# Patient Record
Sex: Female | Born: 1985 | Race: Black or African American | Hispanic: No | Marital: Married | Smoking: Former smoker
Health system: Southern US, Community
[De-identification: ages and names within clinical notes are randomized; demographics above are authoritative.]

## PROBLEM LIST (undated history)

## (undated) ENCOUNTER — Inpatient Hospital Stay (HOSPITAL_COMMUNITY): Payer: Self-pay

## (undated) DIAGNOSIS — G35 Multiple sclerosis: Secondary | ICD-10-CM

## (undated) DIAGNOSIS — F329 Major depressive disorder, single episode, unspecified: Secondary | ICD-10-CM

## (undated) DIAGNOSIS — F32A Depression, unspecified: Secondary | ICD-10-CM

## (undated) HISTORY — PX: OTHER SURGICAL HISTORY: SHX169

---
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
## 2004-07-26 ENCOUNTER — Inpatient Hospital Stay (HOSPITAL_COMMUNITY): Admission: AD | Admit: 2004-07-26 | Discharge: 2004-07-29 | Payer: Self-pay | Admitting: Obstetrics

## 2004-08-03 ENCOUNTER — Ambulatory Visit: Admission: RE | Admit: 2004-08-03 | Discharge: 2004-08-03 | Payer: Self-pay | Admitting: Obstetrics

## 2004-09-03 ENCOUNTER — Encounter: Admission: RE | Admit: 2004-09-03 | Discharge: 2004-10-03 | Payer: Self-pay | Admitting: Obstetrics

## 2004-10-04 ENCOUNTER — Encounter: Admission: RE | Admit: 2004-10-04 | Discharge: 2004-11-03 | Payer: Self-pay | Admitting: Obstetrics

## 2004-12-02 ENCOUNTER — Encounter: Admission: RE | Admit: 2004-12-02 | Discharge: 2005-01-01 | Payer: Self-pay | Admitting: Obstetrics

## 2005-02-01 ENCOUNTER — Encounter: Admission: RE | Admit: 2005-02-01 | Discharge: 2005-03-03 | Payer: Self-pay | Admitting: Obstetrics

## 2006-05-20 ENCOUNTER — Emergency Department (HOSPITAL_COMMUNITY): Admission: EM | Admit: 2006-05-20 | Discharge: 2006-05-21 | Payer: Self-pay | Admitting: Emergency Medicine

## 2006-05-20 ENCOUNTER — Emergency Department (HOSPITAL_COMMUNITY): Admission: EM | Admit: 2006-05-20 | Discharge: 2006-05-20 | Payer: Self-pay | Admitting: Emergency Medicine

## 2006-07-04 ENCOUNTER — Inpatient Hospital Stay (HOSPITAL_COMMUNITY): Admission: AD | Admit: 2006-07-04 | Discharge: 2006-07-04 | Payer: Self-pay | Admitting: Obstetrics

## 2007-02-02 ENCOUNTER — Inpatient Hospital Stay (HOSPITAL_COMMUNITY): Admission: AD | Admit: 2007-02-02 | Discharge: 2007-02-02 | Payer: Self-pay | Admitting: Obstetrics

## 2007-02-03 ENCOUNTER — Inpatient Hospital Stay (HOSPITAL_COMMUNITY): Admission: AD | Admit: 2007-02-03 | Discharge: 2007-02-03 | Payer: Self-pay | Admitting: Obstetrics

## 2007-02-05 ENCOUNTER — Inpatient Hospital Stay (HOSPITAL_COMMUNITY): Admission: AD | Admit: 2007-02-05 | Discharge: 2007-02-07 | Payer: Self-pay | Admitting: Obstetrics

## 2007-08-20 ENCOUNTER — Emergency Department (HOSPITAL_COMMUNITY): Admission: EM | Admit: 2007-08-20 | Discharge: 2007-08-21 | Payer: Self-pay | Admitting: Emergency Medicine

## 2007-12-06 ENCOUNTER — Emergency Department (HOSPITAL_COMMUNITY): Admission: EM | Admit: 2007-12-06 | Discharge: 2007-12-06 | Payer: Self-pay | Admitting: Family Medicine

## 2008-01-07 ENCOUNTER — Inpatient Hospital Stay (HOSPITAL_COMMUNITY): Admission: AD | Admit: 2008-01-07 | Discharge: 2008-01-07 | Payer: Self-pay | Admitting: Obstetrics

## 2008-02-26 ENCOUNTER — Inpatient Hospital Stay (HOSPITAL_COMMUNITY): Admission: AD | Admit: 2008-02-26 | Discharge: 2008-02-26 | Payer: Self-pay | Admitting: Obstetrics

## 2008-04-16 ENCOUNTER — Inpatient Hospital Stay (HOSPITAL_COMMUNITY): Admission: AD | Admit: 2008-04-16 | Discharge: 2008-04-16 | Payer: Self-pay | Admitting: Obstetrics

## 2008-07-05 ENCOUNTER — Inpatient Hospital Stay (HOSPITAL_COMMUNITY): Admission: AD | Admit: 2008-07-05 | Discharge: 2008-07-07 | Payer: Self-pay | Admitting: Obstetrics

## 2008-07-06 ENCOUNTER — Encounter (INDEPENDENT_AMBULATORY_CARE_PROVIDER_SITE_OTHER): Payer: Self-pay | Admitting: Obstetrics

## 2010-04-12 ENCOUNTER — Emergency Department (HOSPITAL_COMMUNITY): Admission: EM | Admit: 2010-04-12 | Discharge: 2010-04-12 | Payer: Self-pay | Admitting: Emergency Medicine

## 2010-05-11 ENCOUNTER — Emergency Department (HOSPITAL_COMMUNITY): Admission: EM | Admit: 2010-05-11 | Discharge: 2010-05-11 | Payer: Self-pay | Admitting: Family Medicine

## 2010-09-06 ENCOUNTER — Emergency Department (HOSPITAL_COMMUNITY)
Admission: EM | Admit: 2010-09-06 | Discharge: 2010-09-06 | Payer: Self-pay | Source: Home / Self Care | Admitting: Emergency Medicine

## 2011-02-16 ENCOUNTER — Inpatient Hospital Stay (INDEPENDENT_AMBULATORY_CARE_PROVIDER_SITE_OTHER)
Admission: RE | Admit: 2011-02-16 | Discharge: 2011-02-16 | Disposition: A | Discharge status: Home / Self Care | Payer: Medicaid Other | Source: Ambulatory Visit | Attending: Emergency Medicine | Admitting: Emergency Medicine

## 2011-02-16 DIAGNOSIS — M549 Dorsalgia, unspecified: Secondary | ICD-10-CM

## 2011-02-16 DIAGNOSIS — M79609 Pain in unspecified limb: Secondary | ICD-10-CM

## 2011-02-16 DIAGNOSIS — R300 Dysuria: Secondary | ICD-10-CM

## 2011-02-16 LAB — HEPATITIS C ANTIBODY: HCV Ab: NEGATIVE

## 2011-02-16 LAB — HEPATIC FUNCTION PANEL
ALT: 17 U/L (ref 0–35)
AST: 15 U/L (ref 0–37)
Albumin: 3.6 g/dL (ref 3.5–5.2)
Total Protein: 7.4 g/dL (ref 6.0–8.3)

## 2011-02-16 LAB — POCT URINALYSIS DIP (DEVICE)
Hgb urine dipstick: NEGATIVE
Nitrite: NEGATIVE
Specific Gravity, Urine: >=1.03 (ref 1.005–1.030)
pH: 6.5 (ref 5.0–8.0)

## 2011-02-16 LAB — URINALYSIS, MICROSCOPIC ONLY
Glucose, UA: NEGATIVE mg/dL
Hgb urine dipstick: NEGATIVE
Leukocytes, UA: NEGATIVE
Specific Gravity, Urine: 1.021 (ref 1.005–1.030)
pH: 6.5 (ref 5.0–8.0)

## 2011-05-02 LAB — URINALYSIS, ROUTINE W REFLEX MICROSCOPIC
Bilirubin Urine: NEGATIVE
Glucose, UA: NEGATIVE
Protein, ur: NEGATIVE
Specific Gravity, Urine: 1.028
Urobilinogen, UA: 1

## 2011-05-02 LAB — RPR: RPR Ser Ql: NONREACTIVE

## 2011-05-02 LAB — URINE MICROSCOPIC-ADD ON

## 2011-05-02 LAB — WET PREP, GENITAL
Trich, Wet Prep: NONE SEEN
Yeast Wet Prep HPF POC: NONE SEEN

## 2011-05-02 LAB — GC/CHLAMYDIA PROBE AMP, GENITAL
Chlamydia, DNA Probe: NEGATIVE
GC Probe Amp, Genital: NEGATIVE

## 2011-05-02 LAB — POCT PREGNANCY, URINE
Operator id: 161631
Preg Test, Ur: NEGATIVE

## 2011-05-07 LAB — POCT PREGNANCY, URINE: Preg Test, Ur: POSITIVE

## 2011-05-07 LAB — URINE CULTURE
Colony Count: NO GROWTH
Culture: NO GROWTH

## 2011-05-07 LAB — POCT URINALYSIS DIP (DEVICE)
Glucose, UA: NEGATIVE
Hgb urine dipstick: NEGATIVE
Specific Gravity, Urine: 1.02
pH: 7.5

## 2011-05-10 LAB — URINALYSIS, ROUTINE W REFLEX MICROSCOPIC
Ketones, ur: NEGATIVE
Nitrite: NEGATIVE
Protein, ur: NEGATIVE
Urobilinogen, UA: 0.2

## 2011-05-14 LAB — CBC
HCT: 29.2 — ABNORMAL LOW
MCHC: 33.6
Platelets: 146 — ABNORMAL LOW
RBC: 2.99 — ABNORMAL LOW
RDW: 14.4
WBC: 7.9
WBC: 9.2

## 2011-05-14 LAB — RPR: RPR Ser Ql: NONREACTIVE

## 2011-05-15 LAB — URINALYSIS, ROUTINE W REFLEX MICROSCOPIC
Glucose, UA: NEGATIVE
Hgb urine dipstick: NEGATIVE
Ketones, ur: >80 — AB
Protein, ur: NEGATIVE
Urobilinogen, UA: 1

## 2011-05-15 LAB — WET PREP, GENITAL
Clue Cells Wet Prep HPF POC: NONE SEEN
Trich, Wet Prep: NONE SEEN

## 2011-05-19 ENCOUNTER — Emergency Department (HOSPITAL_COMMUNITY)
Admission: EM | Admit: 2011-05-19 | Discharge: 2011-05-19 | Disposition: A | Discharge status: Home / Self Care | Payer: Medicaid Other | Attending: Emergency Medicine | Admitting: Emergency Medicine

## 2011-05-19 ENCOUNTER — Emergency Department (HOSPITAL_COMMUNITY): Payer: Medicaid Other

## 2011-05-19 DIAGNOSIS — Y92009 Unspecified place in unspecified non-institutional (private) residence as the place of occurrence of the external cause: Secondary | ICD-10-CM | POA: Insufficient documentation

## 2011-05-19 DIAGNOSIS — W268XXA Contact with other sharp object(s), not elsewhere classified, initial encounter: Secondary | ICD-10-CM | POA: Insufficient documentation

## 2011-05-19 DIAGNOSIS — F3130 Bipolar disorder, current episode depressed, mild or moderate severity, unspecified: Secondary | ICD-10-CM | POA: Insufficient documentation

## 2011-05-19 DIAGNOSIS — S61409A Unspecified open wound of unspecified hand, initial encounter: Secondary | ICD-10-CM | POA: Insufficient documentation

## 2011-05-22 ENCOUNTER — Inpatient Hospital Stay (INDEPENDENT_AMBULATORY_CARE_PROVIDER_SITE_OTHER)
Admission: RE | Admit: 2011-05-22 | Discharge: 2011-05-22 | Disposition: A | Discharge status: Home / Self Care | Payer: Medicaid Other | Source: Ambulatory Visit | Attending: Family Medicine | Admitting: Family Medicine

## 2011-05-22 DIAGNOSIS — S61209A Unspecified open wound of unspecified finger without damage to nail, initial encounter: Secondary | ICD-10-CM

## 2011-05-29 LAB — CBC
HCT: 26.5 — ABNORMAL LOW
Hemoglobin: 9.9 — ABNORMAL LOW
MCHC: 33.2
MCV: 86
RBC: 3.08 — ABNORMAL LOW
RBC: 3.53 — ABNORMAL LOW
WBC: 9.3

## 2012-06-07 ENCOUNTER — Inpatient Hospital Stay (HOSPITAL_COMMUNITY)
Admission: AD | Admit: 2012-06-07 | Discharge: 2012-06-07 | Disposition: A | Discharge status: Home / Self Care | Payer: Medicaid Other | Source: Ambulatory Visit | Attending: Obstetrics | Admitting: Obstetrics

## 2012-06-07 ENCOUNTER — Inpatient Hospital Stay (HOSPITAL_COMMUNITY): Payer: Medicaid Other

## 2012-06-07 ENCOUNTER — Encounter (HOSPITAL_COMMUNITY): Payer: Self-pay | Admitting: *Deleted

## 2012-06-07 DIAGNOSIS — R109 Unspecified abdominal pain: Secondary | ICD-10-CM | POA: Insufficient documentation

## 2012-06-07 DIAGNOSIS — O99891 Other specified diseases and conditions complicating pregnancy: Secondary | ICD-10-CM | POA: Insufficient documentation

## 2012-06-07 DIAGNOSIS — M545 Low back pain, unspecified: Secondary | ICD-10-CM | POA: Insufficient documentation

## 2012-06-07 DIAGNOSIS — Z3490 Encounter for supervision of normal pregnancy, unspecified, unspecified trimester: Secondary | ICD-10-CM

## 2012-06-07 DIAGNOSIS — Z331 Pregnant state, incidental: Secondary | ICD-10-CM

## 2012-06-07 LAB — URINALYSIS, ROUTINE W REFLEX MICROSCOPIC
Bilirubin Urine: NEGATIVE
Glucose, UA: NEGATIVE mg/dL
Hgb urine dipstick: NEGATIVE
Ketones, ur: 15 mg/dL — AB
pH: 8.5 — ABNORMAL HIGH (ref 5.0–8.0)

## 2012-06-07 LAB — WET PREP, GENITAL
Trich, Wet Prep: NONE SEEN
Yeast Wet Prep HPF POC: NONE SEEN

## 2012-06-07 NOTE — MAU Note (Signed)
Pt c/o apin in her lower abd and back  That she has had off and on for the past 2 weeks. Worse at night. Denies vag bleeding or discharge. Implanade taken out in march still did not have a period since it has been taken out. Took HPT and it was positive.

## 2012-06-07 NOTE — MAU Provider Note (Signed)
History     CSN: [id number]  Arrival date & time 06/07/12  1407   None     Chief Complaint  Patient presents with  . Abdominal Pain    (Consider location/radiation/quality/duration/timing/severity/associated sxs/prior treatment) HPI Alicia Barker is a 26 y.o. [id number]. She had Nexplanon removed in March, no period yet. Has been having low abd and low back pain off/on x 2 wks, did home UPT, was positive.  No bleeding, change in discharge, UTI S&S or GI changes.  History reviewed. No pertinent past medical history.  History reviewed. No pertinent past surgical history.  History reviewed. No pertinent family history.  History  Substance Use Topics  . Smoking status: Former Games developer  . Smokeless tobacco: Not on file  . Alcohol Use:     OB History    Grav Para Term Preterm Abortions TAB SAB Ect Mult Living   5 4 3 1  0 0 0 0 0 4      Review of Systems  Constitutional: Positive for fatigue. Negative for fever and chills.  Gastrointestinal: Positive for abdominal pain.  Genitourinary: Negative for dysuria, urgency, frequency, vaginal bleeding and vaginal discharge.  Musculoskeletal: Positive for back pain.    Allergies  Penicillins  Home Medications  No current outpatient prescriptions on file.  BP 117/69  Pulse 72  Temp 97.9 F (36.6 C) (Oral)  Resp 18  Ht 5\' 6"  (1.676 m)  Wt 193 lb 3.2 oz (87.635 kg)  BMI 31.18 kg/m2  Physical Exam  Constitutional: She is oriented to person, place, and time. She appears well-developed and well-nourished.  Abdominal: There is tenderness. There is no rebound and no guarding.  Musculoskeletal: Normal range of motion.  Neurological: She is alert and oriented to person, place, and time.  Skin: Skin is warm and dry.  Psychiatric: She has a normal mood and affect. Her behavior is normal.    ED Course  Procedures (including critical care time)  Labs Reviewed  URINALYSIS, ROUTINE W REFLEX MICROSCOPIC - Abnormal; Notable for  the following:    APPearance HAZY (*)     pH 8.5 (*)     Ketones, ur 15 (*)     All other components within normal limits  POCT PREGNANCY, URINE - Abnormal; Notable for the following:    Preg Test, Ur POSITIVE (*)     All other components within normal limits   No results found. Results for orders placed during the hospital encounter of 06/07/12 (from the past 24 hour(s))  URINALYSIS, ROUTINE W REFLEX MICROSCOPIC     Status: Abnormal   Collection Time   06/07/12  2:13 PM      Component Value Range   Color, Urine YELLOW  YELLOW   APPearance HAZY (*) CLEAR   Specific Gravity, Urine 1.020  1.005 - 1.030   pH 8.5 (*) 5.0 - 8.0   Glucose, UA NEGATIVE  NEGATIVE mg/dL   Hgb urine dipstick NEGATIVE  NEGATIVE   Bilirubin Urine NEGATIVE  NEGATIVE   Ketones, ur 15 (*) NEGATIVE mg/dL   Protein, ur NEGATIVE  NEGATIVE mg/dL   Urobilinogen, UA 1.0  0.0 - 1.0 mg/dL   Nitrite NEGATIVE  NEGATIVE   Leukocytes, UA NEGATIVE  NEGATIVE  POCT PREGNANCY, URINE     Status: Abnormal   Collection Time   06/07/12  2:32 PM      Component Value Range   Preg Test, Ur POSITIVE (*) NEGATIVE  WET PREP, GENITAL     Status: Abnormal  Collection Time   06/07/12  4:20 PM      Component Value Range   Yeast Wet Prep HPF POC NONE SEEN  NONE SEEN   Trich, Wet Prep NONE SEEN  NONE SEEN   Clue Cells Wet Prep HPF POC FEW (*) NONE SEEN   WBC, Wet Prep HPF POC FEW (*) NONE SEEN   US Ob Limited  06/07/2012  *RADIOLOGY REPORT*  Clinical Data: 26 year old pregnant female with abdominal and pelvic pain.  LIMITED OBSTETRIC ULTRASOUND  Number of Fetuses: 1 Heart Rate: 146 bpm Movement: Present Presentation: Breech Placental Location: Anterior Previa: No Amniotic Fluid (Subjective): Normal  Vertical pocket:  5.6cm  BPD: 3.9cm   18w   0d   EDC: 11/08/2012.  MATERNAL FINDINGS: Cervix: 3.4 cm and closed. Uterus/Adnexae: Ovaries bilaterally are unremarkable. There is no evidence of free fluid or adnexal mass.  IMPRESSION: Single  living intrauterine gestation with estimated gestational age of [redacted] weeks 0 days by this ultrasound.  Recommend followup with non-emergent complete OB 14+ wk US examination for fetal biometric evaluation and anatomic survey if not already performed.   Original Report Authenticated By: Rosendo Gros, M.D.      No diagnosis found. ASSESSMENT:  18 wk pregnancy, nl exam and labs   PLAN:  Call Dr Elsie Stain office for an appt top start prenatal care.  MDM

## 2012-06-09 LAB — GC/CHLAMYDIA PROBE AMP, GENITAL
Chlamydia, DNA Probe: POSITIVE — AB
GC Probe Amp, Genital: POSITIVE — AB

## 2012-06-10 ENCOUNTER — Telehealth (HOSPITAL_COMMUNITY): Payer: Self-pay | Admitting: Obstetrics and Gynecology

## 2012-06-10 NOTE — Telephone Encounter (Signed)
Patient returned call, notified her of positive cultures.  Instructed patient to schedule treatment.  Patient plans to be treated with Dr. Gaynell Face.  Instructed patient to notify her partner for treatment.

## 2012-06-10 NOTE — Telephone Encounter (Signed)
Telephone call to patient regarding positive GC and chlamydia cultures, patient not in, left message for her to call.  Patient has not been treated and will need referral to [facility] STD clinic at [phone].  Report faxed to health department.

## 2012-08-12 NOTE — L&D Delivery Note (Signed)
Delivery Note At 12:34 AM a viable female was delivered via Vaginal, Spontaneous Delivery (Presentation: ; Occiput Anterior).  APGAR: , ; weight .   Placenta status: Intact, Spontaneous.  Cord:  with the following complications: .  Cord pH: not done  Anesthesia: Epidural  Episiotomy: None Lacerations: None Suture Repair: 2.0 Est. Blood Loss (mL): 250  Mom to postpartum.  Baby to nursery-stable.  MARSHALL,BERNARD A 10/30/2012, 12:46 AM

## 2012-08-25 LAB — OB RESULTS CONSOLE ANTIBODY SCREEN: Antibody Screen: NEGATIVE

## 2012-08-25 LAB — OB RESULTS CONSOLE HEPATITIS B SURFACE ANTIGEN: Hepatitis B Surface Ag: NEGATIVE

## 2012-08-25 LAB — OB RESULTS CONSOLE ABO/RH: RH Type: POSITIVE

## 2012-08-25 LAB — OB RESULTS CONSOLE RPR: RPR: NONREACTIVE

## 2012-10-29 ENCOUNTER — Inpatient Hospital Stay (HOSPITAL_COMMUNITY): Payer: Medicaid Other | Admitting: Anesthesiology

## 2012-10-29 ENCOUNTER — Encounter (HOSPITAL_COMMUNITY): Payer: Self-pay | Admitting: Anesthesiology

## 2012-10-29 ENCOUNTER — Encounter (HOSPITAL_COMMUNITY): Payer: Self-pay | Admitting: *Deleted

## 2012-10-29 ENCOUNTER — Inpatient Hospital Stay (HOSPITAL_COMMUNITY)
Admission: AD | Admit: 2012-10-29 | Discharge: 2012-10-31 | DRG: 775 | Disposition: A | Discharge status: Home / Self Care | Payer: Medicaid Other | Source: Ambulatory Visit | Attending: Obstetrics | Admitting: Obstetrics

## 2012-10-29 DIAGNOSIS — Z22330 Carrier of Group B streptococcus: Secondary | ICD-10-CM

## 2012-10-29 DIAGNOSIS — O99892 Other specified diseases and conditions complicating childbirth: Principal | ICD-10-CM | POA: Diagnosis present

## 2012-10-29 LAB — CBC
Hemoglobin: 10.5 g/dL — ABNORMAL LOW (ref 12.0–15.0)
MCH: 29.9 pg (ref 26.0–34.0)
MCHC: 34.2 g/dL (ref 30.0–36.0)
Platelets: 158 10*3/uL (ref 150–400)

## 2012-10-29 MED ORDER — CITRIC ACID-SODIUM CITRATE 334-500 MG/5ML PO SOLN: 30.0000 mL | ORAL | Status: DC | PRN

## 2012-10-29 MED ORDER — ACETAMINOPHEN 325 MG PO TABS: 650.0000 mg | ORAL_TABLET | ORAL | Status: DC | PRN

## 2012-10-29 MED ORDER — OXYCODONE-ACETAMINOPHEN 5-325 MG PO TABS
1.0000 | ORAL_TABLET | ORAL | Status: DC | PRN
Start: 2012-10-29 — End: 2012-10-30

## 2012-10-29 MED ORDER — OXYTOCIN 40 UNITS IN LACTATED RINGERS INFUSION - SIMPLE MED
62.5000 mL/h | INTRAVENOUS | Status: DC
  Administered 2012-10-30: 62.5 mL/h via INTRAVENOUS
  Filled 2012-10-29: qty 1000

## 2012-10-29 MED ORDER — BUTORPHANOL TARTRATE 1 MG/ML IJ SOLN: 1.0000 mg | INTRAMUSCULAR | Status: DC | PRN

## 2012-10-29 MED ORDER — LACTATED RINGERS IV SOLN: 500.0000 mL | INTRAVENOUS | Status: DC | PRN

## 2012-10-29 MED ORDER — LIDOCAINE HCL (PF) 1 % IJ SOLN
30.0000 mL | INTRAMUSCULAR | Status: DC | PRN
  Filled 2012-10-29: qty 30

## 2012-10-29 MED ORDER — EPHEDRINE 5 MG/ML INJ
10.0000 mg | INTRAVENOUS | Status: DC | PRN
  Filled 2012-10-29: qty 4

## 2012-10-29 MED ORDER — IBUPROFEN 600 MG PO TABS: 600.0000 mg | ORAL_TABLET | Freq: Four times a day (QID) | ORAL | Status: DC | PRN

## 2012-10-29 MED ORDER — DIPHENHYDRAMINE HCL 50 MG/ML IJ SOLN: 12.5000 mg | INTRAMUSCULAR | Status: DC | PRN

## 2012-10-29 MED ORDER — FENTANYL 2.5 MCG/ML BUPIVACAINE 1/10 % EPIDURAL INFUSION (WH - ANES)
INTRAMUSCULAR | Status: DC | PRN
  Administered 2012-10-29: 14 mL/h via EPIDURAL

## 2012-10-29 MED ORDER — OXYTOCIN BOLUS FROM INFUSION: 500.0000 mL | INTRAVENOUS | Status: DC

## 2012-10-29 MED ORDER — ONDANSETRON HCL 4 MG/2ML IJ SOLN: 4.0000 mg | Freq: Four times a day (QID) | INTRAMUSCULAR | Status: DC | PRN

## 2012-10-29 MED ORDER — FENTANYL 2.5 MCG/ML BUPIVACAINE 1/10 % EPIDURAL INFUSION (WH - ANES)
14.0000 mL/h | INTRAMUSCULAR | Status: DC | PRN
  Filled 2012-10-29: qty 125

## 2012-10-29 MED ORDER — EPHEDRINE 5 MG/ML INJ: 10.0000 mg | INTRAVENOUS | Status: DC | PRN

## 2012-10-29 MED ORDER — LACTATED RINGERS IV SOLN
INTRAVENOUS | Status: DC
  Administered 2012-10-29: 23:00:00 via INTRAVENOUS

## 2012-10-29 MED ORDER — LIDOCAINE HCL (PF) 1 % IJ SOLN
INTRAMUSCULAR | Status: DC | PRN
  Administered 2012-10-29: 8 mL
  Administered 2012-10-29: 9 mL

## 2012-10-29 MED ORDER — LACTATED RINGERS IV SOLN
500.0000 mL | Freq: Once | INTRAVENOUS | Status: AC
  Administered 2012-10-29: 500 mL via INTRAVENOUS

## 2012-10-29 MED ORDER — CLINDAMYCIN PHOSPHATE 900 MG/50ML IV SOLN
900.0000 mg | Freq: Three times a day (TID) | INTRAVENOUS | Status: DC
  Administered 2012-10-29: 900 mg via INTRAVENOUS
  Filled 2012-10-29 (×3): qty 50

## 2012-10-29 MED ORDER — PHENYLEPHRINE 40 MCG/ML (10ML) SYRINGE FOR IV PUSH (FOR BLOOD PRESSURE SUPPORT)
80.0000 ug | PREFILLED_SYRINGE | INTRAVENOUS | Status: DC | PRN
  Filled 2012-10-29: qty 5

## 2012-10-29 MED ORDER — PHENYLEPHRINE 40 MCG/ML (10ML) SYRINGE FOR IV PUSH (FOR BLOOD PRESSURE SUPPORT): 80.0000 ug | PREFILLED_SYRINGE | INTRAVENOUS | Status: DC | PRN

## 2012-10-29 NOTE — Anesthesia Preprocedure Evaluation (Signed)
Anesthesia Evaluation  Patient identified by MRN, date of birth, ID band Patient awake    Reviewed: Allergy & Precautions, H&P , NPO status , Patient's Chart, lab work & pertinent test results  Airway Mallampati: II TM Distance: >3 FB Neck ROM: full    Dental no notable dental hx.    Pulmonary neg pulmonary ROS,    Pulmonary exam normal       Cardiovascular negative cardio ROS      Neuro/Psych negative neurological ROS  negative psych ROS   GI/Hepatic negative GI ROS, Neg liver ROS,   Endo/Other  Morbid obesity  Renal/GU negative Renal ROS  negative genitourinary   Musculoskeletal   Abdominal Normal abdominal exam  (+)   Peds negative pediatric ROS (+)  Hematology negative hematology ROS (+)   Anesthesia Other Findings   Reproductive/Obstetrics (+) Pregnancy                           Anesthesia Physical Anesthesia Plan  ASA: III  Anesthesia Plan: Epidural   Post-op Pain Management:    Induction:   Airway Management Planned:   Additional Equipment:   Intra-op Plan:   Post-operative Plan:   Informed Consent: I have reviewed the patients History and Physical, chart, labs and discussed the procedure including the risks, benefits and alternatives for the proposed anesthesia with the patient or authorized representative who has indicated his/her understanding and acceptance.     Plan Discussed with:   Anesthesia Plan Comments:         Anesthesia Quick Evaluation

## 2012-10-29 NOTE — MAU Note (Signed)
Pt G5 P4 at 38.4wks having contractions x 1 hr.  Seen today in the office SVE 2cm.  Denies bleeding or leaking.  GBS +

## 2012-10-29 NOTE — Anesthesia Procedure Notes (Signed)
Epidural Patient location during procedure: OB Start time: 10/29/2012 10:36 PM End time: 10/29/2012 10:40 PM  Staffing Anesthesiologist: Sandrea Hughs Performed by: anesthesiologist   Preanesthetic Checklist Completed: patient identified, site marked, surgical consent, pre-op evaluation, timeout performed, IV checked, risks and benefits discussed and monitors and equipment checked  Epidural Patient position: sitting Prep: site prepped and draped and DuraPrep Patient monitoring: continuous pulse ox and blood pressure Approach: midline Injection technique: LOR air  Needle:  Needle type: Tuohy  Needle gauge: 17 G Needle length: 9 cm and 9 Needle insertion depth: 6 cm Catheter type: closed end flexible Catheter size: 19 Gauge Catheter at skin depth: 12 cm Test dose: negative and Other  Assessment Sensory level: T8 Events: blood not aspirated, injection not painful, no injection resistance, negative IV test and no paresthesia  Additional Notes Reason for block:procedure for pain

## 2012-10-30 ENCOUNTER — Encounter (HOSPITAL_COMMUNITY): Payer: Self-pay | Admitting: *Deleted

## 2012-10-30 LAB — CBC
HCT: 29 % — ABNORMAL LOW (ref 36.0–46.0)
Platelets: 152 10*3/uL (ref 150–400)
RBC: 3.3 MIL/uL — ABNORMAL LOW (ref 3.87–5.11)
RDW: 13.5 % (ref 11.5–15.5)
WBC: 10.2 10*3/uL (ref 4.0–10.5)

## 2012-10-30 LAB — ABO/RH: ABO/RH(D): A POS

## 2012-10-30 LAB — RPR: RPR Ser Ql: NONREACTIVE

## 2012-10-30 MED ORDER — PRENATAL MULTIVITAMIN CH
1.0000 | ORAL_TABLET | Freq: Every day | ORAL | Status: DC
  Administered 2012-10-30 – 2012-10-31 (×2): 1 via ORAL
  Filled 2012-10-30 (×2): qty 1

## 2012-10-30 MED ORDER — SIMETHICONE 80 MG PO CHEW: 80.0000 mg | CHEWABLE_TABLET | ORAL | Status: DC | PRN

## 2012-10-30 MED ORDER — SENNOSIDES-DOCUSATE SODIUM 8.6-50 MG PO TABS
2.0000 | ORAL_TABLET | Freq: Every day | ORAL | Status: DC
  Administered 2012-10-30: 2 via ORAL

## 2012-10-30 MED ORDER — ZOLPIDEM TARTRATE 5 MG PO TABS: 5.0000 mg | ORAL_TABLET | Freq: Every evening | ORAL | Status: DC | PRN

## 2012-10-30 MED ORDER — ONDANSETRON HCL 4 MG/2ML IJ SOLN: 4.0000 mg | INTRAMUSCULAR | Status: DC | PRN

## 2012-10-30 MED ORDER — DIPHENHYDRAMINE HCL 25 MG PO CAPS
25.0000 mg | ORAL_CAPSULE | Freq: Four times a day (QID) | ORAL | Status: DC | PRN
  Administered 2012-10-30: 25 mg via ORAL
  Filled 2012-10-30: qty 1

## 2012-10-30 MED ORDER — TETANUS-DIPHTH-ACELL PERTUSSIS 5-2.5-18.5 LF-MCG/0.5 IM SUSP: 0.5000 mL | Freq: Once | INTRAMUSCULAR | Status: DC

## 2012-10-30 MED ORDER — BENZOCAINE-MENTHOL 20-0.5 % EX AERO
1.0000 "application " | INHALATION_SPRAY | CUTANEOUS | Status: DC | PRN
  Administered 2012-10-30: 1 via TOPICAL
  Filled 2012-10-30 (×2): qty 56

## 2012-10-30 MED ORDER — LANOLIN HYDROUS EX OINT: TOPICAL_OINTMENT | CUTANEOUS | Status: DC | PRN

## 2012-10-30 MED ORDER — METHYLERGONOVINE MALEATE 0.2 MG PO TABS: 0.2000 mg | ORAL_TABLET | ORAL | Status: DC | PRN

## 2012-10-30 MED ORDER — DIBUCAINE 1 % RE OINT: 1.0000 "application " | TOPICAL_OINTMENT | RECTAL | Status: DC | PRN

## 2012-10-30 MED ORDER — FERROUS SULFATE 325 (65 FE) MG PO TABS
325.0000 mg | ORAL_TABLET | Freq: Two times a day (BID) | ORAL | Status: DC
  Administered 2012-10-30 – 2012-10-31 (×2): 325 mg via ORAL
  Filled 2012-10-30 (×3): qty 1

## 2012-10-30 MED ORDER — OXYCODONE-ACETAMINOPHEN 5-325 MG PO TABS
1.0000 | ORAL_TABLET | ORAL | Status: DC | PRN
  Administered 2012-10-30 – 2012-10-31 (×3): 1 via ORAL
  Filled 2012-10-30 (×3): qty 1

## 2012-10-30 MED ORDER — ONDANSETRON HCL 4 MG PO TABS: 4.0000 mg | ORAL_TABLET | ORAL | Status: DC | PRN

## 2012-10-30 MED ORDER — METHYLERGONOVINE MALEATE 0.2 MG/ML IJ SOLN: 0.2000 mg | INTRAMUSCULAR | Status: DC | PRN

## 2012-10-30 MED ORDER — WITCH HAZEL-GLYCERIN EX PADS: 1.0000 "application " | MEDICATED_PAD | CUTANEOUS | Status: DC | PRN

## 2012-10-30 MED ORDER — IBUPROFEN 600 MG PO TABS
600.0000 mg | ORAL_TABLET | Freq: Four times a day (QID) | ORAL | Status: DC
  Administered 2012-10-30 – 2012-10-31 (×6): 600 mg via ORAL
  Filled 2012-10-30 (×6): qty 1

## 2012-10-30 NOTE — Progress Notes (Signed)
Reported to me this morning at 0730 that patient was possibly to be scheduled for post-partum tubal ligation. No orders in chart, but IV access and epidural catheter left in place and patient has been NPO since 0100. Dr Clearance Coots on call for Dr. Gaynell Face and he said tubal ligation would be done as out-patient at 6 weeks post-partum.  Anesthesia called to discontinue epidural catheter and patient allowed to eat.

## 2012-10-30 NOTE — H&P (Signed)
This is Dr. Francoise Ceo dictating the history and physical on Alicia Barker   she's a 27 year old gravida 5 para 10/11/2002 positive GBS 38 weeks and 5 days EDC 11/08/2008 admitted in labor 4 cm 80% vertex -1 amniotomy performed the fluid clear and her GBS was treated with clindamycin 900 every 8 hours Past medical history negative Past surgical history negative Social history negative System review negative Physical exam well-developed female in labor HEENT negative Lungs clear to P&A Heart regular rhythm no murmurs no gallops Abdomen term Pelvic as described above Extremities negative

## 2012-10-30 NOTE — Progress Notes (Signed)
UR completed 

## 2012-10-30 NOTE — Anesthesia Postprocedure Evaluation (Signed)
  Anesthesia Post-op Note  Patient: Alicia Barker  Procedure(s) Performed: * No procedures listed *  Patient Location: PACU and Mother/Baby  Anesthesia Type:Epidural  Level of Consciousness: awake, alert  and oriented  Airway and Oxygen Therapy: Patient Spontanous Breathing  Post-op Pain: none  Post-op Assessment: Post-op Vital signs reviewed and Patient's Cardiovascular Status Stable  Post-op Vital Signs: Reviewed and stable  Complications: No apparent anesthesia complications

## 2012-10-31 MED ORDER — IBUPROFEN 600 MG PO TABS: 600.0000 mg | ORAL_TABLET | Freq: Four times a day (QID) | ORAL | Status: DC | PRN

## 2012-10-31 MED ORDER — OXYCODONE-ACETAMINOPHEN 5-325 MG PO TABS: 1.0000 | ORAL_TABLET | ORAL | Status: DC | PRN

## 2012-10-31 NOTE — Discharge Instructions (Signed)
Before Baby Comes Home °Ask any questions about feeding, diapering, and baby care before you leave the hospital. Ask again if you do not understand. Ask when you need to see the doctor again. °There are several things you must have before your baby comes home. °· Infant car seat. °· Crib. °· Do not let your baby sleep in a bed with you or anyone else. °· If you do not have a bed for your baby, ask the doctor what you can use that will be safe for the baby to sleep in. °Infant feeding supplies: °· 6 to 8 bottles (8 oz. size). °· 6 to 8 nipples. °· Measuring cup. °· Measuring tablespoon. °· Bottle brush. °· Sterilizer (or use any large pan or kettle with a lid). °· Formula that contains iron. °· A way to boil and cool water. °Breastfeeding supplies: °· Breast pump. °· Nipple cream. °Clothing: °· 24 to 36 cloth diapers and waterproof diaper covers or a box of disposable diapers. You may need as many as 10 to 12 diapers per day. °· 3 onesies (other clothing will depend on the time of year and the weather). °· 3 receiving blankets. °· 3 baby pajamas or gowns. °· 3 bibs. °Bath equipment: °· Mild soap. °· Petroleum jelly. No baby oil or powder. °· Soft cloth towel and wash cloth. °· Cotton balls. °· Separate bath basin for baby. Only sponge bathe until umbilical cord and circumcision are healed. °Other supplies: °· Thermometer and bulb syringe (ask the hospital to send them home with you). Ask your doctor about how you should take your baby's temperature. °· One to two pacifiers. °Prepare for an emergency: °· Know how to get to the hospital and know where to admit your baby. °· Put all doctor numbers near your house phone and in your cell phone if you have one. °Prepare your family: °· Talk with siblings about the baby coming home and how they feel about it. °· Decide how you want to handle visitors and other family members. °· Take offers for help with the baby. You will need time to adjust. °Know when to call the doctor.   °GET HELP RIGHT AWAY IF: °· Your baby's temperature is greater than 100.4° F (38° C). °· The softspot on your baby's head starts to bulge. °· Your baby is crying with no tears or has no wet diapers for 6 hours. °· Your baby has rapid breathing. °· Your baby is not as alert. °Document Released: 07/11/2008 Document Revised: 10/21/2011 Document Reviewed: 10/18/2010 °Exit[employer]® Patient Information ©2013 [employer]. ° °

## 2012-10-31 NOTE — Discharge Summary (Signed)
Obstetric Discharge Summary Reason for Admission: onset of labor Prenatal Procedures: ultrasound Intrapartum Procedures: spontaneous vaginal delivery Postpartum Procedures: none Complications-Operative and Postpartum: none Hemoglobin  Date Value Range Status  10/30/2012 9.7* 12.0 - 15.0 g/dL Final     HCT  Date Value Range Status  10/30/2012 29.0* 36.0 - 46.0 % Final    Physical Exam:  General: alert and no distress Lochia: appropriate Uterine Fundus: firm Incision: None DVT Evaluation: No evidence of DVT seen on physical exam.  Discharge Diagnoses: Term Pregnancy-delivered  Discharge Information: Date: 10/31/2012 Activity: pelvic rest Diet: routine Medications: PNV, Ibuprofen, Colace, Iron and Percocet Condition: stable Instructions: refer to practice specific booklet Discharge to: home Follow-up Information   Follow up with MARSHALL,BERNARD A, MD. Schedule an appointment as soon as possible for a visit in 6 weeks.   Contact information:   [address] [place] [address] [phone]       Newborn Data: Live born female  Birth Weight: 7 lb 5 oz (3317 g) APGAR: 9, 9  Home with mother.  HARPER,CHARLES A 10/31/2012, 9:46 AM

## 2012-10-31 NOTE — Progress Notes (Signed)
Post Partum Day 1 Subjective: no complaints  Objective: Blood pressure 125/83, pulse 73, temperature 98 F (36.7 C), temperature source Oral, resp. rate 18, height 5\' 4"  (1.626 m), weight 226 lb 12.8 oz (102.876 kg), SpO2 100.00%, unknown if currently breastfeeding.  Physical Exam:  General: alert and no distress Lochia: appropriate Uterine Fundus: firm Incision: None DVT Evaluation: No evidence of DVT seen on physical exam.   Recent Labs  10/29/12 2230 10/30/12 0630  HGB 10.5* 9.7*  HCT 30.7* 29.0*    Assessment/Plan: Plan for discharge tomorrow   LOS: 2 days   Alicia Barker A 10/31/2012, 9:11 AM

## 2012-12-10 ENCOUNTER — Other Ambulatory Visit: Payer: Self-pay | Admitting: Obstetrics

## 2012-12-21 ENCOUNTER — Encounter (HOSPITAL_COMMUNITY): Payer: Self-pay | Admitting: Pharmacist

## 2012-12-29 ENCOUNTER — Inpatient Hospital Stay (HOSPITAL_COMMUNITY): Admission: RE | Admit: 2012-12-29 | Payer: Medicaid Other | Source: Ambulatory Visit

## 2013-01-01 ENCOUNTER — Encounter (HOSPITAL_COMMUNITY)
Admission: RE | Admit: 2013-01-01 | Discharge: 2013-01-01 | Disposition: A | Discharge status: Home / Self Care | Payer: Medicaid Other | Source: Ambulatory Visit | Attending: Obstetrics | Admitting: Obstetrics

## 2013-01-01 ENCOUNTER — Encounter (HOSPITAL_COMMUNITY): Payer: Self-pay

## 2013-01-01 HISTORY — DX: Major depressive disorder, single episode, unspecified: F32.9

## 2013-01-01 HISTORY — DX: Depression, unspecified: F32.A

## 2013-01-01 LAB — CBC
Hemoglobin: 11.4 g/dL — ABNORMAL LOW (ref 12.0–15.0)
MCH: 30 pg (ref 26.0–34.0)
MCHC: 34.3 g/dL (ref 30.0–36.0)
MCV: 87.4 fL (ref 78.0–100.0)
Platelets: 213 10*3/uL (ref 150–400)

## 2013-01-01 LAB — SURGICAL PCR SCREEN: MRSA, PCR: NEGATIVE

## 2013-01-01 NOTE — Patient Instructions (Addendum)
   Your procedure is scheduled on:  Wednesday, May 28  Enter through the Main Entrance of [facility] at: 830 am Pick up the phone at the desk and dial [phone] and inform us of your arrival.  Please call this number if you have any problems the morning of surgery: [phone]  Remember: Do not eat or drink after midnight: Tuesday Take these medicines the morning of surgery with a SIP OF WATER:   None  Do not wear jewelry, make-up, or FINGER nail polish No metal in your hair or on your body. Do not wear lotions, powders, perfumes. You may wear deodorant.  Please use your CHG wash as directed prior to surgery.  Do not shave anywhere for at least 12 hours prior to first CHG shower.  Do not bring valuables to the hospital. Contacts, dentures or bridgework may not be worn into surgery.  Leave suitcase in the car. After Surgery it may be brought to your room. For patients being admitted to the hospital, checkout time is 11:00am the day of discharge.  Patients discharged on the day of surgery will not be allowed to drive home.  Home with fiance Royal Carolin Coy.

## 2013-01-05 NOTE — H&P (Signed)
NAMEDAISIE, HAFT              ACCOUNT NO.:  [id number]  MEDICAL RECORD NO.:  [id number]  LOCATION:  [facility]                         FACILITY:  [facility]  PHYSICIAN:  Kathreen Cosier, M.D.DATE OF BIRTH:  Jan 11, 1986  DATE OF ADMISSION:  12/10/2012 DATE OF DISCHARGE:                             HISTORY & PHYSICAL   HISTORY OF PRESENT ILLNESS:  The patient is a 27 year old, gravida 5, para November 11, 2003 who desires sterilization on the standard procedure can fail resulting in pregnancy in the tube or uterus.  ALLERGIES:  She is allergic to penicillin.  PAST SURGICAL HISTORY:  Negative.  SOCIAL HISTORY:  Negative.  REVIEW OF SYSTEMS:  Noncontributory.  PHYSICAL EXAMINATION:  GENERAL:  Well-developed female in no distress. HEENT:  Negative. LUNGS:  Clear to P and A. BREASTS:  Negative. HEART:  Regular rhythm.  No murmurs, no gallops. ABDOMEN:  Negative. UTERUS:  Normal size, negative adnexa.  External genitalia normal. Pelvic negative. EXTREMITIES:  Negative.          ______________________________ Kathreen Cosier, M.D.     BAM/MEDQ  D:  01/05/2013  T:  01/05/2013  Job:  [id number]

## 2013-01-06 ENCOUNTER — Ambulatory Visit (HOSPITAL_COMMUNITY)
Admission: RE | Admit: 2013-01-06 | Discharge: 2013-01-06 | Disposition: A | Discharge status: Home / Self Care | Payer: Medicaid Other | Source: Ambulatory Visit | Attending: Obstetrics | Admitting: Obstetrics

## 2013-01-06 ENCOUNTER — Ambulatory Visit (HOSPITAL_COMMUNITY): Payer: Medicaid Other | Admitting: Anesthesiology

## 2013-01-06 ENCOUNTER — Encounter (HOSPITAL_COMMUNITY)
Admission: RE | Disposition: A | Discharge status: Home / Self Care | Payer: Self-pay | Source: Ambulatory Visit | Attending: Obstetrics

## 2013-01-06 ENCOUNTER — Encounter (HOSPITAL_COMMUNITY): Payer: Self-pay | Admitting: Anesthesiology

## 2013-01-06 DIAGNOSIS — Z880 Allergy status to penicillin: Secondary | ICD-10-CM | POA: Insufficient documentation

## 2013-01-06 DIAGNOSIS — Z302 Encounter for sterilization: Secondary | ICD-10-CM | POA: Insufficient documentation

## 2013-01-06 HISTORY — PX: LAPAROSCOPIC TUBAL LIGATION: SHX1937

## 2013-01-06 LAB — PREGNANCY, URINE: Preg Test, Ur: NEGATIVE

## 2013-01-06 SURGERY — LIGATION, FALLOPIAN TUBE, LAPAROSCOPIC
Anesthesia: General | Site: Abdomen | Laterality: Bilateral | Wound class: Clean Contaminated

## 2013-01-06 MED ORDER — DEXAMETHASONE SODIUM PHOSPHATE 10 MG/ML IJ SOLN
INTRAMUSCULAR | Status: AC
  Filled 2013-01-06: qty 1

## 2013-01-06 MED ORDER — NEOSTIGMINE METHYLSULFATE 1 MG/ML IJ SOLN
INTRAMUSCULAR | Status: AC
  Filled 2013-01-06: qty 1

## 2013-01-06 MED ORDER — PROPOFOL 10 MG/ML IV BOLUS
INTRAVENOUS | Status: DC | PRN
  Administered 2013-01-06: 200 mg via INTRAVENOUS

## 2013-01-06 MED ORDER — KETOROLAC TROMETHAMINE 30 MG/ML IJ SOLN
INTRAMUSCULAR | Status: DC | PRN
  Administered 2013-01-06: 30 mg via INTRAMUSCULAR

## 2013-01-06 MED ORDER — FENTANYL CITRATE 0.05 MG/ML IJ SOLN
25.0000 ug | INTRAMUSCULAR | Status: DC | PRN
  Administered 2013-01-06: 50 ug via INTRAVENOUS

## 2013-01-06 MED ORDER — DEXAMETHASONE SODIUM PHOSPHATE 4 MG/ML IJ SOLN
INTRAMUSCULAR | Status: DC | PRN
  Administered 2013-01-06: 8 mg via INTRAVENOUS

## 2013-01-06 MED ORDER — MIDAZOLAM HCL 5 MG/5ML IJ SOLN
INTRAMUSCULAR | Status: DC | PRN
  Administered 2013-01-06: 2 mg via INTRAVENOUS

## 2013-01-06 MED ORDER — KETOROLAC TROMETHAMINE 30 MG/ML IJ SOLN
INTRAMUSCULAR | Status: AC
  Filled 2013-01-06: qty 1

## 2013-01-06 MED ORDER — MIDAZOLAM HCL 2 MG/2ML IJ SOLN
INTRAMUSCULAR | Status: AC
  Filled 2013-01-06: qty 2

## 2013-01-06 MED ORDER — LIDOCAINE HCL (CARDIAC) 20 MG/ML IV SOLN
INTRAVENOUS | Status: AC
  Filled 2013-01-06: qty 5

## 2013-01-06 MED ORDER — FENTANYL CITRATE 0.05 MG/ML IJ SOLN
INTRAMUSCULAR | Status: AC
  Filled 2013-01-06: qty 2

## 2013-01-06 MED ORDER — GLYCOPYRROLATE 0.2 MG/ML IJ SOLN
INTRAMUSCULAR | Status: DC | PRN
  Administered 2013-01-06: 0.4 mg via INTRAVENOUS
  Administered 2013-01-06: 0.2 mg via INTRAVENOUS

## 2013-01-06 MED ORDER — PROPOFOL 10 MG/ML IV EMUL
INTRAVENOUS | Status: AC
  Filled 2013-01-06: qty 20

## 2013-01-06 MED ORDER — GLYCOPYRROLATE 0.2 MG/ML IJ SOLN
INTRAMUSCULAR | Status: AC
  Filled 2013-01-06: qty 3

## 2013-01-06 MED ORDER — LACTATED RINGERS IV SOLN
INTRAVENOUS | Status: DC
Start: 2013-01-06 — End: 2013-01-06
  Administered 2013-01-06 (×2): via INTRAVENOUS

## 2013-01-06 MED ORDER — NEOSTIGMINE METHYLSULFATE 1 MG/ML IJ SOLN
INTRAMUSCULAR | Status: DC | PRN
Start: 2013-01-06 — End: 2013-01-06
  Administered 2013-01-06: 2 mg via INTRAVENOUS

## 2013-01-06 MED ORDER — ROCURONIUM BROMIDE 50 MG/5ML IV SOLN
INTRAVENOUS | Status: AC
  Filled 2013-01-06: qty 1

## 2013-01-06 MED ORDER — ROCURONIUM BROMIDE 100 MG/10ML IV SOLN
INTRAVENOUS | Status: DC | PRN
  Administered 2013-01-06: 30 mg via INTRAVENOUS

## 2013-01-06 MED ORDER — ONDANSETRON HCL 4 MG/2ML IJ SOLN
INTRAMUSCULAR | Status: DC | PRN
  Administered 2013-01-06: 4 mg via INTRAVENOUS

## 2013-01-06 MED ORDER — ONDANSETRON HCL 4 MG/2ML IJ SOLN
INTRAMUSCULAR | Status: AC
  Filled 2013-01-06: qty 2

## 2013-01-06 MED ORDER — LIDOCAINE HCL (CARDIAC) 20 MG/ML IV SOLN
INTRAVENOUS | Status: DC | PRN
  Administered 2013-01-06: 60 mg via INTRAVENOUS

## 2013-01-06 MED ORDER — FENTANYL CITRATE 0.05 MG/ML IJ SOLN
INTRAMUSCULAR | Status: AC
  Filled 2013-01-06: qty 5

## 2013-01-06 MED ORDER — FENTANYL CITRATE 0.05 MG/ML IJ SOLN
INTRAMUSCULAR | Status: DC | PRN
  Administered 2013-01-06: 100 ug via INTRAVENOUS
  Administered 2013-01-06 (×3): 50 ug via INTRAVENOUS

## 2013-01-06 SURGICAL SUPPLY — 14 items
ADH SKN CLS APL DERMABOND .7 (GAUZE/BANDAGES/DRESSINGS) ×1
CATH ROBINSON RED A/P 16FR (CATHETERS) ×2 IMPLANT
CLOTH BEACON ORANGE TIMEOUT ST (SAFETY) ×2 IMPLANT
DERMABOND ADVANCED (GAUZE/BANDAGES/DRESSINGS) ×1
DERMABOND ADVANCED .7 DNX12 (GAUZE/BANDAGES/DRESSINGS) ×1 IMPLANT
GLOVE BIO SURGEON STRL SZ8.5 (GLOVE) ×4 IMPLANT
GOWN PREVENTION PLUS XXLARGE (GOWN DISPOSABLE) ×2 IMPLANT
GOWN STRL REIN XL XLG (GOWN DISPOSABLE) ×2 IMPLANT
PACK LAPAROSCOPY BASIN (CUSTOM PROCEDURE TRAY) ×2 IMPLANT
SUT MON AB 4-0 PS1 27 (SUTURE) ×2 IMPLANT
SUT VIC AB 0 CT1 27 (SUTURE) ×2
SUT VIC AB 0 CT1 27XBRD ANBCTR (SUTURE) ×1 IMPLANT
TOWEL OR 17X24 6PK STRL BLUE (TOWEL DISPOSABLE) ×4 IMPLANT
WATER STERILE IRR 1000ML POUR (IV SOLUTION) ×2 IMPLANT

## 2013-01-06 NOTE — Anesthesia Postprocedure Evaluation (Signed)
  Anesthesia Post-op Note  Patient: Alicia Barker  Procedure(s) Performed: Procedure(s): LAPAROSCOPIC TUBAL LIGATION (Bilateral)  Patient is awake and responsive. Pain and nausea are reasonably well controlled. Vital signs are stable and clinically acceptable. Oxygen saturation is clinically acceptable. There are no apparent anesthetic complications at this time. Patient is ready for discharge.

## 2013-01-06 NOTE — H&P (Signed)
  There has been no change in the history and physical since the original dictation 

## 2013-01-06 NOTE — Transfer of Care (Signed)
Immediate Anesthesia Transfer of Care Note  Patient: Alicia Barker  Procedure(s) Performed: Procedure(s): LAPAROSCOPIC TUBAL LIGATION (Bilateral)  Patient Location: PACU  Anesthesia Type:General  Level of Consciousness: awake, alert , oriented and patient cooperative  Airway & Oxygen Therapy: Patient Spontanous Breathing and Patient connected to nasal cannula oxygen  Post-op Assessment: Report given to PACU RN and Post -op Vital signs reviewed and stable  Post vital signs: Reviewed and stable  Complications: No apparent anesthesia complications

## 2013-01-06 NOTE — Op Note (Signed)
preop diagnosis multiparity postop Postop diagnosis the same Anesthesia general Surgeon Dr. Francoise Ceo   procedure patient placed in the operating table in the lithotomy position abdomen perineum and vagina prepped and draped bladder  Bladder emptied with   straight catheter speculum placed in the vagina and the cervix grasped with a Hulka tenaculum the speculum was removed and  In the  umbilicus a transverse incision made carried down to the fascia the fascia cleaned grasped  With two cochas  and the fascia and peritoneum opened with the Mayo scissors the  Sleeve  the sleeve of the trocar was inserted intraperitoneally  visualising  scope inserted to the carbon dioxide infused uterus tubes and ovaries normal cautery probe inserted through the sleeve of the scope the right tube grasped  1 inch from the  Cornu  and cauterize this was done in a total of 4 places  Moving  laterally from the first site   Of  cautery procedure was done in a similar fashion on  The other  side the  Probes  removed CO2 allowed to escape from the peritoneal cavity fascia closed with one stitch of 0 Vicryl and the skin  closed  a subcuticular stitch of 4-0 Monocryl patient tolerated the procedure well

## 2013-01-06 NOTE — Preoperative (Addendum)
Beta Blockers   Reason not to administer Beta Blockers:Not Applicable 

## 2013-01-06 NOTE — Anesthesia Preprocedure Evaluation (Signed)
Anesthesia Evaluation  Patient identified by MRN, date of birth, ID band Patient awake    Reviewed: Allergy & Precautions, H&P , Patient's Chart, lab work & pertinent test results, reviewed documented beta blocker date and time   Airway Mallampati: II TM Distance: >3 FB Neck ROM: full    Dental no notable dental hx.    Pulmonary  breath sounds clear to auscultation  Pulmonary exam normal       Cardiovascular Rhythm:regular Rate:Normal     Neuro/Psych    GI/Hepatic   Endo/Other    Renal/GU      Musculoskeletal   Abdominal   Peds  Hematology   Anesthesia Other Findings   Reproductive/Obstetrics                           Anesthesia Physical Anesthesia Plan  ASA: II  Anesthesia Plan: General   Post-op Pain Management:    Induction: Intravenous  Airway Management Planned: Oral ETT  Additional Equipment:   Intra-op Plan:   Post-operative Plan:   Informed Consent: I have reviewed the patients History and Physical, chart, labs and discussed the procedure including the risks, benefits and alternatives for the proposed anesthesia with the patient or authorized representative who has indicated his/her understanding and acceptance.   Dental Advisory Given and Dental advisory given  Plan Discussed with: CRNA and Surgeon  Anesthesia Plan Comments: (  Discussed  general anesthesia, including possible nausea, instrumentation of airway, sore throat,pulmonary aspiration, etc. I asked if the were any outstanding questions, or  concerns before we proceeded. )        Anesthesia Quick Evaluation  

## 2013-01-07 ENCOUNTER — Encounter (HOSPITAL_COMMUNITY): Payer: Self-pay | Admitting: Obstetrics

## 2014-06-13 ENCOUNTER — Encounter (HOSPITAL_COMMUNITY): Payer: Self-pay | Admitting: Obstetrics

## 2014-12-01 ENCOUNTER — Emergency Department (HOSPITAL_COMMUNITY)
Admission: EM | Admit: 2014-12-01 | Discharge: 2014-12-01 | Disposition: A | Discharge status: Home / Self Care | Payer: Medicaid Other | Source: Home / Self Care | Attending: Family Medicine | Admitting: Family Medicine

## 2014-12-01 ENCOUNTER — Encounter (HOSPITAL_COMMUNITY): Payer: Self-pay | Admitting: Emergency Medicine

## 2014-12-01 DIAGNOSIS — M25552 Pain in left hip: Secondary | ICD-10-CM | POA: Diagnosis not present

## 2014-12-01 DIAGNOSIS — J302 Other seasonal allergic rhinitis: Secondary | ICD-10-CM | POA: Diagnosis not present

## 2014-12-01 MED ORDER — IPRATROPIUM BROMIDE 0.06 % NA SOLN: 2.0000 | Freq: Four times a day (QID) | NASAL | Status: DC

## 2014-12-01 MED ORDER — DICLOFENAC SODIUM 75 MG PO TBEC: 75.0000 mg | DELAYED_RELEASE_TABLET | Freq: Two times a day (BID) | ORAL | Status: DC

## 2014-12-01 MED ORDER — PREDNISONE 50 MG PO TABS: ORAL_TABLET | ORAL | Status: DC

## 2014-12-01 MED ORDER — FLUTICASONE PROPIONATE 50 MCG/ACT NA SUSP: 1.0000 | Freq: Every day | NASAL | Status: DC

## 2014-12-01 MED ORDER — METHOCARBAMOL 500 MG PO TABS: 500.0000 mg | ORAL_TABLET | Freq: Four times a day (QID) | ORAL | Status: DC | PRN

## 2014-12-01 NOTE — ED Notes (Signed)
Patient c/o sore throat with facial swelling and rash onset 2 days ago. She also reports she has had body aches all over. Patient reports sometimes she has hip pain. Patient is in NAD.

## 2014-12-01 NOTE — Discharge Instructions (Signed)
Because of your hip pain is not immediately clear but is likely due to inflammation of the muscles and tendons and possibly a bursitis. Please start the steroid medicine and then use the Voltaren after finishing that. Please stretch out the area and massage daily. Please start the nasal Atrovent and Flonase for you allergies and please also consider taking a daily allergy pill such as Zyrtec or Allegra.

## 2014-12-01 NOTE — ED Provider Notes (Signed)
CSN: [id number]     Arrival date & time 12/01/14  1908 History   First MD Initiated Contact with Patient 12/01/14 1956     Chief Complaint  Patient presents with  . Allergies  . Generalized Body Aches   (Consider location/radiation/quality/duration/timing/severity/associated sxs/prior Treatment) HPI  Allergies: benadryl w/ benefit. Typically gets allergies this time of year. Associated w/ runny nose. Denies fevers, chest pain, shortness of breath, palpitations.  L hip pain: present since falling on concrete years ago. Comes and goes. Current episode started 2 days ago. Achy and sharp. Unable to sleep at night. Asked pt what makes it worse she said " I don't know." improves w/ warm soaks int he tub. Tylenol 1000mg  and 3 ASA w/o benefit. Denies falls, loss of strength or sensation in the hip, other joint involvement or effusions, fevers, rash, headache, nausea, vomiting.   Past Medical History  Diagnosis Date  . SVD (spontaneous vaginal delivery)     x 5  . Depression     no meds currently   Past Surgical History  Procedure Laterality Date  . Right hand surgery    . Laparoscopic tubal ligation Bilateral 01/06/2013    Procedure: LAPAROSCOPIC TUBAL LIGATION;  Surgeon: Kathreen Cosier, MD;  Location: [facility];  Service: Gynecology;  Laterality: Bilateral;   Family History  Problem Relation Age of Onset  . Diabetes Mother   . Heart disease Mother   . Diabetes Father    History  Substance Use Topics  . Smoking status: Current Some Day Smoker -- 0.10 packs/day    Types: Cigarettes  . Smokeless tobacco: Never Used  . Alcohol Use: No   OB History    Gravida Para Term Preterm AB TAB SAB Ectopic Multiple Living   5 5 4 1  0 0 0 0 0 5     Review of Systems Per HPI with all other pertinent systems negative.   Allergies  Penicillins  Home Medications   Prior to Admission medications   Medication Sig Start Date End Date Taking? Authorizing Provider  diclofenac (VOLTAREN) 75 MG  EC tablet Take 1 tablet (75 mg total) by mouth 2 (two) times daily. 12/01/14   Ozella Rocks, MD  fluticasone (FLONASE) 50 MCG/ACT nasal spray Place 1-2 sprays into both nostrils at bedtime. 12/01/14   Ozella Rocks, MD  ipratropium (ATROVENT) 0.06 % nasal spray Place 2 sprays into both nostrils 4 (four) times daily. 12/01/14   Ozella Rocks, MD  methocarbamol (ROBAXIN) 500 MG tablet Take 1-2 tablets (500-1,000 mg total) by mouth every 6 (six) hours as needed for muscle spasms. 12/01/14   Ozella Rocks, MD  predniSONE (DELTASONE) 50 MG tablet Take daily with breakfast 12/01/14   Ozella Rocks, MD   BP 116/78 mmHg  Pulse 81  Temp(Src) 98.5 F (36.9 C) (Oral)  Resp 12  SpO2 98%  LMP 11/29/2014 Physical Exam Physical Exam  Constitutional: oriented to person, place, and time. appears well-developed and well-nourished. No distress.  HENT:  Pharyngeal cobblestoning, nasal discharge Head: Normocephalic and atraumatic.  Eyes: EOMI. PERRL.  Neck: Normal range of motion.  Cardiovascular: RRR, no m/r/g, 2+ distal pulses,  Pulmonary/Chest: Effort normal and breath sounds normal. No respiratory distress.  Abdominal: Soft. Bowel sounds are normal. NonTTP, no distension.  Musculoskeletal: Diffuse left hip tenderness without specific point tenderness. Area of tenderness seems to shift throughout the fatty and muscular tissue in the hip and gluteal region. Full range of motion. No effusion. The bony  and rebound..  Neurological: alert and oriented to person, place, and time.  Skin: Skin is warm. No rash noted. non diaphoretic.  Psychiatric: normal mood and affect. behavior is normal. Judgment and thought content normal.   ED Course  Procedures (including critical care time) Labs Review Labs Reviewed - No data to display  Imaging Review No results found.   MDM   1. Hip pain, left   2. Seasonal allergies    Start Five-day course of steroids for possible bursitis with surrounding  inflammation. Robaxin, Voltaren after finishing steroids. Recommending exercises, massage and heat. Patient follow-up with sports medicine or orthopedic surgery if does not improve with this therapy.  * Atrovent, Flonase, and a daily allergy pill as well as steroid treatment as outlined above.    Ozella Rocks, MD 12/01/14 2039

## 2015-04-17 ENCOUNTER — Emergency Department (HOSPITAL_COMMUNITY): Payer: Medicaid Other

## 2015-04-17 ENCOUNTER — Emergency Department (HOSPITAL_COMMUNITY)
Admission: EM | Admit: 2015-04-17 | Discharge: 2015-04-17 | Disposition: A | Discharge status: Home / Self Care | Payer: Medicaid Other | Attending: Emergency Medicine | Admitting: Emergency Medicine

## 2015-04-17 ENCOUNTER — Encounter (HOSPITAL_COMMUNITY): Payer: Self-pay

## 2015-04-17 DIAGNOSIS — Z720 Tobacco use: Secondary | ICD-10-CM | POA: Insufficient documentation

## 2015-04-17 DIAGNOSIS — Z79899 Other long term (current) drug therapy: Secondary | ICD-10-CM | POA: Insufficient documentation

## 2015-04-17 DIAGNOSIS — S7012XA Contusion of left thigh, initial encounter: Secondary | ICD-10-CM | POA: Insufficient documentation

## 2015-04-17 DIAGNOSIS — W01198A Fall on same level from slipping, tripping and stumbling with subsequent striking against other object, initial encounter: Secondary | ICD-10-CM | POA: Insufficient documentation

## 2015-04-17 DIAGNOSIS — Y9389 Activity, other specified: Secondary | ICD-10-CM | POA: Diagnosis not present

## 2015-04-17 DIAGNOSIS — F329 Major depressive disorder, single episode, unspecified: Secondary | ICD-10-CM | POA: Insufficient documentation

## 2015-04-17 DIAGNOSIS — Z791 Long term (current) use of non-steroidal anti-inflammatories (NSAID): Secondary | ICD-10-CM | POA: Insufficient documentation

## 2015-04-17 DIAGNOSIS — Y998 Other external cause status: Secondary | ICD-10-CM | POA: Insufficient documentation

## 2015-04-17 DIAGNOSIS — Y9289 Other specified places as the place of occurrence of the external cause: Secondary | ICD-10-CM | POA: Diagnosis not present

## 2015-04-17 DIAGNOSIS — S7002XA Contusion of left hip, initial encounter: Secondary | ICD-10-CM | POA: Diagnosis not present

## 2015-04-17 DIAGNOSIS — Z3202 Encounter for pregnancy test, result negative: Secondary | ICD-10-CM | POA: Diagnosis not present

## 2015-04-17 DIAGNOSIS — Z880 Allergy status to penicillin: Secondary | ICD-10-CM | POA: Insufficient documentation

## 2015-04-17 DIAGNOSIS — S79912A Unspecified injury of left hip, initial encounter: Secondary | ICD-10-CM | POA: Diagnosis present

## 2015-04-17 LAB — I-STAT BETA HCG BLOOD, ED (MC, WL, AP ONLY): I-stat hCG, quantitative: <5 m[IU]/mL (ref <5)

## 2015-04-17 MED ORDER — MORPHINE SULFATE (PF) 4 MG/ML IV SOLN
4.0000 mg | Freq: Once | INTRAVENOUS | Status: AC
Start: 2015-04-17 — End: 2015-04-17
  Administered 2015-04-17: 4 mg via INTRAMUSCULAR
  Filled 2015-04-17: qty 1

## 2015-04-17 MED ORDER — IBUPROFEN 200 MG PO TABS
600.0000 mg | ORAL_TABLET | Freq: Once | ORAL | Status: AC
  Administered 2015-04-17: 600 mg via ORAL
  Filled 2015-04-17: qty 3

## 2015-04-17 MED ORDER — IBUPROFEN 600 MG PO TABS: 600.0000 mg | ORAL_TABLET | Freq: Four times a day (QID) | ORAL | Status: DC | PRN

## 2015-04-17 MED ORDER — TRAMADOL HCL 50 MG PO TABS: 50.0000 mg | ORAL_TABLET | Freq: Four times a day (QID) | ORAL | Status: DC | PRN

## 2015-04-17 NOTE — Discharge Instructions (Signed)
Contusion °A contusion is a deep bruise. Contusions are the result of an injury that caused bleeding under the skin. The contusion may turn blue, purple, or yellow. Minor injuries will give you a painless contusion, but more severe contusions may stay painful and swollen for a few weeks.  °CAUSES  °A contusion is usually caused by a blow, trauma, or direct force to an area of the body. °SYMPTOMS  °· Swelling and redness of the injured area. °· Bruising of the injured area. °· Tenderness and soreness of the injured area. °· Pain. °DIAGNOSIS  °The diagnosis can be made by taking a history and physical exam. An X-ray, CT scan, or MRI may be needed to determine if there were any associated injuries, such as fractures. °TREATMENT  °Specific treatment will depend on what area of the body was injured. In general, the best treatment for a contusion is resting, icing, elevating, and applying cold compresses to the injured area. Over-the-counter medicines may also be recommended for pain control. Ask your caregiver what the best treatment is for your contusion. °HOME CARE INSTRUCTIONS  °· Put ice on the injured area. °¨ Put ice in a plastic bag. °¨ Place a towel between your skin and the bag. °¨ Leave the ice on for 15-20 minutes, 3-4 times a day, or as directed by your health care provider. °· Only take over-the-counter or prescription medicines for pain, discomfort, or fever as directed by your caregiver. Your caregiver may recommend avoiding anti-inflammatory medicines (aspirin, ibuprofen, and naproxen) for 48 hours because these medicines may increase bruising. °· Rest the injured area. °· If possible, elevate the injured area to reduce swelling. °SEEK IMMEDIATE MEDICAL CARE IF:  °· You have increased bruising or swelling. °· You have pain that is getting worse. °· Your swelling or pain is not relieved with medicines. °MAKE SURE YOU:  °· Understand these instructions. °· Will watch your condition. °· Will get help right  away if you are not doing well or get worse. °Document Released: 05/08/2005 Document Revised: 08/03/2013 Document Reviewed: 06/03/2011 °[employer]® Patient Information ©2015 [employer]. This information is not intended to replace advice given to you by your health care provider. Make sure you discuss any questions you have with your health care provider. ° °

## 2015-04-17 NOTE — ED Provider Notes (Signed)
CSN: [id number]     Arrival date & time 04/17/15  0217 History   First MD Initiated Contact with Patient 04/17/15 0413     Chief Complaint  Patient presents with  . Fall  . Hip Pain     (Consider location/radiation/quality/duration/timing/severity/associated sxs/prior Treatment) HPI Patient presents after a ground-level fall at midnight. States she fell onto her left hip. She's been able to walk though states she continues to have pain in the left thigh areas. She does have some pubic pain after the fall. Denies hitting her head. No numbness or weakness. Past Medical History  Diagnosis Date  . SVD (spontaneous vaginal delivery)     x 5  . Depression     no meds currently   Past Surgical History  Procedure Laterality Date  . Right hand surgery    . Laparoscopic tubal ligation Bilateral 01/06/2013    Procedure: LAPAROSCOPIC TUBAL LIGATION;  Surgeon: Kathreen Cosier, MD;  Location: [facility];  Service: Gynecology;  Laterality: Bilateral;   Family History  Problem Relation Age of Onset  . Diabetes Mother   . Heart disease Mother   . Diabetes Father    Social History  Substance Use Topics  . Smoking status: Current Some Day Smoker -- 0.10 packs/day    Types: Cigarettes  . Smokeless tobacco: Never Used  . Alcohol Use: No   OB History    Gravida Para Term Preterm AB TAB SAB Ectopic Multiple Living   0 0 0 0 0 5     Review of Systems  Gastrointestinal: Negative for nausea, vomiting and abdominal pain.  Musculoskeletal: Positive for myalgias and arthralgias.  Skin: Negative for rash and wound.  Neurological: Negative for weakness and numbness.  All other systems reviewed and are negative.     Allergies  Penicillins  Home Medications   Prior to Admission medications   Medication Sig Start Date End Date Taking? Authorizing Provider  diclofenac (VOLTAREN) 75 MG EC tablet Take 1 tablet (75 mg total) by mouth 2 (two) times daily. 12/01/14   Ozella Rocks, MD   fluticasone (FLONASE) 50 MCG/ACT nasal spray Place 1-2 sprays into both nostrils at bedtime. 12/01/14   Ozella Rocks, MD  ibuprofen (ADVIL,MOTRIN) 600 MG tablet Take 1 tablet (600 mg total) by mouth every 6 (six) hours as needed. 04/17/15   Loren Racer, MD  ipratropium (ATROVENT) 0.06 % nasal spray Place 2 sprays into both nostrils 4 (four) times daily. 12/01/14   Ozella Rocks, MD  methocarbamol (ROBAXIN) 500 MG tablet Take 1-2 tablets (500-1,000 mg total) by mouth every 6 (six) hours as needed for muscle spasms. 12/01/14   Ozella Rocks, MD  predniSONE (DELTASONE) 50 MG tablet Take daily with breakfast 12/01/14   Ozella Rocks, MD  traMADol (ULTRAM) 50 MG tablet Take 1 tablet (50 mg total) by mouth every 6 (six) hours as needed. 04/17/15   Loren Racer, MD   BP 106/65 mmHg  Pulse 69  Temp(Src) 98.3 F (36.8 C)  Resp 16  SpO2 100%  LMP 04/17/2015 Physical Exam  Constitutional: She is oriented to person, place, and time. She appears well-developed and well-nourished. No distress.  HENT:  Head: Normocephalic and atraumatic.  Mouth/Throat: Oropharynx is clear and moist.  Eyes: EOM are normal. Pupils are equal, round, and reactive to light.  Neck: Normal range of motion. Neck supple.  Cardiovascular: Normal rate and regular rhythm.   Pulmonary/Chest: Effort normal and breath sounds normal.  No respiratory distress. She has no wheezes. She has no rales.  Abdominal: Soft. Bowel sounds are normal. She exhibits no distension and no mass. There is tenderness (small amount of suprapubic tenderness). There is no rebound and no guarding.  Musculoskeletal: Normal range of motion. She exhibits tenderness. She exhibits no edema.  Patient with tenderness to palpation over the lateral left thigh and iliac crest. Chest pain with range of motion of the left hip. There is no shortening or deformity present. No obvious bruising or swelling. Distal pulses are intact.  Neurological: She is alert and  oriented to person, place, and time.  5/5 motor in all extremities. Sensation is fully intact.  Skin: Skin is warm and dry. No rash noted. No erythema.  Psychiatric: She has a normal mood and affect. Her behavior is normal.  Nursing note and vitals reviewed.   ED Course  Procedures (including critical care time) Labs Review Labs Reviewed  I-STAT BETA HCG BLOOD, ED ([facility], [facility], [facility] ONLY)    Imaging Review Ct Pelvis Wo Contrast  04/17/2015   CLINICAL DATA:  Slip and fall injury, landing on the left hip. Left hip and leg pain.  EXAM: CT PELVIS WITHOUT CONTRAST  TECHNIQUE: Multidetector CT imaging of the pelvis was performed following the standard protocol without intravenous contrast.  COMPARISON:  None.  FINDINGS: The left hip and pelvis appear intact. No acute displaced fractures identified. Healed fracture deformities are demonstrated in the inferior pubic rami bilaterally. Mild diastases of the symphysis pubis is also likely chronic. Degenerative changes demonstrated in both sacroiliac joints with sclerosis on both sides of the joint, vacuum phenomenon within the joint, and subcortical cysts. Mild hypertrophic degenerative changes on the femoral heads. No destructive bone lesions demonstrated.  No significant soft tissue hematoma or infiltration in or around the left hip.  Soft tissue pelvic structures demonstrate normal appendix. Uterus and ovaries are not enlarged. No free or loculated pelvic fluid collections.  IMPRESSION: No acute fracture or dislocation of the left hip. Old fracture deformities of the inferior pubic rami with old appearing pubic diastases. Degenerative changes in the sacroiliac joints.   Electronically Signed   By: Burman Nieves M.D.   On: 04/17/2015 06:18   Dg Hip Unilat With Pelvis 2-3 Views Left  04/17/2015   CLINICAL DATA:  Slipped and fell at store, LEFT hip pain.  EXAM: DG HIP (WITH OR WITHOUT PELVIS) 2-3V LEFT  COMPARISON:  None.  FINDINGS: Femoral heads are well formed  and located. Hip joint spaces are intact. Sacroiliac joints are symmetric. Widened pubic symphysis  No destructive bony lesions. Included soft tissue planes are non-suspicious.  IMPRESSION: Widened pubic symphysis, recommend correlation with point tenderness. No fracture deformity.   Electronically Signed   By: Awilda Metro M.D.   On: 04/17/2015 03:19   I have personally reviewed and evaluated these images and lab results as part of my medical decision-making.   EKG Interpretation None      MDM   Final diagnoses:  Contusion, hip and thigh, left, initial encounter   No acute injury on CT. We'll treat for contusion/strain. Return precautions given.     Loren Racer, MD 04/17/15 [phone]

## 2015-04-17 NOTE — ED Notes (Signed)
Pt states she slipped and fell landing on her left hip and has pain to the hip and her left leg. Hurts in the thigh area.

## 2015-05-23 ENCOUNTER — Encounter (HOSPITAL_COMMUNITY): Payer: Self-pay | Admitting: *Deleted

## 2015-05-23 ENCOUNTER — Emergency Department (INDEPENDENT_AMBULATORY_CARE_PROVIDER_SITE_OTHER)
Admission: EM | Admit: 2015-05-23 | Discharge: 2015-05-23 | Disposition: A | Discharge status: Home / Self Care | Payer: Self-pay | Source: Home / Self Care | Attending: Family Medicine | Admitting: Family Medicine

## 2015-05-23 DIAGNOSIS — M5440 Lumbago with sciatica, unspecified side: Secondary | ICD-10-CM

## 2015-05-23 DIAGNOSIS — M5137 Other intervertebral disc degeneration, lumbosacral region: Secondary | ICD-10-CM

## 2015-05-23 MED ORDER — PREDNISONE 50 MG PO TABS
ORAL_TABLET | ORAL | Status: DC
Start: 2015-05-23 — End: 2015-11-10

## 2015-05-23 MED ORDER — GABAPENTIN 100 MG PO CAPS: 200.0000 mg | ORAL_CAPSULE | Freq: Every day | ORAL | Status: DC

## 2015-05-23 NOTE — Discharge Instructions (Signed)
You might have arthritis in your back and maybe a hip bursa problem You will need a referral to Orthopedics which has been made but you need to call them You will need to take steroid tablets for 5 days which I have re-prescribed for you You should also try the nerve pill which will help you with tyhe pain

## 2015-05-23 NOTE — ED Notes (Signed)
Pt  Reports  Low back  Pain  Ans   Well  As      Pain  l  Hip         Pt  Reports    She  Larey Seat  About  1  Month    Ago     -    And  Continues    To  Have   Symptoms     -  Symptoms      Not  releived  By  Tylenol   /  ibuprophen

## 2015-05-23 NOTE — ED Provider Notes (Signed)
CSN: [id number]     Arrival date & time 05/23/15  1301 History   First MD Initiated Contact with Patient 05/23/15 1314     No chief complaint on file.   HPI   29 y/o ? 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 SVD (spontaneous vaginal delivery)     x 5  . Depression     no meds currently   Past Surgical History  Procedure Laterality Date  . Right hand surgery    . Laparoscopic tubal ligation Bilateral 01/06/2013    Procedure: LAPAROSCOPIC TUBAL LIGATION;  Surgeon: Kathreen Cosier, MD;  Location: [facility];  Service: Gynecology;  Laterality: Bilateral;   Family History  Problem Relation Age of Onset  . Diabetes Mother   . Heart disease Mother   . Diabetes Father    Social History  Substance Use Topics  . Smoking status: Current Some Day Smoker -- 0.10 packs/day    Types: Cigarettes  . Smokeless tobacco: Never Used  . Alcohol Use: No   OB History    Gravida Para Term Preterm AB TAB SAB Ectopic Multiple Living   5 5 4 1  0 0 0 0 0 5     Review of Systems  Allergies  Penicillins  Home Medications   Prior to Admission medications   Medication Sig Start Date End Date Taking? Authorizing Provider  diclofenac (VOLTAREN) 75 MG EC tablet Take 1 tablet (75 mg total) by mouth 2 (two) times daily. 12/01/14   Ozella Rocks, MD  fluticasone (FLONASE) 50 MCG/ACT nasal spray Place 1-2 sprays into both nostrils at bedtime. 12/01/14   Ozella Rocks, MD  ibuprofen  (ADVIL,MOTRIN) 600 MG tablet Take 1 tablet (600 mg total) by mouth every 6 (six) hours as needed. 04/17/15   Loren Racer, MD  ipratropium (ATROVENT) 0.06 % nasal spray Place 2 sprays into both nostrils 4 (four) times daily. 12/01/14   Ozella Rocks, MD  methocarbamol (ROBAXIN) 500 MG tablet Take 1-2 tablets (500-1,000 mg total) by mouth every 6 (six) hours as needed for muscle spasms. 12/01/14   Ozella Rocks, MD  predniSONE (DELTASONE) 50 MG tablet Take daily with breakfast 12/01/14   Ozella Rocks, MD  traMADol (ULTRAM) 50 MG tablet Take 1 tablet (50 mg total) by mouth every 6 (six) hours as needed. 04/17/15   Loren Racer, MD   Meds Ordered and Administered this Visit  Medications - No data to display  There were no vitals taken for this visit. No data found.   Physical Exam   Eomi, ncat obese s1 s 2no m/r/g Chest clear no added sound abd soft nt nd  SLR is equivocal on the L side. Log roll tst neg Fadir Faber testing deffered as majority of her pain is localized over L hip burse  ED Course  Procedures (including critical care time)  Labs Review Labs  Reviewed - No data to display  Imaging Review No results found.   Visual Acuity Review  Right Eye Distance:   Left Eye Distance:   Bilateral Distance:    Right Eye Near:   Left Eye Near:    Bilateral Near:         MDM  No diagnosis found. 29 y/o ? with aub-acute hip pain which is suggestive of bursitis vs S-I dysfucntion or Lumbar facet arthritis  Will Rx Prednisone burst 60 mg daily Add Neurontin qhs   Will refer to Ortho pedics Dr. August Saucer who is on call for consideration of Bursa injection as well as further imaging  She does not have any red-flags and this may be able to be maneged conservatively as an OP at this time   Pleas Koch, MD [facility] ([phone]     Rhetta Mura, MD 05/23/15 1355

## 2015-05-23 NOTE — Patient Instructions (Signed)

## 2015-10-14 ENCOUNTER — Encounter (HOSPITAL_COMMUNITY): Payer: Self-pay | Admitting: Emergency Medicine

## 2015-10-14 ENCOUNTER — Emergency Department (HOSPITAL_COMMUNITY): Payer: Medicaid Other

## 2015-10-14 ENCOUNTER — Emergency Department (HOSPITAL_COMMUNITY)
Admission: EM | Admit: 2015-10-14 | Discharge: 2015-10-15 | Discharge status: Against Medical Advice | Payer: Medicaid Other | Attending: Emergency Medicine | Admitting: Emergency Medicine

## 2015-10-14 DIAGNOSIS — R200 Anesthesia of skin: Secondary | ICD-10-CM | POA: Diagnosis present

## 2015-10-14 DIAGNOSIS — Z3202 Encounter for pregnancy test, result negative: Secondary | ICD-10-CM | POA: Insufficient documentation

## 2015-10-14 DIAGNOSIS — Z880 Allergy status to penicillin: Secondary | ICD-10-CM | POA: Diagnosis not present

## 2015-10-14 DIAGNOSIS — R42 Dizziness and giddiness: Secondary | ICD-10-CM | POA: Diagnosis not present

## 2015-10-14 DIAGNOSIS — Z8659 Personal history of other mental and behavioral disorders: Secondary | ICD-10-CM | POA: Diagnosis not present

## 2015-10-14 DIAGNOSIS — F17210 Nicotine dependence, cigarettes, uncomplicated: Secondary | ICD-10-CM | POA: Diagnosis not present

## 2015-10-14 LAB — BASIC METABOLIC PANEL
Anion gap: 7 (ref 5–15)
BUN: 6 mg/dL (ref 6–20)
CALCIUM: 8.7 mg/dL — AB (ref 8.9–10.3)
CHLORIDE: 109 mmol/L (ref 101–111)
CO2: 25 mmol/L (ref 22–32)
CREATININE: 0.63 mg/dL (ref 0.44–1.00)
Glucose, Bld: 104 mg/dL — ABNORMAL HIGH (ref 65–99)
Potassium: 3.7 mmol/L (ref 3.5–5.1)
SODIUM: 141 mmol/L (ref 135–145)

## 2015-10-14 LAB — HEPATIC FUNCTION PANEL
ALT: 20 U/L (ref 14–54)
AST: 20 U/L (ref 15–41)
Albumin: 3.3 g/dL — ABNORMAL LOW (ref 3.5–5.0)
Alkaline Phosphatase: 49 U/L (ref 38–126)
Bilirubin, Direct: <0.1 mg/dL — ABNORMAL LOW (ref 0.1–0.5)
Total Bilirubin: 0.4 mg/dL (ref 0.3–1.2)
Total Protein: 6 g/dL — ABNORMAL LOW (ref 6.5–8.1)

## 2015-10-14 LAB — URINE MICROSCOPIC-ADD ON
RBC / HPF: NONE SEEN RBC/hpf (ref 0–5)
SQUAMOUS EPITHELIAL / LPF: NONE SEEN

## 2015-10-14 LAB — CBC
HCT: 32.6 % — ABNORMAL LOW (ref 36.0–46.0)
HEMOGLOBIN: 10.8 g/dL — AB (ref 12.0–15.0)
MCH: 28.6 pg (ref 26.0–34.0)
MCHC: 33.1 g/dL (ref 30.0–36.0)
MCV: 86.5 fL (ref 78.0–100.0)
PLATELETS: 206 10*3/uL (ref 150–400)
RBC: 3.77 MIL/uL — ABNORMAL LOW (ref 3.87–5.11)
RDW: 13.8 % (ref 11.5–15.5)
WBC: 4.4 10*3/uL (ref 4.0–10.5)

## 2015-10-14 LAB — I-STAT BETA HCG BLOOD, ED (MC, WL, AP ONLY)

## 2015-10-14 LAB — URINALYSIS, ROUTINE W REFLEX MICROSCOPIC
Bilirubin Urine: NEGATIVE
GLUCOSE, UA: NEGATIVE mg/dL
Hgb urine dipstick: NEGATIVE
KETONES UR: NEGATIVE mg/dL
LEUKOCYTES UA: NEGATIVE
Nitrite: POSITIVE — AB
PH: 6.5 (ref 5.0–8.0)
Protein, ur: NEGATIVE mg/dL
Specific Gravity, Urine: 1.025 (ref 1.005–1.030)

## 2015-10-14 MED ORDER — SODIUM CHLORIDE 0.9 % IV SOLN
INTRAVENOUS | Status: DC
  Administered 2015-10-14: 20:00:00 via INTRAVENOUS

## 2015-10-14 MED ORDER — SODIUM CHLORIDE 0.9 % IV BOLUS (SEPSIS)
500.0000 mL | Freq: Once | INTRAVENOUS | Status: AC
  Administered 2015-10-14: 500 mL via INTRAVENOUS

## 2015-10-14 NOTE — ED Notes (Signed)
Pt ambulated unassisted from room to restroom with steady gait.

## 2015-10-14 NOTE — ED Notes (Signed)
Patient transported to X-ray 

## 2015-10-14 NOTE — ED Notes (Signed)
MD at bedside. 

## 2015-10-14 NOTE — ED Provider Notes (Addendum)
CSN: [id number]     Arrival date & time 10/14/15  1359 History   First MD Initiated Contact with Patient 10/14/15 1635     Chief Complaint  Patient presents with  . Numbness  . Dizziness     (Consider location/radiation/quality/duration/timing/severity/associated sxs/prior Treatment) The history is provided by the patient.   30 year old female with onset of bilateral numbness to the feet which by the end of yesterday had progressed up to her knees. When she woke this morning. Was up to her umbilical area. Associated with dizziness. No motor problems. Patient stated that it felt as if she had an epidural. Has not had one recently. No fevers. No upper respiratory symptoms. No nausea no vomiting. No back pain no neck pain. No headache. States there was some mild dizziness. No difficulty urinating or having bowel movements and no incontinence. No history of similar problem. Patient's was able to walk normal with a steady gait. No pain associated with it.  Past Medical History  Diagnosis Date  . SVD (spontaneous vaginal delivery)     x 5  . Depression     no meds currently   Past Surgical History  Procedure Laterality Date  . Right hand surgery    . Laparoscopic tubal ligation Bilateral 01/06/2013    Procedure: LAPAROSCOPIC TUBAL LIGATION;  Surgeon: Kathreen Cosier, MD;  Location: [facility];  Service: Gynecology;  Laterality: Bilateral;   Family History  Problem Relation Age of Onset  . Diabetes Mother   . Heart disease Mother   . Diabetes Father    Social History  Substance Use Topics  . Smoking status: Current Some Day Smoker -- 0.10 packs/day    Types: Cigarettes  . Smokeless tobacco: Never Used  . Alcohol Use: No   OB History    Gravida Para Term Preterm AB TAB SAB Ectopic Multiple Living   5 5 4 1  0 0 0 0 0 5     Review of Systems  Constitutional: Negative for fever.  HENT: Negative for congestion, hearing loss and sore throat.   Eyes: Negative for redness and visual  disturbance.  Respiratory: Negative for cough and shortness of breath.   Cardiovascular: Negative for chest pain and palpitations.  Gastrointestinal: Negative for nausea, vomiting and abdominal pain.  Genitourinary: Negative for dysuria.  Musculoskeletal: Negative for back pain and neck pain.  Skin: Negative for rash.  Neurological: Positive for dizziness and numbness. Negative for speech difficulty, weakness and headaches.  Hematological: Does not bruise/bleed easily.  Psychiatric/Behavioral: Negative for confusion.      Allergies  Penicillins  Home Medications   Prior to Admission medications   Medication Sig Start Date End Date Taking? Authorizing Provider  acetaminophen (TYLENOL) 500 MG tablet Take 500 mg by mouth every 6 (six) hours as needed for mild pain.    Yes Historical Provider, MD  diclofenac (VOLTAREN) 75 MG EC tablet Take 1 tablet (75 mg total) by mouth 2 (two) times daily. Patient not taking: Reported on 10/14/2015 12/01/14   Ozella Rocks, MD  fluticasone [facility]) 50 MCG/ACT nasal spray Place 1-2 sprays into both nostrils at bedtime. Patient not taking: Reported on 10/14/2015 12/01/14   Ozella Rocks, MD  gabapentin (NEURONTIN) 100 MG capsule Take 2 capsules (200 mg total) by mouth at bedtime. Patient not taking: Reported on 10/14/2015 05/23/15   Rhetta Mura, MD  ibuprofen (ADVIL,MOTRIN) 600 MG tablet Take 1 tablet (600 mg total) by mouth every 6 (six) hours as needed. Patient not taking:  Reported on 10/14/2015 04/17/15   Loren Racer, MD  ipratropium (ATROVENT) 0.06 % nasal spray Place 2 sprays into both nostrils 4 (four) times daily. Patient not taking: Reported on 10/14/2015 12/01/14   Ozella Rocks, MD  methocarbamol (ROBAXIN) 500 MG tablet Take 1-2 tablets (500-1,000 mg total) by mouth every 6 (six) hours as needed for muscle spasms. Patient not taking: Reported on 10/14/2015 12/01/14   Ozella Rocks, MD  predniSONE (DELTASONE) 50 MG tablet Take daily with  breakfast Patient not taking: Reported on 10/14/2015 05/23/15   Rhetta Mura, MD  traMADol (ULTRAM) 50 MG tablet Take 1 tablet (50 mg total) by mouth every 6 (six) hours as needed. Patient not taking: Reported on 10/14/2015 04/17/15   Loren Racer, MD   BP 109/78 mmHg  Pulse 55  Temp(Src) 97.4 F (36.3 C) (Oral)  Resp 16  SpO2 100%  LMP 10/03/2015 Physical Exam  Constitutional: She is oriented to person, place, and time. She appears well-developed and well-nourished. No distress.  HENT:  Head: Normocephalic and atraumatic.  Mouth/Throat: Oropharynx is clear and moist.  Eyes: Conjunctivae and EOM are normal. Pupils are equal, round, and reactive to light.  Neck: Normal range of motion. Neck supple.  Cardiovascular: Normal rate, regular rhythm and normal heart sounds.   No murmur heard. Pulmonary/Chest: Effort normal and breath sounds normal. No respiratory distress.  Abdominal: Soft. Bowel sounds are normal. There is no tenderness.  Musculoskeletal: Normal range of motion.  Neurological: She is alert and oriented to person, place, and time. No cranial nerve deficit. She exhibits normal muscle tone. Coordination normal.  Except for glovelike number numbness both legs all the way up to the umbilicus. Motor completely intact. Patellar reflexes and Achilles reflexes all normal.  Nursing note and vitals reviewed.   ED Course  Procedures (including critical care time) Labs Review Labs Reviewed  BASIC METABOLIC PANEL - Abnormal; Notable for the following:    Glucose, Bld 104 (*)    Calcium 8.7 (*)    All other components within normal limits  CBC - Abnormal; Notable for the following:    RBC 3.77 (*)    Hemoglobin 10.8 (*)    HCT 32.6 (*)    All other components within normal limits  URINALYSIS, ROUTINE W REFLEX MICROSCOPIC (NOT AT [facility]) - Abnormal; Notable for the following:    Nitrite POSITIVE (*)    All other components within normal limits  HEPATIC FUNCTION PANEL -  Abnormal; Notable for the following:    Total Protein 6.0 (*)    Albumin 3.3 (*)    Bilirubin, Direct <0.1 (*)    All other components within normal limits  URINE MICROSCOPIC-ADD ON - Abnormal; Notable for the following:    Bacteria, UA MANY (*)    All other components within normal limits  I-STAT BETA HCG BLOOD, ED ([facility], [facility], [facility] ONLY)   Results for orders placed or performed during the hospital encounter of 10/14/15  Basic metabolic panel  Result Value Ref Range   Sodium 141 135 - 145 mmol/L   Potassium 3.7 3.5 - 5.1 mmol/L   Chloride 109 101 - 111 mmol/L   CO2 25 22 - 32 mmol/L   Glucose, Bld 104 (H) 65 - 99 mg/dL   BUN 6 6 - 20 mg/dL   Creatinine, Ser 1.61 0.44 - 1.00 mg/dL   Calcium 8.7 (L) 8.9 - 10.3 mg/dL   GFR calc non Af Amer >60 >60 mL/min   GFR calc Af Amer >  60 >60 mL/min   Anion gap 7 5 - 15  CBC  Result Value Ref Range   WBC 4.4 4.0 - 10.5 K/uL   RBC 3.77 (L) 3.87 - 5.11 MIL/uL   Hemoglobin 10.8 (L) 12.0 - 15.0 g/dL   HCT 73.4 (L) 28.7 - 68.1 %   MCV 86.5 78.0 - 100.0 fL   MCH 28.6 26.0 - 34.0 pg   MCHC 33.1 30.0 - 36.0 g/dL   RDW 15.7 26.2 - 03.5 %   Platelets 206 150 - 400 K/uL  Urinalysis, Routine w reflex microscopic (not at [facility])  Result Value Ref Range   Color, Urine YELLOW YELLOW   APPearance CLEAR CLEAR   Specific Gravity, Urine 1.025 1.005 - 1.030   pH 6.5 5.0 - 8.0   Glucose, UA NEGATIVE NEGATIVE mg/dL   Hgb urine dipstick NEGATIVE NEGATIVE   Bilirubin Urine NEGATIVE NEGATIVE   Ketones, ur NEGATIVE NEGATIVE mg/dL   Protein, ur NEGATIVE NEGATIVE mg/dL   Nitrite POSITIVE (A) NEGATIVE   Leukocytes, UA NEGATIVE NEGATIVE  Hepatic function panel  Result Value Ref Range   Total Protein 6.0 (L) 6.5 - 8.1 g/dL   Albumin 3.3 (L) 3.5 - 5.0 g/dL   AST 20 15 - 41 U/L   ALT 20 14 - 54 U/L   Alkaline Phosphatase 49 38 - 126 U/L   Total Bilirubin 0.4 0.3 - 1.2 mg/dL   Bilirubin, Direct <5.9 (L) 0.1 - 0.5 mg/dL   Indirect Bilirubin NOT CALCULATED 0.3 - 0.9  mg/dL  Urine microscopic-add on  Result Value Ref Range   Squamous Epithelial / LPF NONE SEEN NONE SEEN   WBC, UA 0-5 0 - 5 WBC/hpf   RBC / HPF NONE SEEN 0 - 5 RBC/hpf   Bacteria, UA MANY (A) NONE SEEN  I-Stat beta hCG blood, ED ([facility], [facility], [facility] only)  Result Value Ref Range   I-stat hCG, quantitative <5.0 <5 mIU/mL   Comment 3             Imaging Review Dg Chest 2 View  10/14/2015  CLINICAL DATA:  Pt is experiencing numbness down both legs to the feet; She has not had trauma; She denies having any heart or respiratory problems; Onset was yesterday; smoker 10 years EXAM: CHEST  2 VIEW COMPARISON:  None. FINDINGS: Midline trachea.  Normal heart size and mediastinal contours. Sharp costophrenic angles.  No pneumothorax.  Clear lungs. IMPRESSION: No active cardiopulmonary disease. Electronically Signed   By: Jeronimo Greaves M.D.   On: 10/14/2015 18:30   Ct Head Wo Contrast  10/14/2015  CLINICAL DATA:  Slurred speech.  Dizziness.  Bilateral leg numbness. EXAM: CT HEAD WITHOUT CONTRAST TECHNIQUE: Contiguous axial images were obtained from the base of the skull through the vertex without intravenous contrast. COMPARISON:  None. FINDINGS: No evidence of intracranial hemorrhage, brain edema, or other signs of acute infarction. No evidence of intracranial mass lesion or mass effect. No abnormal extraaxial fluid collections identified. Ventricles are normal in size. No skull abnormality identified. IMPRESSION: Negative noncontrast head CT. Electronically Signed   By: Myles Rosenthal M.D.   On: 10/14/2015 19:06   I have personally reviewed and evaluated these images and lab results as part of my medical decision-making.   EKG Interpretation   Date/Time:  Saturday October 14 2015 14:34:35 EST Ventricular Rate:  72 PR Interval:  166 QRS Duration: 84 QT Interval:  392 QTC Calculation: 429 R Axis:   40 Text Interpretation:  Normal sinus  rhythm with sinus arrhythmia Normal ECG  No previous ECGs available Confirmed  by Shaunte Weissinger  MD, Georgine Wiltse [phone]) on  10/14/2015 4:39:34 PM      MDM   Final diagnoses:  Numbness in both legs   Discussed with neuro hospitalist. Since patient has no lower extremity reflux abnormalities feel that this is not gallop or rape. But wanted a MRI of the cervical spine to rule out transverse myelitis. Patient is refusing the MRI. Offered to give her Ativan to help with her anxiety. Still refused. States she is once to go home. She stands but some unanswered questions. That neurology wanted to have answered. She states she will return for any new or worse symptoms. Will follow up with neurology as an outpatient.  Rest the patient's workup without any acute findings. Patient states that the numbness it had reached the level of her umbilicus of the abdomen bilateral lower extremity glovelike is a improving and now is down to her upper thigh area. Rest of workup without any significant findings.   The neurologist that I consult that was Dr. Amada Jupiter. Plan was to get the MRI of the neck to rule out transverse myelitis. That was negative patient could be discharged home with follow-up.   Vanetta Mulders, MD 10/14/15 6045  Vanetta Mulders, MD 10/14/15 2356

## 2015-10-14 NOTE — ED Notes (Signed)
Pt c/o numbness that started in B/L feet and moved up to waist onset Yesterday. Pt also reports dizziness. Pt denies incontinence.

## 2015-10-14 NOTE — Discharge Instructions (Signed)
The neurologist recommended the MRI of your neck to rule out transverse myelitis. Patient refusing the MRI. Patient understands the potential risk. Will sign her out AMA give her referral to follow-up with neurology. States she will return for any new or worse symptoms.

## 2015-10-15 NOTE — ED Notes (Signed)
Patient left at this time with all belongings. 

## 2015-11-03 ENCOUNTER — Ambulatory Visit: Payer: Medicaid Other | Admitting: Neurology

## 2015-11-10 ENCOUNTER — Emergency Department (INDEPENDENT_AMBULATORY_CARE_PROVIDER_SITE_OTHER)
Admission: EM | Admit: 2015-11-10 | Discharge: 2015-11-10 | Disposition: A | Discharge status: Home / Self Care | Payer: Medicaid Other | Source: Home / Self Care | Attending: Emergency Medicine | Admitting: Emergency Medicine

## 2015-11-10 ENCOUNTER — Encounter (HOSPITAL_COMMUNITY): Payer: Self-pay | Admitting: Emergency Medicine

## 2015-11-10 DIAGNOSIS — H8110 Benign paroxysmal vertigo, unspecified ear: Secondary | ICD-10-CM

## 2015-11-10 MED ORDER — MECLIZINE HCL 25 MG PO TABS: 25.0000 mg | ORAL_TABLET | Freq: Three times a day (TID) | ORAL | Status: DC | PRN

## 2015-11-10 MED ORDER — ONDANSETRON HCL 4 MG PO TABS: 4.0000 mg | ORAL_TABLET | Freq: Four times a day (QID) | ORAL | Status: DC

## 2015-11-10 NOTE — ED Provider Notes (Signed)
CSN: [id number]     Arrival date & time 11/10/15  1454 History   First MD Initiated Contact with Patient 11/10/15 1719     Chief Complaint  Patient presents with  . Dizziness   (Consider location/radiation/quality/duration/timing/severity/associated sxs/prior Treatment) HPI Comments: 30 year old female complaining of vertigo for one week. It is associated with poor balance and ability to walk without holding on to something and nausea. She states it is constant. A little better when lying supine oral her side. Worse with standing. Exacerbated with head movements and position changes. She also describes nystagmus with her eyes moving or jumping to the side. Denies diplopia. She has a mild headache. Denies focal paresthesias or weakness. She had an episode of vertigo approximately one month ago associated with lower body paresthesia. She was seen in the emergency department and a CT of the head was completed as well has a chest x-ray and a battery of lab tests. No abnormalities were found. Her physical exam was essentially normal. After consultation with the neurologist it was recommended that she have an MRI of the cervical spine. She refused and left AMA. It was also recommended that she follow-up with a neurologist. She obtained an appointment but on the day of the appointment she stated she felt too sick to go to the doctor. She called the doctor to reschedule and they stated they would call her back when they had an appointment for her to come in. Denies problems with memory, concentration or orientation. Denies head trauma.   Past Medical History  Diagnosis Date  . SVD (spontaneous vaginal delivery)     x 5  . Depression     no meds currently   Past Surgical History  Procedure Laterality Date  . Right hand surgery    . Laparoscopic tubal ligation Bilateral 01/06/2013    Procedure: LAPAROSCOPIC TUBAL LIGATION;  Surgeon: Kathreen Cosier, MD;  Location: [facility];  Service: Gynecology;   Laterality: Bilateral;   Family History  Problem Relation Age of Onset  . Diabetes Mother   . Heart disease Mother   . Diabetes Father    Social History  Substance Use Topics  . Smoking status: Current Some Day Smoker -- 0.10 packs/day    Types: Cigarettes  . Smokeless tobacco: Never Used  . Alcohol Use: No   OB History    Gravida Para Term Preterm AB TAB SAB Ectopic Multiple Living   5 5 4 1  0 0 0 0 0 5     Review of Systems  Constitutional: Positive for activity change and appetite change. Negative for fever.  HENT: Negative for congestion, ear pain, postnasal drip, sore throat and trouble swallowing.   Eyes: Positive for photophobia. Negative for discharge.  Respiratory: Negative.   Cardiovascular: Negative.   Gastrointestinal: Positive for nausea. Negative for abdominal pain.  Genitourinary: Negative.   Musculoskeletal: Positive for gait problem. Negative for myalgias, back pain and neck stiffness.       Mild soreness posterior neck with flexion and extension. No stiffness or rigidity. Demonstrates full range of motion left and right with rotation.  Neurological: Positive for dizziness, weakness and headaches. Negative for tremors, seizures, syncope, facial asymmetry, speech difficulty and numbness.  All other systems reviewed and are negative.   Allergies  Penicillins  Home Medications   Prior to Admission medications   Medication Sig Start Date End Date Taking? Authorizing Provider  acetaminophen (TYLENOL) 500 MG tablet Take 500 mg by mouth every 6 (six) hours as needed  for mild pain.     Historical Provider, MD  gabapentin (NEURONTIN) 100 MG capsule Take 2 capsules (200 mg total) by mouth at bedtime. Patient not taking: Reported on 10/14/2015 05/23/15   Rhetta Mura, MD  meclizine (ANTIVERT) 25 MG tablet Take 1 tablet (25 mg total) by mouth 3 (three) times daily as needed for dizziness. 11/10/15   Hayden Rasmussen, NP  ondansetron (ZOFRAN) 4 MG tablet Take 1 tablet  (4 mg total) by mouth every 6 (six) hours. Prn nausea or vomiting 11/10/15   Hayden Rasmussen, NP   Meds Ordered and Administered this Visit  Medications - No data to display  BP 115/74 mmHg  Pulse 67  Temp(Src) 97.9 F (36.6 C) (Oral)  Resp 16  SpO2 100%  LMP 10/31/2015 No data found.   Physical Exam  Constitutional: She is oriented to person, place, and time. She appears well-developed and well-nourished. No distress.  HENT:  Head: Normocephalic and atraumatic.  Right Ear: External ear normal.  Left Ear: External ear normal.  Mouth/Throat: Oropharynx is clear and moist. No oropharyngeal exudate.  Soft palate rises symmetrically. Tongue and uvula midline. Swallowing reflex normal. Normal tongue control and movement.  Eyes: Conjunctivae and EOM are normal. Pupils are equal, round, and reactive to light. Right eye exhibits no discharge. Left eye exhibits no discharge.  Neck: Normal range of motion. Neck supple. No thyromegaly present.  Cardiovascular: Normal rate, regular rhythm, normal heart sounds and intact distal pulses.   No murmur heard. Pulmonary/Chest: Effort normal and breath sounds normal. No respiratory distress. She has no wheezes. She has no rales.  Abdominal: Soft. There is no tenderness.  Musculoskeletal: Normal range of motion. She exhibits no edema or tenderness.  Lymphadenopathy:    She has no cervical adenopathy.  Neurological: She is alert and oriented to person, place, and time. She has normal strength. No cranial nerve deficit or sensory deficit. She exhibits normal muscle tone. She displays no seizure activity. Coordination and gait abnormal.  Positive Romberg. Unable to perform heel to toe without falling out of place. Having patient turn his left or right or change body positions produces nystagmus and vertiginous symptoms.  Nursing note and vitals reviewed.   ED Course  Procedures (including critical care time)  Labs Review Labs Reviewed - No data to  display  Imaging Review No results found.   Visual Acuity Review  Right Eye Distance:   Left Eye Distance:   Bilateral Distance:    Right Eye Near:   Left Eye Near:    Bilateral Near:         MDM   1. BPV (benign positional vertigo), unspecified laterality    SEEK IMMEDIATE MEDICAL CARE IF:   Go to the ED.  You have difficulty speaking or moving.  You are always dizzy.  You faint.  You develop severe headaches.  You have weakness in your legs or arms.  You have changes in your hearing or vision.  You develop a stiff neck.  You develop sensitivity to light.   Epply's maneuver at home Meds ordered this encounter  Medications  . meclizine (ANTIVERT) 25 MG tablet    Sig: Take 1 tablet (25 mg total) by mouth 3 (three) times daily as needed for dizziness.    Dispense:  30 tablet    Refill:  0    Order Specific Question:  Supervising Provider    Answer:  Charm Rings [id number]  . ondansetron (ZOFRAN) 4 MG tablet  Sig: Take 1 tablet (4 mg total) by mouth every 6 (six) hours. Prn nausea or vomiting    Dispense:  12 tablet    Refill:  0    Order Specific Question:  Supervising Provider    Answer:  Charm Rings [[id number]]   See your neurologist ASAP, call Monday    Hayden Rasmussen, NP 11/10/15 1806

## 2015-11-10 NOTE — Discharge Instructions (Signed)
Benign Positional Vertigo °Vertigo is the feeling that you or your surroundings are moving when they are not. Benign positional vertigo is the most common form of vertigo. The cause of this condition is not serious (is benign). This condition is triggered by certain movements and positions (is positional). This condition can be dangerous if it occurs while you are doing something that could endanger you or others, such as driving.  °CAUSES °In many cases, the cause of this condition is not known. It may be caused by a disturbance in an area of the inner ear that helps your brain to sense movement and balance. This disturbance can be caused by a viral infection (labyrinthitis), head injury, or repetitive motion. °RISK FACTORS °This condition is more likely to develop in: °· Women. °· People who are 50 years of age or older. °SYMPTOMS °Symptoms of this condition usually happen when you move your head or your eyes in different directions. Symptoms may start suddenly, and they usually last for less than a minute. Symptoms may include: °· Loss of balance and falling. °· Feeling like you are spinning or moving. °· Feeling like your surroundings are spinning or moving. °· Nausea and vomiting. °· Blurred vision. °· Dizziness. °· Involuntary eye movement (nystagmus). °Symptoms can be mild and cause only slight annoyance, or they can be severe and interfere with daily life. Episodes of benign positional vertigo may return (recur) over time, and they may be triggered by certain movements. Symptoms may improve over time. °DIAGNOSIS °This condition is usually diagnosed by medical history and a physical exam of the head, neck, and ears. You may be referred to a health care provider who specializes in ear, nose, and throat (ENT) problems (otolaryngologist) or a provider who specializes in disorders of the nervous system (neurologist). You may have additional testing, including: °· MRI. °· A CT scan. °· Eye movement tests. Your  health care provider may ask you to change positions quickly while he or she watches you for symptoms of benign positional vertigo, such as nystagmus. Eye movement may be tested with an electronystagmogram (ENG), caloric stimulation, the Dix-Hallpike test, or the roll test. °· An electroencephalogram (EEG). This records electrical activity in your brain. °· Hearing tests. °TREATMENT °Usually, your health care provider will treat this by moving your head in specific positions to adjust your inner ear back to normal. Surgery may be needed in severe cases, but this is rare. In some cases, benign positional vertigo may resolve on its own in 2-4 weeks. °HOME CARE INSTRUCTIONS °Safety °· Move slowly. Avoid sudden body or head movements. °· Avoid driving. °· Avoid operating heavy machinery. °· Avoid doing any tasks that would be dangerous to you or others if a vertigo episode would occur. °· If you have trouble walking or keeping your balance, try using a cane for stability. If you feel dizzy or unstable, sit down right away. °· Return to your normal activities as told by your health care provider. Ask your health care provider what activities are safe for you. °General Instructions °· Take over-the-counter and prescription medicines only as told by your health care provider. °· Avoid certain positions or movements as told by your health care provider. °· Drink enough fluid to keep your urine clear or pale yellow. °· Keep all follow-up visits as told by your health care provider. This is important. °SEEK MEDICAL CARE IF: °· You have a fever. °· Your condition gets worse or you develop new symptoms. °· Your family or friends   notice any behavioral changes.  Your nausea or vomiting gets worse.  You have numbness or a "pins and needles" sensation. SEEK IMMEDIATE MEDICAL CARE IF:  You have difficulty speaking or moving.  You are always dizzy.  You faint.  You develop severe headaches.  You have weakness in your  legs or arms.  You have changes in your hearing or vision.  You develop a stiff neck.  You develop sensitivity to light.   This information is not intended to replace advice given to you by your health care provider. Make sure you discuss any questions you have with your health care provider.   Document Released: 05/06/2006 Document Revised: 04/19/2015 Document Reviewed: 11/21/2014 Elsevier Interactive Patient Education 2016 [employer].  [employer] [facility] WHAT IS THE EPLEY MANEUVER? The Epley maneuver is an exercise you can do to relieve symptoms of benign paroxysmal positional vertigo (BPPV). This condition is often just referred to as vertigo. BPPV is caused by the movement of tiny crystals (canaliths) inside your inner ear. The accumulation and movement of canaliths in your inner ear causes a sudden spinning sensation (vertigo) when you move your head to certain positions. Vertigo usually lasts about 30 seconds. BPPV usually occurs in just one ear. If you get vertigo when you lie on your left side, you probably have BPPV in your left ear. Your health care provider can tell you which ear is involved.  BPPV may be caused by a head injury. Many people older than 50 get BPPV for unknown reasons. If you have been diagnosed with BPPV, your health care provider may teach you how to do this maneuver. BPPV is not life threatening (benign) and usually goes away in time.  WHEN SHOULD I PERFORM THE EPLEY MANEUVER? You can do this maneuver at home whenever you have symptoms of vertigo. You may do the Epley maneuver up to 3 times a day until your symptoms of vertigo go away. HOW SHOULD I DO THE EPLEY MANEUVER?  Sit on the edge of a bed or table with your back straight. Your legs should be extended or hanging over the edge of the bed or table.   Turn your head halfway toward the affected ear.   Lie backward quickly with your head turned until you are lying flat on your back. You may want to  position a pillow under your shoulders.   Hold this position for 30 seconds. You may experience an attack of vertigo. This is normal. Hold this position until the vertigo stops.  Then turn your head to the opposite direction until your unaffected ear is facing the floor.   Hold this position for 30 seconds. You may experience an attack of vertigo. This is normal. Hold this position until the vertigo stops.  Now turn your whole body to the same side as your head. Hold for another 30 seconds.   You can then sit back up. ARE THERE RISKS TO THIS MANEUVER? In some cases, you may have other symptoms (such as changes in your vision, weakness, or numbness). If you have these symptoms, stop doing the maneuver and call your health care provider. Even if doing these maneuvers relieves your vertigo, you may still have dizziness. Dizziness is the sensation of light-headedness but without the sensation of movement. Even though the Epley maneuver may relieve your vertigo, it is possible that your symptoms will return within 5 years. WHAT SHOULD I DO AFTER THIS MANEUVER? After doing the Epley maneuver, you can return to your  normal activities. Ask your doctor if there is anything you should do at home to prevent vertigo. This may include:  Sleeping with two or more pillows to keep your head elevated.  Not sleeping on the side of your affected ear.  Getting up slowly from bed.  Avoiding sudden movements during the day.  Avoiding extreme head movement, like looking up or bending over.  Wearing a cervical collar to prevent sudden head movements. WHAT SHOULD I DO IF MY SYMPTOMS GET WORSE? Call your health care provider if your vertigo gets worse. Call your provider right way if you have other symptoms, including:   Nausea.  Vomiting.  Headache.  Weakness.  Numbness.  Vision changes.   This information is not intended to replace advice given to you by your health care provider. Make sure you  discuss any questions you have with your health care provider.   Document Released: 08/03/2013 Document Reviewed: 08/03/2013 Elsevier Interactive Patient Education [employer]  Vertigo Vertigo means you feel like you or your surroundings are moving when they are not. Vertigo can be dangerous if it occurs when you are at work, driving, or performing difficult activities.  CAUSES  Vertigo occurs when there is a conflict of signals sent to your brain from the visual and sensory systems in your body. There are many different causes of vertigo, including:  Infections, especially in the inner ear.  A bad reaction to a drug or misuse of alcohol and medicines.  Withdrawal from drugs or alcohol.  Rapidly changing positions, such as lying down or rolling over in bed.  A migraine headache.  Decreased blood flow to the brain.  Increased pressure in the brain from a head injury, infection, tumor, or bleeding. SYMPTOMS  You may feel as though the world is spinning around or you are falling to the ground. Because your balance is upset, vertigo can cause nausea and vomiting. You may have involuntary eye movements (nystagmus). DIAGNOSIS  Vertigo is usually diagnosed by physical exam. If the cause of your vertigo is unknown, your caregiver may perform imaging tests, such as an MRI scan (magnetic resonance imaging). TREATMENT  Most cases of vertigo resolve on their own, without treatment. Depending on the cause, your caregiver may prescribe certain medicines. If your vertigo is related to body position issues, your caregiver may recommend movements or procedures to correct the problem. In rare cases, if your vertigo is caused by certain inner ear problems, you may need surgery. HOME CARE INSTRUCTIONS   Follow your caregiver's instructions.  Avoid driving.  Avoid operating heavy machinery.  Avoid performing any tasks that would be dangerous to you or others during a vertigo episode.  Tell  your caregiver if you notice that certain medicines seem to be causing your vertigo. Some of the medicines used to treat vertigo episodes can actually make them worse in some people. SEEK IMMEDIATE MEDICAL CARE IF:   Your medicines do not relieve your vertigo or are making it worse.  You develop problems with talking, walking, weakness, or using your arms, hands, or legs.  You develop severe headaches.  Your nausea or vomiting continues or gets worse.  You develop visual changes.  A family member notices behavioral changes.  Your condition gets worse. MAKE SURE YOU:  Understand these instructions.  Will watch your condition.  Will get help right away if you are not doing well or get worse.   This information is not intended to replace advice given to you by your  health care provider. Make sure you discuss any questions you have with your health care provider.   Document Released: 05/08/2005 Document Revised: 10/21/2011 Document Reviewed: 11/21/2014 Elsevier Interactive Patient Education [employer]

## 2015-11-10 NOTE — ED Notes (Signed)
Concerns for dizziness, nausea, no vomiting, abdominal soreness and headache.  Patient has had diarrhea.  Reports diarrhea since Tuesday.  No diarrhea today.  Symptoms started last Thursday.

## 2015-11-15 ENCOUNTER — Emergency Department (HOSPITAL_COMMUNITY): Payer: Medicaid Other

## 2015-11-15 ENCOUNTER — Encounter (HOSPITAL_COMMUNITY): Payer: Self-pay

## 2015-11-15 ENCOUNTER — Inpatient Hospital Stay (HOSPITAL_COMMUNITY)
Admission: EM | Admit: 2015-11-15 | Discharge: 2015-11-20 | DRG: 060 | Disposition: A | Discharge status: Home / Self Care | Payer: Medicaid Other | Attending: Family Medicine | Admitting: Family Medicine

## 2015-11-15 DIAGNOSIS — G43909 Migraine, unspecified, not intractable, without status migrainosus: Secondary | ICD-10-CM | POA: Diagnosis present

## 2015-11-15 DIAGNOSIS — Z8249 Family history of ischemic heart disease and other diseases of the circulatory system: Secondary | ICD-10-CM | POA: Diagnosis not present

## 2015-11-15 DIAGNOSIS — F17210 Nicotine dependence, cigarettes, uncomplicated: Secondary | ICD-10-CM | POA: Diagnosis present

## 2015-11-15 DIAGNOSIS — R51 Headache: Secondary | ICD-10-CM | POA: Diagnosis present

## 2015-11-15 DIAGNOSIS — Z833 Family history of diabetes mellitus: Secondary | ICD-10-CM

## 2015-11-15 DIAGNOSIS — G35 Multiple sclerosis: Secondary | ICD-10-CM | POA: Diagnosis present

## 2015-11-15 DIAGNOSIS — G379 Demyelinating disease of central nervous system, unspecified: Secondary | ICD-10-CM | POA: Diagnosis not present

## 2015-11-15 DIAGNOSIS — R42 Dizziness and giddiness: Secondary | ICD-10-CM | POA: Insufficient documentation

## 2015-11-15 DIAGNOSIS — Z880 Allergy status to penicillin: Secondary | ICD-10-CM

## 2015-11-15 DIAGNOSIS — F329 Major depressive disorder, single episode, unspecified: Secondary | ICD-10-CM | POA: Diagnosis present

## 2015-11-15 DIAGNOSIS — H5500 Unspecified nystagmus: Secondary | ICD-10-CM | POA: Diagnosis present

## 2015-11-15 LAB — CBC WITH DIFFERENTIAL/PLATELET
Basophils Absolute: 0 10*3/uL (ref 0.0–0.1)
Basophils Relative: 0 %
EOS ABS: 0.2 10*3/uL (ref 0.0–0.7)
EOS PCT: 4 %
HCT: 37.3 % (ref 36.0–46.0)
HEMOGLOBIN: 12.4 g/dL (ref 12.0–15.0)
LYMPHS ABS: 1.8 10*3/uL (ref 0.7–4.0)
LYMPHS PCT: 30 %
MCH: 28.8 pg (ref 26.0–34.0)
MCHC: 33.2 g/dL (ref 30.0–36.0)
MCV: 86.5 fL (ref 78.0–100.0)
MONOS PCT: 7 %
Monocytes Absolute: 0.4 10*3/uL (ref 0.1–1.0)
Neutro Abs: 3.4 10*3/uL (ref 1.7–7.7)
Neutrophils Relative %: 59 %
PLATELETS: 227 10*3/uL (ref 150–400)
RBC: 4.31 MIL/uL (ref 3.87–5.11)
RDW: 13.7 % (ref 11.5–15.5)
WBC: 5.9 10*3/uL (ref 4.0–10.5)

## 2015-11-15 LAB — BASIC METABOLIC PANEL
Anion gap: 12 (ref 5–15)
BUN: 7 mg/dL (ref 6–20)
CHLORIDE: 102 mmol/L (ref 101–111)
CO2: 21 mmol/L — ABNORMAL LOW (ref 22–32)
CREATININE: 0.6 mg/dL (ref 0.44–1.00)
Calcium: 9.4 mg/dL (ref 8.9–10.3)
GFR calc Af Amer: >60 mL/min (ref >60)
GFR calc non Af Amer: >60 mL/min (ref >60)
GLUCOSE: 82 mg/dL (ref 65–99)
POTASSIUM: 3.7 mmol/L (ref 3.5–5.1)
SODIUM: 135 mmol/L (ref 135–145)

## 2015-11-15 LAB — POC URINE PREG, ED: Preg Test, Ur: NEGATIVE

## 2015-11-15 MED ORDER — GADOBENATE DIMEGLUMINE 529 MG/ML IV SOLN
20.0000 mL | Freq: Once | INTRAVENOUS | Status: AC | PRN
Start: 2015-11-15 — End: 2015-11-15
  Administered 2015-11-15: 20 mL via INTRAVENOUS

## 2015-11-15 MED ORDER — SODIUM CHLORIDE 0.9 % IV SOLN
1000.0000 mg | Freq: Every day | INTRAVENOUS | Status: DC
  Filled 2015-11-15: qty 8

## 2015-11-15 MED ORDER — POLYETHYLENE GLYCOL 3350 17 G PO PACK
17.0000 g | PACK | Freq: Every day | ORAL | Status: DC | PRN
  Administered 2015-11-15 – 2015-11-17 (×2): 17 g via ORAL
  Filled 2015-11-15 (×2): qty 1

## 2015-11-15 MED ORDER — SODIUM CHLORIDE 0.9 % IV BOLUS (SEPSIS)
1000.0000 mL | Freq: Once | INTRAVENOUS | Status: AC
  Administered 2015-11-15: 1000 mL via INTRAVENOUS

## 2015-11-15 MED ORDER — DIAZEPAM 5 MG/ML IJ SOLN
5.0000 mg | Freq: Once | INTRAMUSCULAR | Status: AC
  Administered 2015-11-15: 5 mg via INTRAVENOUS
  Filled 2015-11-15: qty 2

## 2015-11-15 MED ORDER — KETOROLAC TROMETHAMINE 10 MG PO TABS
10.0000 mg | ORAL_TABLET | Freq: Four times a day (QID) | ORAL | Status: AC
  Administered 2015-11-15 – 2015-11-16 (×2): 10 mg via ORAL
  Filled 2015-11-15 (×2): qty 1

## 2015-11-15 MED ORDER — SODIUM CHLORIDE 0.9 % IV SOLN
1000.0000 mg | Freq: Once | INTRAVENOUS | Status: AC
  Administered 2015-11-15: 1000 mg via INTRAVENOUS
  Filled 2015-11-15: qty 8

## 2015-11-15 MED ORDER — ENOXAPARIN SODIUM 40 MG/0.4ML ~~LOC~~ SOLN
40.0000 mg | Freq: Every day | SUBCUTANEOUS | Status: DC
  Administered 2015-11-16 – 2015-11-20 (×5): 40 mg via SUBCUTANEOUS
  Filled 2015-11-15 (×5): qty 0.4

## 2015-11-15 NOTE — ED Notes (Signed)
Pt on MRI, rounding given family member at the bedside.

## 2015-11-15 NOTE — Consult Note (Signed)
NEURO HOSPITALIST CONSULT NOTE      Reason for Consult: MRI concerning for MS   History obtained from:  Patient     HPI:                                                                                                                                          Alicia Barker is an 30 y.o. female with hx as below presents with a 5 day feeling of oscillopsia and vertigo and has been slowly improving. She states that over the years she has had episodes of sensory disturbances along with stiffness feeling in her extremities that lasted over a week but always resolved.  Past Medical History  Diagnosis Date  . SVD (spontaneous vaginal delivery)     x 5  . Depression     no meds currently    Past Surgical History  Procedure Laterality Date  . Right hand surgery    . Laparoscopic tubal ligation Bilateral 01/06/2013    Procedure: LAPAROSCOPIC TUBAL LIGATION;  Surgeon: Kathreen Cosier, MD;  Location: [facility];  Service: Gynecology;  Laterality: Bilateral;    Family History  Problem Relation Age of Onset  . Diabetes Mother   . Heart disease Mother   . Diabetes Father       Social History:  reports that she has been smoking Cigarettes.  She has been smoking about 0.10 packs per day. She has never used smokeless tobacco. She reports that she does not drink alcohol or use illicit drugs.  Allergies  Allergen Reactions  . Penicillins Anaphylaxis, Shortness Of Breath and Swelling    Has patient had a PCN reaction causing immediate rash, facial/tongue/throat swelling, SOB or lightheadedness with hypotension: YES Has patient had a PCN reaction causing severe rash involving mucus membranes or skin necrosis: NO Has patient had a PCN reaction that required hospitalization NO Has patient had a PCN reaction occurring within the last 10 years: NO If all of the above answers are "NO", then may proceed with Cephalosporin use.    MEDICATIONS:                                                                                                                      I have reviewed the patient's current  medications.   ROS:                                                                                                                                       History obtained from the patient  General ROS: negative for - chills, fatigue, fever, night sweats, weight gain or weight loss Psychological ROS: negative for - behavioral disorder, hallucinations, memory difficulties, mood swings or suicidal ideation Ophthalmic ROS: negative for - blurry vision, double vision, eye pain or loss of vision ENT ROS: negative for - epistaxis, nasal discharge, oral lesions, sore throat, tinnitus or vertigo Allergy and Immunology ROS: negative for - hives or itchy/watery eyes Hematological and Lymphatic ROS: negative for - bleeding problems, bruising or swollen lymph nodes Endocrine ROS: negative for - galactorrhea, hair pattern changes, polydipsia/polyuria or temperature intolerance Respiratory ROS: negative for - cough, hemoptysis, shortness of breath or wheezing Cardiovascular ROS: negative for - chest pain, dyspnea on exertion, edema or irregular heartbeat Gastrointestinal ROS: negative for - abdominal pain, diarrhea, hematemesis, nausea/vomiting or stool incontinence Genito-Urinary ROS: negative for - dysuria, hematuria, incontinence or urinary frequency/urgency Musculoskeletal ROS: negative for - joint swelling or muscular weakness Neurological ROS: as noted in HPI Dermatological ROS: negative for rash and skin lesion changes   Blood pressure 121/86, pulse 57, temperature 98 F (36.7 C), temperature source Oral, resp. rate 19, last menstrual period 10/31/2015, SpO2 100 %.   Neurologic Examination:                                                                                                      HEENT-  Normocephalic, no lesions, without obvious abnormality.  Normal external eye and  conjunctiva.  Normal TM's bilaterally.  Normal auditory canals and external ears. Normal external nose, mucus membranes and septum.  Normal pharynx. Cardiovascular- regular rate and rhythm, S1, S2 normal, no murmur, click, rub or gallop, pulses palpable throughout   Lungs- chest clear, no wheezing, rales, normal symmetric air entry, Heart exam - S1, S2 normal, no murmur, no gallop, rate regular Abdomen- soft, non-tender; bowel sounds normal; no masses,  no organomegaly Extremities- no edema Lymph-no adenopathy palpable Musculoskeletal-no joint tenderness, deformity or swelling Skin-warm and dry, no hyperpigmentation, vitiligo, or suspicious lesions  Neurological Examination Mental Status: Alert, oriented, thought content appropriate.  Speech fluent without evidence of aphasia.  Able to follow 2 step commands without difficulty. Cranial Nerves: II: Discs flat bilaterally; Visual fields grossly normal, pupils equal, round, reactive to light  L  beating nystagmus and nystagmus on upward gaze that does not extinguish  III,IV, VI: ptosis not present, extra-ocular motions intact bilaterally V,VII: Mild R facial droop, facial light touch sensation normal bilaterally VIII: hearing normal bilaterally IX,X: uvula rises symmetrically XI: bilateral shoulder shrug XII: midline tongue extension Motor: Right : Upper extremity   5/5    Left:     Upper extremity   5/5  Lower extremity   5/5     Lower extremity   5/5 Tone and bulk:normal tone throughout; no atrophy noted Sensory: Pinprick and light touch intact throughout, bilaterally Cerebellar: normal finger-to-nose, normal rapid alternating movements and normal heel-to-shin test       Lab Results: Basic Metabolic Panel:  Recent Labs Lab 11/15/15 1830  NA 135  K 3.7  CL 102  CO2 21*  GLUCOSE 82  BUN 7  CREATININE 0.60  CALCIUM 9.4    Liver Function Tests: No results for input(s): AST, ALT, ALKPHOS, BILITOT, PROT, ALBUMIN in the last  168 hours. No results for input(s): LIPASE, AMYLASE in the last 168 hours. No results for input(s): AMMONIA in the last 168 hours.  CBC:  Recent Labs Lab 11/15/15 1830  WBC 5.9  NEUTROABS 3.4  HGB 12.4  HCT 37.3  MCV 86.5  PLT 227    Cardiac Enzymes: No results for input(s): CKTOTAL, CKMB, CKMBINDEX, TROPONINI in the last 168 hours.  Lipid Panel: No results for input(s): CHOL, TRIG, HDL, CHOLHDL, VLDL, LDLCALC in the last 168 hours.  CBG: No results for input(s): GLUCAP in the last 168 hours.  Microbiology: Results for orders placed or performed during the hospital encounter of 01/01/13  Surgical pcr screen     Status: None   Collection Time: 01/01/13 10:51 AM  Result Value Ref Range Status   MRSA, PCR NEGATIVE NEGATIVE Final   Staphylococcus aureus NEGATIVE NEGATIVE Final    Comment:        The Xpert SA Assay (FDA approved for NASAL specimens in patients over 15 years of age), is one component of a comprehensive surveillance program.  Test performance has been validated by [employer] for patients greater than or equal to 56 year old. It is not intended to diagnose infection nor to guide or monitor treatment.    Coagulation Studies: No results for input(s): LABPROT, INR in the last 72 hours.  Imaging: Mr Angiogram Head Wo Contrast  11/15/2015  CLINICAL DATA:  Vertigo. Headache for 1 week. Prior bilateral lower extremity numbness. EXAM: MRI HEAD WITHOUT AND WITH CONTRAST MRA HEAD WITHOUT CONTRAST MRA NECK WITHOUT AND WITH CONTRAST TECHNIQUE: Multiplanar, multiecho pulse sequences of the brain and surrounding structures were obtained without and with intravenous contrast. Angiographic images of the Circle of Willis were obtained using MRA technique without intravenous contrast. Angiographic images of the neck were obtained using MRA technique without and with intravenous contrast. Carotid stenosis measurements (when applicable) are obtained utilizing NASCET  criteria, using the distal internal carotid diameter as the denominator. CONTRAST:  20mL MULTIHANCE GADOBENATE DIMEGLUMINE 529 MG/ML IV SOLN The patient experienced nausea during the IV contrast injection which resulted in disrupted contrast timing on the neck MRA. COMPARISON:  Head CT 10/14/2015 FINDINGS: MRI HEAD FINDINGS There are patchy T2 hyperintensities throughout the cerebral white matter bilaterally. There are multiple periventricular lesions which are oriented perpendicularly to the lateral ventricles. Multiple corpus callosum lesions are present, including an 8 mm lesion in the body on the right which demonstrates restricted diffusion and faint enhancement. Multiple other lesions  demonstrate T2 shine through on diffusion imaging. There are also multiple enhancing lesions in both cerebral hemispheres, the largest measuring 1.5 cm in the left frontal lobe. Juxtacortical lesions are present. There is an 8 mm nonenhancing lesion in the right pons, and there is a 7 mm lesion at the pontomedullary junction. Multiple lesions are suspected in the cervical spinal cord. There is no midline shift, intracranial hemorrhage, or extra-axial fluid collection. Ventricles and sulci are within normal limits. Orbits are unremarkable on this nondedicated study. Mild mucosal thickening is noted in the ethmoid air cells. The mastoid air cells are clear. Major intracranial vascular flow voids are preserved. MRA HEAD FINDINGS Mild motion artifact. The intracranial vertebral arteries are widely patent to the vertebrobasilar junction. Left PICA and right AICA are dominant. SCA origins are patent. Basilar artery is patent without stenosis. There is a patent right posterior communicating artery. PCAs are patent without evidence of major branch occlusion or significant proximal stenosis. The internal carotid arteries are patent from skullbase to carotid termini without stenosis. ACAs and MCAs are patent without evidence of major  branch occlusion or significant proximal stenosis. Branch vessel evaluation is mildly limited by motion artifact. No intracranial aneurysm is identified. MRA NECK FINDINGS Normal variant 4 vessel aortic arch with the left vertebral artery arising directly from the arch. Brachiocephalic and subclavian arteries appear widely patent, although the proximal left subclavian artery was incompletely imaged. Common carotid and cervical internal carotid arteries are widely patent bilaterally. Vertebral arteries are patent and codominant with antegrade flow bilaterally. No vertebral artery stenosis is identified. IMPRESSION: 1. Numerous supratentorial and infratentorial brain lesions consistent with demyelinating disease/multiple sclerosis. Enhancing/restricted lesions are consistent with active demyelination. 2. Multiple cervical spinal cord lesions suspected. 3. No evidence of significant intracranial or extracranial arterial stenosis, dissection, or major branch vessel occlusion. Electronically Signed   By: Sebastian Ache M.D.   On: 11/15/2015 21:51   Mr Angiogram Neck W Wo Contrast  11/15/2015  CLINICAL DATA:  Vertigo. Headache for 1 week. Prior bilateral lower extremity numbness. EXAM: MRI HEAD WITHOUT AND WITH CONTRAST MRA HEAD WITHOUT CONTRAST MRA NECK WITHOUT AND WITH CONTRAST TECHNIQUE: Multiplanar, multiecho pulse sequences of the brain and surrounding structures were obtained without and with intravenous contrast. Angiographic images of the Circle of Willis were obtained using MRA technique without intravenous contrast. Angiographic images of the neck were obtained using MRA technique without and with intravenous contrast. Carotid stenosis measurements (when applicable) are obtained utilizing NASCET criteria, using the distal internal carotid diameter as the denominator. CONTRAST:  53mL MULTIHANCE GADOBENATE DIMEGLUMINE 529 MG/ML IV SOLN The patient experienced nausea during the IV contrast injection which resulted  in disrupted contrast timing on the neck MRA. COMPARISON:  Head CT 10/14/2015 FINDINGS: MRI HEAD FINDINGS There are patchy T2 hyperintensities throughout the cerebral white matter bilaterally. There are multiple periventricular lesions which are oriented perpendicularly to the lateral ventricles. Multiple corpus callosum lesions are present, including an 8 mm lesion in the body on the right which demonstrates restricted diffusion and faint enhancement. Multiple other lesions demonstrate T2 shine through on diffusion imaging. There are also multiple enhancing lesions in both cerebral hemispheres, the largest measuring 1.5 cm in the left frontal lobe. Juxtacortical lesions are present. There is an 8 mm nonenhancing lesion in the right pons, and there is a 7 mm lesion at the pontomedullary junction. Multiple lesions are suspected in the cervical spinal cord. There is no midline shift, intracranial hemorrhage, or extra-axial fluid collection. Ventricles and  sulci are within normal limits. Orbits are unremarkable on this nondedicated study. Mild mucosal thickening is noted in the ethmoid air cells. The mastoid air cells are clear. Major intracranial vascular flow voids are preserved. MRA HEAD FINDINGS Mild motion artifact. The intracranial vertebral arteries are widely patent to the vertebrobasilar junction. Left PICA and right AICA are dominant. SCA origins are patent. Basilar artery is patent without stenosis. There is a patent right posterior communicating artery. PCAs are patent without evidence of major branch occlusion or significant proximal stenosis. The internal carotid arteries are patent from skullbase to carotid termini without stenosis. ACAs and MCAs are patent without evidence of major branch occlusion or significant proximal stenosis. Branch vessel evaluation is mildly limited by motion artifact. No intracranial aneurysm is identified. MRA NECK FINDINGS Normal variant 4 vessel aortic arch with the left  vertebral artery arising directly from the arch. Brachiocephalic and subclavian arteries appear widely patent, although the proximal left subclavian artery was incompletely imaged. Common carotid and cervical internal carotid arteries are widely patent bilaterally. Vertebral arteries are patent and codominant with antegrade flow bilaterally. No vertebral artery stenosis is identified. IMPRESSION: 1. Numerous supratentorial and infratentorial brain lesions consistent with demyelinating disease/multiple sclerosis. Enhancing/restricted lesions are consistent with active demyelination. 2. Multiple cervical spinal cord lesions suspected. 3. No evidence of significant intracranial or extracranial arterial stenosis, dissection, or major branch vessel occlusion. Electronically Signed   By: Sebastian Ache M.D.   On: 11/15/2015 21:51   Mr Laqueta Jean JX Contrast  11/15/2015  CLINICAL DATA:  Vertigo. Headache for 1 week. Prior bilateral lower extremity numbness. EXAM: MRI HEAD WITHOUT AND WITH CONTRAST MRA HEAD WITHOUT CONTRAST MRA NECK WITHOUT AND WITH CONTRAST TECHNIQUE: Multiplanar, multiecho pulse sequences of the brain and surrounding structures were obtained without and with intravenous contrast. Angiographic images of the Circle of Willis were obtained using MRA technique without intravenous contrast. Angiographic images of the neck were obtained using MRA technique without and with intravenous contrast. Carotid stenosis measurements (when applicable) are obtained utilizing NASCET criteria, using the distal internal carotid diameter as the denominator. CONTRAST:  20mL MULTIHANCE GADOBENATE DIMEGLUMINE 529 MG/ML IV SOLN The patient experienced nausea during the IV contrast injection which resulted in disrupted contrast timing on the neck MRA. COMPARISON:  Head CT 10/14/2015 FINDINGS: MRI HEAD FINDINGS There are patchy T2 hyperintensities throughout the cerebral white matter bilaterally. There are multiple periventricular  lesions which are oriented perpendicularly to the lateral ventricles. Multiple corpus callosum lesions are present, including an 8 mm lesion in the body on the right which demonstrates restricted diffusion and faint enhancement. Multiple other lesions demonstrate T2 shine through on diffusion imaging. There are also multiple enhancing lesions in both cerebral hemispheres, the largest measuring 1.5 cm in the left frontal lobe. Juxtacortical lesions are present. There is an 8 mm nonenhancing lesion in the right pons, and there is a 7 mm lesion at the pontomedullary junction. Multiple lesions are suspected in the cervical spinal cord. There is no midline shift, intracranial hemorrhage, or extra-axial fluid collection. Ventricles and sulci are within normal limits. Orbits are unremarkable on this nondedicated study. Mild mucosal thickening is noted in the ethmoid air cells. The mastoid air cells are clear. Major intracranial vascular flow voids are preserved. MRA HEAD FINDINGS Mild motion artifact. The intracranial vertebral arteries are widely patent to the vertebrobasilar junction. Left PICA and right AICA are dominant. SCA origins are patent. Basilar artery is patent without stenosis. There is a patent right posterior communicating  artery. PCAs are patent without evidence of major branch occlusion or significant proximal stenosis. The internal carotid arteries are patent from skullbase to carotid termini without stenosis. ACAs and MCAs are patent without evidence of major branch occlusion or significant proximal stenosis. Branch vessel evaluation is mildly limited by motion artifact. No intracranial aneurysm is identified. MRA NECK FINDINGS Normal variant 4 vessel aortic arch with the left vertebral artery arising directly from the arch. Brachiocephalic and subclavian arteries appear widely patent, although the proximal left subclavian artery was incompletely imaged. Common carotid and cervical internal carotid  arteries are widely patent bilaterally. Vertebral arteries are patent and codominant with antegrade flow bilaterally. No vertebral artery stenosis is identified. IMPRESSION: 1. Numerous supratentorial and infratentorial brain lesions consistent with demyelinating disease/multiple sclerosis. Enhancing/restricted lesions are consistent with active demyelination. 2. Multiple cervical spinal cord lesions suspected. 3. No evidence of significant intracranial or extracranial arterial stenosis, dissection, or major branch vessel occlusion. Electronically Signed   By: Sebastian Ache M.D.   On: 11/15/2015 21:51      Assessment/Plan:   30 y.o. female with hx as below presents with a 5 day feeling of oscillopsia and vertigo and has been slowly improving. She states that over the years she has had episodes of sensory disturbances along with stiffness feeling in her extremities that lasted over a week but always resolved.  MRI consistent with MS. 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

## 2015-11-15 NOTE — ED Notes (Signed)
Patient here with ongoing generalized headache x 1 week. Seen at [facility] last week with no relief. Alert and oriented

## 2015-11-15 NOTE — H&P (Signed)
Family Medicine Teaching [facility] Admission History and Physical Service Pager: [phone]  Patient name: Alicia Barker Medical record number: [id number] Date of birth: June 12, 1986 Age: 30 y.o. Gender: female  Primary Care Provider: Kathreen Cosier, MD Consultants: Neuro Code Status: FULL  Chief Complaint: blurred vision, dizziness  Assessment and Plan: Alicia Barker is a 30 y.o. female presenting with blurred vision and dizziness . PMH is significant for depression.  Multiple sclerosis: Newly diagnosed in ED on admission given history and imaging. Likely responsible for blurred vision, dizziness, and other symptoms patient has reported over past 8 years. MRI obtained in ED consistent with MS, with multiple demyelinating lesions.  - Admit to inpatient unit, attending Dr. Jennette Kettle - Neuro following - appreciate recs - 1g solumedrol IV qd x5 days (day 1/5) - MRI C-spine w/ and w/o contrast per neuro recs - neuro to discuss future medications   Headache: Present for past few days after onset of blurred vision.  - Toradol PRN - Continue to monitor   FEN/GI: regular diet, Miralax Prophylaxis: Lovenox  Disposition: admit for high-dose steroids  History of Present Illness:  Alicia Barker is a 30 y.o. female presenting with blurred vision and dizziness.   Patient reports that for the past two weeks she has been experiencing dizziness. This occurs primarily when she stands up or moves around. She has been taking Antivert with no relief. She also reports about a week of blurred and double vision, especially when looking to the side.   She reports a history of intermittent episodes of generalized weakness in both lower extremities for the past roughly 8 years. She reports episodes lasting 1-2 weeks where she was unable to get out of bed. She says during this episodes she feels as if her legs are paralyzed, and has no sensation in her legs in addition to the weakness. This always  resolves spontaneously. She has never been evaluated for this before. Over the past couple months, she has also developed intermittent numbness, tingling, and a pins and needles sensation in both lower extremities. She denies respiratory complaints or chest pain. Does endorse some difficulty swallowing solids a few days ago.   Patient also reports a headache beginning a couple days ago. She reports a history of migraines but says this feels different. She describes the pain as located primarily at her temples bilaterally, and says the pain is mostly sharp. Endorses photophobia and phonophobia. She reports onset of the HA after onset of blurred vision.   Review Of Systems: Per HPI with the following additions: endorses constipation (last BM four days prior to admission).  Otherwise the remainder of the systems were negative.  Patient Active Problem List   Diagnosis Date Noted  . Multiple sclerosis (HCC) 11/15/2015    Past Medical History: Past Medical History  Diagnosis Date  . SVD (spontaneous vaginal delivery)     x 5  . Depression     no meds currently    Past Surgical History: Past Surgical History  Procedure Laterality Date  . Right hand surgery    . Laparoscopic tubal ligation Bilateral 01/06/2013    Procedure: LAPAROSCOPIC TUBAL LIGATION;  Surgeon: Kathreen Cosier, MD;  Location: [facility];  Service: Gynecology;  Laterality: Bilateral;    Social History: Social History  Substance Use Topics  . Smoking status: Current Some Day Smoker -- 0.10 packs/day    Types: Cigarettes  . Smokeless tobacco: Never Used  . Alcohol Use: No   Additional social history:  reports stopping smoking two weeks ago.  Please also refer to relevant sections of EMR.  Family History: Family History  Problem Relation Age of Onset  . Diabetes Mother   . Heart disease Mother   . Diabetes Father     Allergies and Medications: Allergies  Allergen Reactions  . Penicillins Anaphylaxis, Shortness Of  Breath and Swelling    Has patient had a PCN reaction causing immediate rash, facial/tongue/throat swelling, SOB or lightheadedness with hypotension: YES Has patient had a PCN reaction causing severe rash involving mucus membranes or skin necrosis: NO Has patient had a PCN reaction that required hospitalization NO Has patient had a PCN reaction occurring within the last 10 years: NO If all of the above answers are "NO", then may proceed with Cephalosporin use.   No current facility-administered medications on file prior to encounter.   Current Outpatient Prescriptions on File Prior to Encounter  Medication Sig Dispense Refill  . acetaminophen (TYLENOL) 500 MG tablet Take 500 mg by mouth every 6 (six) hours as needed for mild pain.     . meclizine (ANTIVERT) 25 MG tablet Take 1 tablet (25 mg total) by mouth 3 (three) times daily as needed for dizziness. 30 tablet 0  . ondansetron (ZOFRAN) 4 MG tablet Take 1 tablet (4 mg total) by mouth every 6 (six) hours. Prn nausea or vomiting 12 tablet 0  . [DISCONTINUED] fluticasone (FLONASE) 50 MCG/ACT nasal spray Place 1-2 sprays into both nostrils at bedtime. (Patient not taking: Reported on 10/14/2015) 16 g 0  . [DISCONTINUED] ipratropium (ATROVENT) 0.06 % nasal spray Place 2 sprays into both nostrils 4 (four) times daily. (Patient not taking: Reported on 10/14/2015) 15 mL 12    Objective: BP 120/67 mmHg  Pulse 54  Temp(Src) 99.1 F (37.3 C) (Oral)  Resp 17  Wt 200 lb 4.8 oz (90.855 kg)  SpO2 100%  LMP 10/31/2015 Exam: General: sitting up in bed in NAD; mother at bedside Eyes: horizontal and vertical nystagmus with upward gaze present bilaterally; PERRLA ENTM: MMM Neck: supple, no lymphadenopathy Cardiovascular: RRR, no murmurs appreciated Respiratory: CTAB, no wheezes or rhonchi Abdomen: soft, non-tender, non-distended, +BS MSK: 5/5 strength upper and lower extremities bilaterally; no LE edema Skin: no rashes or bruises noted Neuro: CN II-XII  grossly intact; biceps and patellar reflexes 1+ bilaterally; down-going Babinski bialterally; A&Ox3 Psych: appropriate mood and affect  Labs and Imaging: CBC BMET   Recent Labs Lab 11/15/15 1830  WBC 5.9  HGB 12.4  HCT 37.3  PLT 227    Recent Labs Lab 11/15/15 1830  NA 135  K 3.7  CL 102  CO2 21*  BUN 7  CREATININE 0.60  GLUCOSE 82  CALCIUM 9.4     Mr Laqueta Jean Wo Contrast 11/15/2015  CLINICAL DATA:  Vertigo. Headache for 1 week. Prior bilateral lower extremity numbness. EXAM: MRI HEAD WITHOUT AND WITH CONTRAST MRA HEAD WITHOUT CONTRAST MRA NECK WITHOUT AND WITH CONTRAST. IMPRESSION: 1. Numerous supratentorial and infratentorial brain lesions consistent with demyelinating disease/multiple sclerosis. Enhancing/restricted lesions are consistent with active demyelination. 2. Multiple cervical spinal cord lesions suspected. 3. No evidence of significant intracranial or extracranial arterial stenosis, dissection, or major branch vessel occlusion.    Marquette Saa, MD 11/16/2015, 12:22 AM PGY-1, [facility]PT[facility] Intern pager: [phone], text pages welcome  Upper Level Addendum:  I have seen and evaluated this patient along with Dr. Natale Milch and reviewed the above note, making necessary revisions in purple.   Kenslee Achorn S.  Leonides Schanz, MD [facility] Family Medicine Resident, PGY-2

## 2015-11-15 NOTE — ED Provider Notes (Signed)
CSN: [id number]     Arrival date & time 11/15/15  1206 History   First MD Initiated Contact with Patient 11/15/15 1726     Chief Complaint  Patient presents with  . Headache     (Consider location/radiation/quality/duration/timing/severity/associated sxs/prior Treatment) HPI  30 year old female presents with continued dizziness. Was seen at urgent care last week and given Antivert for BPPV. Patient has been having dizziness for about one week in total. Worse whenever she looks down or stands up. Her husband states that she has been walking "funny" like she is drunk. Patient denies any alcohol or drug use. Patient states that about a day or 2 after this started she developed a headache. Headache comes whenever the dizziness is worse. No vomiting but has been nauseated. The headache when he comes feels like a migraine she has had in the past. No fevers. No neck stiffness. Feels like she has tingling in her bilateral lower legs.  Past Medical History  Diagnosis Date  . SVD (spontaneous vaginal delivery)     x 5  . Depression     no meds currently   Past Surgical History  Procedure Laterality Date  . Right hand surgery    . Laparoscopic tubal ligation Bilateral 01/06/2013    Procedure: LAPAROSCOPIC TUBAL LIGATION;  Surgeon: Kathreen Cosier, MD;  Location: [facility];  Service: Gynecology;  Laterality: Bilateral;   Family History  Problem Relation Age of Onset  . Diabetes Mother   . Heart disease Mother   . Diabetes Father    Social History  Substance Use Topics  . Smoking status: Current Some Day Smoker -- 0.10 packs/day    Types: Cigarettes  . Smokeless tobacco: Never Used  . Alcohol Use: No   OB History    Gravida Para Term Preterm AB TAB SAB Ectopic Multiple Living   5 5 4 1  0 0 0 0 0 5     Review of Systems  Eyes: Positive for photophobia. Negative for visual disturbance.  Gastrointestinal: Positive for nausea. Negative for vomiting.  Musculoskeletal: Negative for neck  pain.  Neurological: Positive for dizziness, numbness and headaches. Negative for weakness.  All other systems reviewed and are negative.     Allergies  Penicillins  Home Medications   Prior to Admission medications   Medication Sig Start Date End Date Taking? Authorizing Provider  acetaminophen (TYLENOL) 500 MG tablet Take 500 mg by mouth every 6 (six) hours as needed for mild pain.    Yes Historical Provider, MD  meclizine (ANTIVERT) 25 MG tablet Take 1 tablet (25 mg total) by mouth 3 (three) times daily as needed for dizziness. 11/10/15  Yes Hayden Rasmussen, NP  ondansetron (ZOFRAN) 4 MG tablet Take 1 tablet (4 mg total) by mouth every 6 (six) hours. Prn nausea or vomiting 11/10/15  Yes Hayden Rasmussen, NP   BP 109/69 mmHg  Pulse 56  Temp(Src) 97.9 F (36.6 C) (Oral)  Resp 18  SpO2 100%  LMP 10/31/2015 Physical Exam  Constitutional: She is oriented to person, place, and time. She appears well-developed and well-nourished.  HENT:  Head: Normocephalic and atraumatic.  Right Ear: External ear normal.  Left Ear: External ear normal.  Nose: Nose normal.  Eyes: Pupils are equal, round, and reactive to light. Right eye exhibits no discharge. Left eye exhibits no discharge. Right eye exhibits nystagmus. Left eye exhibits nystagmus.  Cardiovascular: Normal rate, regular rhythm and normal heart sounds.   Pulmonary/Chest: Effort normal and breath sounds normal.  Abdominal:  Soft. There is no tenderness.  Neurological: She is alert and oriented to person, place, and time.  CN 2-12 grossly intact. 5/5 strength in all 4 extremities. Grossly normal sensation. Normal finger to nose  Skin: Skin is warm and dry.  Nursing note and vitals reviewed.   ED Course  Procedures (including critical care time) Labs Review Labs Reviewed  BASIC METABOLIC PANEL - Abnormal; Notable for the following:    CO2 21 (*)    All other components within normal limits  CBC WITH DIFFERENTIAL/PLATELET  POC URINE PREG,  ED    Imaging Review Mr Angiogram Head Wo Contrast  11/15/2015  CLINICAL DATA:  Vertigo. Headache for 1 week. Prior bilateral lower extremity numbness. EXAM: MRI HEAD WITHOUT AND WITH CONTRAST MRA HEAD WITHOUT CONTRAST MRA NECK WITHOUT AND WITH CONTRAST TECHNIQUE: Multiplanar, multiecho pulse sequences of the brain and surrounding structures were obtained without and with intravenous contrast. Angiographic images of the Circle of Willis were obtained using MRA technique without intravenous contrast. Angiographic images of the neck were obtained using MRA technique without and with intravenous contrast. Carotid stenosis measurements (when applicable) are obtained utilizing NASCET criteria, using the distal internal carotid diameter as the denominator. CONTRAST:  20mL MULTIHANCE GADOBENATE DIMEGLUMINE 529 MG/ML IV SOLN The patient experienced nausea during the IV contrast injection which resulted in disrupted contrast timing on the neck MRA. COMPARISON:  Head CT 10/14/2015 FINDINGS: MRI HEAD FINDINGS There are patchy T2 hyperintensities throughout the cerebral white matter bilaterally. There are multiple periventricular lesions which are oriented perpendicularly to the lateral ventricles. Multiple corpus callosum lesions are present, including an 8 mm lesion in the body on the right which demonstrates restricted diffusion and faint enhancement. Multiple other lesions demonstrate T2 shine through on diffusion imaging. There are also multiple enhancing lesions in both cerebral hemispheres, the largest measuring 1.5 cm in the left frontal lobe. Juxtacortical lesions are present. There is an 8 mm nonenhancing lesion in the right pons, and there is a 7 mm lesion at the pontomedullary junction. Multiple lesions are suspected in the cervical spinal cord. There is no midline shift, intracranial hemorrhage, or extra-axial fluid collection. Ventricles and sulci are within normal limits. Orbits are unremarkable on this  nondedicated study. Mild mucosal thickening is noted in the ethmoid air cells. The mastoid air cells are clear. Major intracranial vascular flow voids are preserved. MRA HEAD FINDINGS Mild motion artifact. The intracranial vertebral arteries are widely patent to the vertebrobasilar junction. Left PICA and right AICA are dominant. SCA origins are patent. Basilar artery is patent without stenosis. There is a patent right posterior communicating artery. PCAs are patent without evidence of major branch occlusion or significant proximal stenosis. The internal carotid arteries are patent from skullbase to carotid termini without stenosis. ACAs and MCAs are patent without evidence of major branch occlusion or significant proximal stenosis. Branch vessel evaluation is mildly limited by motion artifact. No intracranial aneurysm is identified. MRA NECK FINDINGS Normal variant 4 vessel aortic arch with the left vertebral artery arising directly from the arch. Brachiocephalic and subclavian arteries appear widely patent, although the proximal left subclavian artery was incompletely imaged. Common carotid and cervical internal carotid arteries are widely patent bilaterally. Vertebral arteries are patent and codominant with antegrade flow bilaterally. No vertebral artery stenosis is identified. IMPRESSION: 1. Numerous supratentorial and infratentorial brain lesions consistent with demyelinating disease/multiple sclerosis. Enhancing/restricted lesions are consistent with active demyelination. 2. Multiple cervical spinal cord lesions suspected. 3. No evidence of significant intracranial or  extracranial arterial stenosis, dissection, or major branch vessel occlusion. Electronically Signed   By: Sebastian Ache M.D.   On: 11/15/2015 21:51   Mr Angiogram Neck W Wo Contrast  11/15/2015  CLINICAL DATA:  Vertigo. Headache for 1 week. Prior bilateral lower extremity numbness. EXAM: MRI HEAD WITHOUT AND WITH CONTRAST MRA HEAD WITHOUT  CONTRAST MRA NECK WITHOUT AND WITH CONTRAST TECHNIQUE: Multiplanar, multiecho pulse sequences of the brain and surrounding structures were obtained without and with intravenous contrast. Angiographic images of the Circle of Willis were obtained using MRA technique without intravenous contrast. Angiographic images of the neck were obtained using MRA technique without and with intravenous contrast. Carotid stenosis measurements (when applicable) are obtained utilizing NASCET criteria, using the distal internal carotid diameter as the denominator. CONTRAST:  32mL MULTIHANCE GADOBENATE DIMEGLUMINE 529 MG/ML IV SOLN The patient experienced nausea during the IV contrast injection which resulted in disrupted contrast timing on the neck MRA. COMPARISON:  Head CT 10/14/2015 FINDINGS: MRI HEAD FINDINGS There are patchy T2 hyperintensities throughout the cerebral white matter bilaterally. There are multiple periventricular lesions which are oriented perpendicularly to the lateral ventricles. Multiple corpus callosum lesions are present, including an 8 mm lesion in the body on the right which demonstrates restricted diffusion and faint enhancement. Multiple other lesions demonstrate T2 shine through on diffusion imaging. There are also multiple enhancing lesions in both cerebral hemispheres, the largest measuring 1.5 cm in the left frontal lobe. Juxtacortical lesions are present. There is an 8 mm nonenhancing lesion in the right pons, and there is a 7 mm lesion at the pontomedullary junction. Multiple lesions are suspected in the cervical spinal cord. There is no midline shift, intracranial hemorrhage, or extra-axial fluid collection. Ventricles and sulci are within normal limits. Orbits are unremarkable on this nondedicated study. Mild mucosal thickening is noted in the ethmoid air cells. The mastoid air cells are clear. Major intracranial vascular flow voids are preserved. MRA HEAD FINDINGS Mild motion artifact. The  intracranial vertebral arteries are widely patent to the vertebrobasilar junction. Left PICA and right AICA are dominant. SCA origins are patent. Basilar artery is patent without stenosis. There is a patent right posterior communicating artery. PCAs are patent without evidence of major branch occlusion or significant proximal stenosis. The internal carotid arteries are patent from skullbase to carotid termini without stenosis. ACAs and MCAs are patent without evidence of major branch occlusion or significant proximal stenosis. Branch vessel evaluation is mildly limited by motion artifact. No intracranial aneurysm is identified. MRA NECK FINDINGS Normal variant 4 vessel aortic arch with the left vertebral artery arising directly from the arch. Brachiocephalic and subclavian arteries appear widely patent, although the proximal left subclavian artery was incompletely imaged. Common carotid and cervical internal carotid arteries are widely patent bilaterally. Vertebral arteries are patent and codominant with antegrade flow bilaterally. No vertebral artery stenosis is identified. IMPRESSION: 1. Numerous supratentorial and infratentorial brain lesions consistent with demyelinating disease/multiple sclerosis. Enhancing/restricted lesions are consistent with active demyelination. 2. Multiple cervical spinal cord lesions suspected. 3. No evidence of significant intracranial or extracranial arterial stenosis, dissection, or major branch vessel occlusion. Electronically Signed   By: Sebastian Ache M.D.   On: 11/15/2015 21:51   Mr Laqueta Jean AX Contrast  11/15/2015  CLINICAL DATA:  Vertigo. Headache for 1 week. Prior bilateral lower extremity numbness. EXAM: MRI HEAD WITHOUT AND WITH CONTRAST MRA HEAD WITHOUT CONTRAST MRA NECK WITHOUT AND WITH CONTRAST TECHNIQUE: Multiplanar, multiecho pulse sequences of the brain and surrounding structures were  obtained without and with intravenous contrast. Angiographic images of the Circle of  Willis were obtained using MRA technique without intravenous contrast. Angiographic images of the neck were obtained using MRA technique without and with intravenous contrast. Carotid stenosis measurements (when applicable) are obtained utilizing NASCET criteria, using the distal internal carotid diameter as the denominator. CONTRAST:  20mL MULTIHANCE GADOBENATE DIMEGLUMINE 529 MG/ML IV SOLN The patient experienced nausea during the IV contrast injection which resulted in disrupted contrast timing on the neck MRA. COMPARISON:  Head CT 10/14/2015 FINDINGS: MRI HEAD FINDINGS There are patchy T2 hyperintensities throughout the cerebral white matter bilaterally. There are multiple periventricular lesions which are oriented perpendicularly to the lateral ventricles. Multiple corpus callosum lesions are present, including an 8 mm lesion in the body on the right which demonstrates restricted diffusion and faint enhancement. Multiple other lesions demonstrate T2 shine through on diffusion imaging. There are also multiple enhancing lesions in both cerebral hemispheres, the largest measuring 1.5 cm in the left frontal lobe. Juxtacortical lesions are present. There is an 8 mm nonenhancing lesion in the right pons, and there is a 7 mm lesion at the pontomedullary junction. Multiple lesions are suspected in the cervical spinal cord. There is no midline shift, intracranial hemorrhage, or extra-axial fluid collection. Ventricles and sulci are within normal limits. Orbits are unremarkable on this nondedicated study. Mild mucosal thickening is noted in the ethmoid air cells. The mastoid air cells are clear. Major intracranial vascular flow voids are preserved. MRA HEAD FINDINGS Mild motion artifact. The intracranial vertebral arteries are widely patent to the vertebrobasilar junction. Left PICA and right AICA are dominant. SCA origins are patent. Basilar artery is patent without stenosis. There is a patent right posterior  communicating artery. PCAs are patent without evidence of major branch occlusion or significant proximal stenosis. The internal carotid arteries are patent from skullbase to carotid termini without stenosis. ACAs and MCAs are patent without evidence of major branch occlusion or significant proximal stenosis. Branch vessel evaluation is mildly limited by motion artifact. No intracranial aneurysm is identified. MRA NECK FINDINGS Normal variant 4 vessel aortic arch with the left vertebral artery arising directly from the arch. Brachiocephalic and subclavian arteries appear widely patent, although the proximal left subclavian artery was incompletely imaged. Common carotid and cervical internal carotid arteries are widely patent bilaterally. Vertebral arteries are patent and codominant with antegrade flow bilaterally. No vertebral artery stenosis is identified. IMPRESSION: 1. Numerous supratentorial and infratentorial brain lesions consistent with demyelinating disease/multiple sclerosis. Enhancing/restricted lesions are consistent with active demyelination. 2. Multiple cervical spinal cord lesions suspected. 3. No evidence of significant intracranial or extracranial arterial stenosis, dissection, or major branch vessel occlusion. Electronically Signed   By: Sebastian Ache M.D.   On: 11/15/2015 21:51   I have personally reviewed and evaluated these images and lab results as part of my medical decision-making.   EKG Interpretation None      MDM   Final diagnoses:  Dizziness  Multiple Sclerosis  Discussed patient presentation with Dr. Lavon Paganini, who recommends MRI but also MRA of head and neck to rule out dissection. Patient's MRI is consistent with multiple demyelinating lesions consistent with MS. discussed again with neurology, Dr. Zigmund Daniel, who will evaluate patient but agrees with admission for high-dose IV Solu-Medrol. Patient given 1 g in the ED. Patient to be admitted to the family practice service as  she does not have a primary care physician.    Pricilla Loveless, MD 11/15/15 [phone]

## 2015-11-15 NOTE — ED Notes (Signed)
Warm blanket given

## 2015-11-15 NOTE — ED Notes (Signed)
Patient brought back to room via wheelchair; patient undressed, in gown, on continuous pulse oximetry and blood pressure cuff

## 2015-11-16 ENCOUNTER — Inpatient Hospital Stay (HOSPITAL_COMMUNITY): Payer: Medicaid Other

## 2015-11-16 DIAGNOSIS — G35 Multiple sclerosis: Principal | ICD-10-CM

## 2015-11-16 DIAGNOSIS — R42 Dizziness and giddiness: Secondary | ICD-10-CM

## 2015-11-16 DIAGNOSIS — G379 Demyelinating disease of central nervous system, unspecified: Secondary | ICD-10-CM

## 2015-11-16 LAB — GLUCOSE, CAPILLARY: Glucose-Capillary: 184 mg/dL — ABNORMAL HIGH (ref 65–99)

## 2015-11-16 MED ORDER — SODIUM CHLORIDE 0.9 % IV SOLN
250.0000 mg | Freq: Four times a day (QID) | INTRAVENOUS | Status: DC
  Administered 2015-11-16 – 2015-11-20 (×17): 250 mg via INTRAVENOUS
  Filled 2015-11-16 (×20): qty 2

## 2015-11-16 MED ORDER — KETOROLAC TROMETHAMINE 10 MG PO TABS
10.0000 mg | ORAL_TABLET | Freq: Four times a day (QID) | ORAL | Status: DC | PRN
  Administered 2015-11-16 – 2015-11-18 (×5): 10 mg via ORAL
  Filled 2015-11-16 (×9): qty 1

## 2015-11-16 MED ORDER — ONDANSETRON 4 MG PO TBDP
4.0000 mg | ORAL_TABLET | Freq: Three times a day (TID) | ORAL | Status: DC | PRN
  Administered 2015-11-16 – 2015-11-20 (×3): 4 mg via ORAL
  Filled 2015-11-16 (×3): qty 1

## 2015-11-16 MED ORDER — PANTOPRAZOLE SODIUM 40 MG PO TBEC
40.0000 mg | DELAYED_RELEASE_TABLET | Freq: Every day | ORAL | Status: DC
  Administered 2015-11-16 – 2015-11-20 (×5): 40 mg via ORAL
  Filled 2015-11-16 (×5): qty 1

## 2015-11-16 NOTE — Progress Notes (Signed)
Family Medicine Teaching Service Daily Progress Note Intern Pager: [phone]  Patient name: Alicia Barker Medical record number: [id number] Date of birth: [birth year] Age: 30 y.o. Gender: female  Primary Care Provider: Kathreen Cosier, MD Consultants: neuro Code Status: FULL  Pt Overview and Major Events to Date:  4/5 - admitted to FPTS; solumedrol started  Assessment and Plan: Alicia Barker is a 30 y.o. female presenting with blurred vision and dizziness . PMH is significant for depression.  Multiple sclerosis: Newly diagnosed in ED on admission given history and imaging. Likely responsible for blurred vision, dizziness, and other symptoms patient has reported over past 8 years. MRI obtained in ED consistent with MS, with multiple demyelinating lesions. Blurred vision and dizziness persist this AM.  - Neuro following - appreciate recs - 1g solumedrol IV qd x5 days (day 2/5) - MRI C-spine w/ and w/o contrast per neuro recs  Headache: Present for past few days after onset of blurred vision. Unchanged this AM - Toradol PRN - Continue to monitor   FEN/GI: regular diet, Miralax Prophylaxis: Lovenox  Disposition: home after completion of steroids  Subjective:  Patient reports continued blurry vision, dizziness, and HA today. HA is still located temporally bilaterally. She does not feel that her symptoms have improved at all.   Objective: Temp:  [97.9 F (36.6 C)-99.1 F (37.3 C)] 98 F (36.7 C) (04/06 1018) Pulse Rate:  [48-74] 67 (04/06 1018) Resp:  [17-19] 18 (04/06 1018) BP: (106-134)/(58-112) 106/58 mmHg (04/06 1018) SpO2:  [95 %-100 %] 95 % (04/06 1018) Weight:  [200 lb (90.719 kg)-200 lb 4.8 oz (90.855 kg)] 200 lb (90.719 kg) (04/06 0850) Physical Exam: General: sitting up in bed in NAD HEENT: PERRLA, vertical and horizontal nystagmus present bilaterally Cardiovascular: RRR, no murmurs appreciated Respiratory: CTAB, no wheezes or rhonchi Abdomen: soft,  non-tender, non-distended, +BS Extremities: 5/5 strength upper and lower extremities bilaterally; no LE edema  Laboratory:  Recent Labs Lab 11/15/15 1830  WBC 5.9  HGB 12.4  HCT 37.3  PLT 227    Recent Labs Lab 11/15/15 1830  NA 135  K 3.7  CL 102  CO2 21*  BUN 7  CREATININE 0.60  CALCIUM 9.4  GLUCOSE 82    Imaging/Diagnostic Tests: Mr Lodema Pilot Contrast 11/15/2015 CLINICAL DATA: Vertigo. Headache for 1 week. Prior bilateral lower extremity numbness. EXAM: MRI HEAD WITHOUT AND WITH CONTRAST MRA HEAD WITHOUT CONTRAST MRA NECK WITHOUT AND WITH CONTRAST. IMPRESSION: 1. Numerous supratentorial and infratentorial brain lesions consistent with demyelinating disease/multiple sclerosis. Enhancing/restricted lesions are consistent with active demyelination. 2. Multiple cervical spinal cord lesions suspected. 3. No evidence of significant intracranial or extracranial arterial stenosis, dissection, or major branch vessel occlusion.   Marquette Saa, MD 11/16/2015, 11:13 AM PGY-1, [facility] Family Medicine FPTS Intern pager: [phone], text pages welcome

## 2015-11-16 NOTE — Progress Notes (Signed)
PT CAME DOWN FOR MRI AND DID NOT WANT TO DO THE CONTRAST PART AT THIS TIME, PT DID GET THE NON-CONTRAST PART OF BOTH MRI'S THAT WERE ORDERED DONE, PT STATED THAT SHE WANTS TO RETURN TO FINISH THE CONTRAST PART ON 11/17/15

## 2015-11-16 NOTE — Progress Notes (Signed)
Pt arrived on unit 2300hrs, A&O, no obvious distress, c/o headache.admission notified, pt oriented to room and equipment.

## 2015-11-16 NOTE — Care Management Note (Signed)
Case Management Note  Patient Details  Name: Alicia Barker MRN: [id number] Date of Birth: [birth year]  Subjective/Objective:                    Action/Plan: Patient was admitted with dizziness, blurred vision. Lives at home with spouse. Will follow for discharge needs pending PT/OT evals and physician orders.  Expected Discharge Date:                  Expected Discharge Plan:     In-House Referral:     Discharge planning Services     Post Acute Care Choice:    Choice offered to:     DME Arranged:    DME Agency:     HH Arranged:    HH Agency:     Status of Service:  In process, will continue to follow  Medicare Important Message Given:    Date Medicare IM Given:    Medicare IM give by:    Date Additional Medicare IM Given:    Additional Medicare Important Message give by:     If discussed at Long Length of Stay Meetings, dates discussed:    Additional Comments:  Anda Kraft, RN 11/16/2015, 10:30 AM [phone]

## 2015-11-16 NOTE — Progress Notes (Signed)
Interval History:                                                                                                                      Alicia Barker is an 30 y.o. female patient with  with a new onset of diplopia and oscillopsia. She does have a right gaze nystagmus.  MRI of the brain showed several demyelinating lesions, some of which are enhancing on the current MRI study several of them are not enhancing lesions. Based on the topography of these lesions this is most likely secondary to multiple sclerosis. She started on high-dose IV Solu-Medrol, no new neurological symptoms today.   Of note, given the MRI showing characteristic lesions in the topography consistent with multiple sclerosis, lumbar puncture has been deferred as it may not provide additional diagnostic value in this case.  Past Medical History: Past Medical History  Diagnosis Date  . SVD (spontaneous vaginal delivery)     x 5  . Depression     no meds currently    Past Surgical History  Procedure Laterality Date  . Right hand surgery    . Laparoscopic tubal ligation Bilateral 01/06/2013    Procedure: LAPAROSCOPIC TUBAL LIGATION;  Surgeon: Kathreen Cosier, MD;  Location: [facility];  Service: Gynecology;  Laterality: Bilateral;    Family History: Family History  Problem Relation Age of Onset  . Diabetes Mother   . Heart disease Mother   . Diabetes Father     Social History:   reports that she has been smoking Cigarettes.  She has been smoking about 0.10 packs per day. She has never used smokeless tobacco. She reports that she does not drink alcohol or use illicit drugs.  Allergies:  Allergies  Allergen Reactions  . Penicillins Anaphylaxis, Shortness Of Breath and Swelling    Has patient had a PCN reaction causing immediate rash, facial/tongue/throat swelling, SOB or lightheadedness with hypotension: YES Has patient had a PCN reaction causing severe rash involving mucus membranes or skin necrosis: NO Has patient  had a PCN reaction that required hospitalization NO Has patient had a PCN reaction occurring within the last 10 years: NO If all of the above answers are "NO", then may proceed with Cephalosporin use.     Medications:                                                                                                                         Current facility-administered medications:  .  enoxaparin (LOVENOX) injection 40 mg, 40 mg, Subcutaneous, Daily, Marquette Saa, MD, 40 mg at 11/16/15 1006 .  methylPREDNISolone sodium succinate (SOLU-MEDROL) 250 mg in sodium chloride 0.9 % 50 mL IVPB, 250 mg, Intravenous, Q6H, Evaleen Sant Daniel Nones, MD, 250 mg at 11/16/15 1507 .  ondansetron (ZOFRAN-ODT) disintegrating tablet 4 mg, 4 mg, Oral, Q8H PRN, Marquette Saa, MD, 4 mg at 11/16/15 0036 .  pantoprazole (PROTONIX) EC tablet 40 mg, 40 mg, Oral, Daily, Rhone Ozaki Daniel Nones, MD, 40 mg at 11/16/15 1006 .  polyethylene glycol (MIRALAX / GLYCOLAX) packet 17 g, 17 g, Oral, Daily PRN, Marquette Saa, MD, 17 g at 11/15/15 2331   Neurologic Examination:                                                                                                     Today's Vitals   11/16/15 0614 11/16/15 0724 11/16/15 1018 11/16/15 1810  BP: 116/61  106/58 126/68  Pulse: 57  67 63  Temp: 98.3 F (36.8 C)  98 F (36.7 C) 98 F (36.7 C)  TempSrc: Oral  Oral Oral  Resp: 18  18 18   Height:      Weight:      SpO2: 99%  95% 100%  PainSc:  0-No pain     Alert and oriented 4, fluent speech, cranial examination showed right gaze nystagmus. Symmetric motor strength. Patchy sensory loss in lower extremities.    Lab Results: Basic Metabolic Panel:  Recent Labs Lab 11/15/15 1830  NA 135  K 3.7  CL 102  CO2 21*  GLUCOSE 82  BUN 7  CREATININE 0.60  CALCIUM 9.4    Liver Function Tests: No results for input(s): AST, ALT, ALKPHOS, BILITOT, PROT, ALBUMIN in the last 168  hours. No results for input(s): LIPASE, AMYLASE in the last 168 hours. No results for input(s): AMMONIA in the last 168 hours.  CBC:  Recent Labs Lab 11/15/15 1830  WBC 5.9  NEUTROABS 3.4  HGB 12.4  HCT 37.3  MCV 86.5  PLT 227    Cardiac Enzymes: No results for input(s): CKTOTAL, CKMB, CKMBINDEX, TROPONINI in the last 168 hours.  Lipid Panel: No results for input(s): CHOL, TRIG, HDL, CHOLHDL, VLDL, LDLCALC in the last 168 hours.  CBG: No results for input(s): GLUCAP in the last 168 hours.  Microbiology: Results for orders placed or performed during the hospital encounter of 01/01/13  Surgical pcr screen     Status: None   Collection Time: 01/01/13 10:51 AM  Result Value Ref Range Status   MRSA, PCR NEGATIVE NEGATIVE Final   Staphylococcus aureus NEGATIVE NEGATIVE Final    Comment:        The Xpert SA Assay (FDA approved for NASAL specimens in patients over 23 years of age), is one component of a comprehensive surveillance program.  Test performance has been validated by [employer] for patients greater than or equal to 68 year old. It is not intended to diagnose infection nor to guide or monitor treatment.    Imaging: Mr Angiogram Head Wo  Contrast  11/15/2015  CLINICAL DATA:  Vertigo. Headache for 1 week. Prior bilateral lower extremity numbness. EXAM: MRI HEAD WITHOUT AND WITH CONTRAST MRA HEAD WITHOUT CONTRAST MRA NECK WITHOUT AND WITH CONTRAST TECHNIQUE: Multiplanar, multiecho pulse sequences of the brain and surrounding structures were obtained without and with intravenous contrast. Angiographic images of the Circle of Willis were obtained using MRA technique without intravenous contrast. Angiographic images of the neck were obtained using MRA technique without and with intravenous contrast. Carotid stenosis measurements (when applicable) are obtained utilizing NASCET criteria, using the distal internal carotid diameter as the denominator. CONTRAST:  20mL  MULTIHANCE GADOBENATE DIMEGLUMINE 529 MG/ML IV SOLN The patient experienced nausea during the IV contrast injection which resulted in disrupted contrast timing on the neck MRA. COMPARISON:  Head CT 10/14/2015 FINDINGS: MRI HEAD FINDINGS There are patchy T2 hyperintensities throughout the cerebral white matter bilaterally. There are multiple periventricular lesions which are oriented perpendicularly to the lateral ventricles. Multiple corpus callosum lesions are present, including an 8 mm lesion in the body on the right which demonstrates restricted diffusion and faint enhancement. Multiple other lesions demonstrate T2 shine through on diffusion imaging. There are also multiple enhancing lesions in both cerebral hemispheres, the largest measuring 1.5 cm in the left frontal lobe. Juxtacortical lesions are present. There is an 8 mm nonenhancing lesion in the right pons, and there is a 7 mm lesion at the pontomedullary junction. Multiple lesions are suspected in the cervical spinal cord. There is no midline shift, intracranial hemorrhage, or extra-axial fluid collection. Ventricles and sulci are within normal limits. Orbits are unremarkable on this nondedicated study. Mild mucosal thickening is noted in the ethmoid air cells. The mastoid air cells are clear. Major intracranial vascular flow voids are preserved. MRA HEAD FINDINGS Mild motion artifact. The intracranial vertebral arteries are widely patent to the vertebrobasilar junction. Left PICA and right AICA are dominant. SCA origins are patent. Basilar artery is patent without stenosis. There is a patent right posterior communicating artery. PCAs are patent without evidence of major branch occlusion or significant proximal stenosis. The internal carotid arteries are patent from skullbase to carotid termini without stenosis. ACAs and MCAs are patent without evidence of major branch occlusion or significant proximal stenosis. Branch vessel evaluation is mildly limited  by motion artifact. No intracranial aneurysm is identified. MRA NECK FINDINGS Normal variant 4 vessel aortic arch with the left vertebral artery arising directly from the arch. Brachiocephalic and subclavian arteries appear widely patent, although the proximal left subclavian artery was incompletely imaged. Common carotid and cervical internal carotid arteries are widely patent bilaterally. Vertebral arteries are patent and codominant with antegrade flow bilaterally. No vertebral artery stenosis is identified. IMPRESSION: 1. Numerous supratentorial and infratentorial brain lesions consistent with demyelinating disease/multiple sclerosis. Enhancing/restricted lesions are consistent with active demyelination. 2. Multiple cervical spinal cord lesions suspected. 3. No evidence of significant intracranial or extracranial arterial stenosis, dissection, or major branch vessel occlusion. Electronically Signed   By: Sebastian Ache M.D.   On: 11/15/2015 21:51   Mr Angiogram Neck W Wo Contrast  11/15/2015  CLINICAL DATA:  Vertigo. Headache for 1 week. Prior bilateral lower extremity numbness. EXAM: MRI HEAD WITHOUT AND WITH CONTRAST MRA HEAD WITHOUT CONTRAST MRA NECK WITHOUT AND WITH CONTRAST TECHNIQUE: Multiplanar, multiecho pulse sequences of the brain and surrounding structures were obtained without and with intravenous contrast. Angiographic images of the Circle of Willis were obtained using MRA technique without intravenous contrast. Angiographic images of the neck were obtained using  MRA technique without and with intravenous contrast. Carotid stenosis measurements (when applicable) are obtained utilizing NASCET criteria, using the distal internal carotid diameter as the denominator. CONTRAST:  20mL MULTIHANCE GADOBENATE DIMEGLUMINE 529 MG/ML IV SOLN The patient experienced nausea during the IV contrast injection which resulted in disrupted contrast timing on the neck MRA. COMPARISON:  Head CT 10/14/2015 FINDINGS: MRI  HEAD FINDINGS There are patchy T2 hyperintensities throughout the cerebral white matter bilaterally. There are multiple periventricular lesions which are oriented perpendicularly to the lateral ventricles. Multiple corpus callosum lesions are present, including an 8 mm lesion in the body on the right which demonstrates restricted diffusion and faint enhancement. Multiple other lesions demonstrate T2 shine through on diffusion imaging. There are also multiple enhancing lesions in both cerebral hemispheres, the largest measuring 1.5 cm in the left frontal lobe. Juxtacortical lesions are present. There is an 8 mm nonenhancing lesion in the right pons, and there is a 7 mm lesion at the pontomedullary junction. Multiple lesions are suspected in the cervical spinal cord. There is no midline shift, intracranial hemorrhage, or extra-axial fluid collection. Ventricles and sulci are within normal limits. Orbits are unremarkable on this nondedicated study. Mild mucosal thickening is noted in the ethmoid air cells. The mastoid air cells are clear. Major intracranial vascular flow voids are preserved. MRA HEAD FINDINGS Mild motion artifact. The intracranial vertebral arteries are widely patent to the vertebrobasilar junction. Left PICA and right AICA are dominant. SCA origins are patent. Basilar artery is patent without stenosis. There is a patent right posterior communicating artery. PCAs are patent without evidence of major branch occlusion or significant proximal stenosis. The internal carotid arteries are patent from skullbase to carotid termini without stenosis. ACAs and MCAs are patent without evidence of major branch occlusion or significant proximal stenosis. Branch vessel evaluation is mildly limited by motion artifact. No intracranial aneurysm is identified. MRA NECK FINDINGS Normal variant 4 vessel aortic arch with the left vertebral artery arising directly from the arch. Brachiocephalic and subclavian arteries appear  widely patent, although the proximal left subclavian artery was incompletely imaged. Common carotid and cervical internal carotid arteries are widely patent bilaterally. Vertebral arteries are patent and codominant with antegrade flow bilaterally. No vertebral artery stenosis is identified. IMPRESSION: 1. Numerous supratentorial and infratentorial brain lesions consistent with demyelinating disease/multiple sclerosis. Enhancing/restricted lesions are consistent with active demyelination. 2. Multiple cervical spinal cord lesions suspected. 3. No evidence of significant intracranial or extracranial arterial stenosis, dissection, or major branch vessel occlusion. Electronically Signed   By: Sebastian Ache M.D.   On: 11/15/2015 21:51   Mr Laqueta Jean ZO Contrast  11/15/2015  CLINICAL DATA:  Vertigo. Headache for 1 week. Prior bilateral lower extremity numbness. EXAM: MRI HEAD WITHOUT AND WITH CONTRAST MRA HEAD WITHOUT CONTRAST MRA NECK WITHOUT AND WITH CONTRAST TECHNIQUE: Multiplanar, multiecho pulse sequences of the brain and surrounding structures were obtained without and with intravenous contrast. Angiographic images of the Circle of Willis were obtained using MRA technique without intravenous contrast. Angiographic images of the neck were obtained using MRA technique without and with intravenous contrast. Carotid stenosis measurements (when applicable) are obtained utilizing NASCET criteria, using the distal internal carotid diameter as the denominator. CONTRAST:  20mL MULTIHANCE GADOBENATE DIMEGLUMINE 529 MG/ML IV SOLN The patient experienced nausea during the IV contrast injection which resulted in disrupted contrast timing on the neck MRA. COMPARISON:  Head CT 10/14/2015 FINDINGS: MRI HEAD FINDINGS There are patchy T2 hyperintensities throughout the cerebral white matter bilaterally. There  are multiple periventricular lesions which are oriented perpendicularly to the lateral ventricles. Multiple corpus callosum  lesions are present, including an 8 mm lesion in the body on the right which demonstrates restricted diffusion and faint enhancement. Multiple other lesions demonstrate T2 shine through on diffusion imaging. There are also multiple enhancing lesions in both cerebral hemispheres, the largest measuring 1.5 cm in the left frontal lobe. Juxtacortical lesions are present. There is an 8 mm nonenhancing lesion in the right pons, and there is a 7 mm lesion at the pontomedullary junction. Multiple lesions are suspected in the cervical spinal cord. There is no midline shift, intracranial hemorrhage, or extra-axial fluid collection. Ventricles and sulci are within normal limits. Orbits are unremarkable on this nondedicated study. Mild mucosal thickening is noted in the ethmoid air cells. The mastoid air cells are clear. Major intracranial vascular flow voids are preserved. MRA HEAD FINDINGS Mild motion artifact. The intracranial vertebral arteries are widely patent to the vertebrobasilar junction. Left PICA and right AICA are dominant. SCA origins are patent. Basilar artery is patent without stenosis. There is a patent right posterior communicating artery. PCAs are patent without evidence of major branch occlusion or significant proximal stenosis. The internal carotid arteries are patent from skullbase to carotid termini without stenosis. ACAs and MCAs are patent without evidence of major branch occlusion or significant proximal stenosis. Branch vessel evaluation is mildly limited by motion artifact. No intracranial aneurysm is identified. MRA NECK FINDINGS Normal variant 4 vessel aortic arch with the left vertebral artery arising directly from the arch. Brachiocephalic and subclavian arteries appear widely patent, although the proximal left subclavian artery was incompletely imaged. Common carotid and cervical internal carotid arteries are widely patent bilaterally. Vertebral arteries are patent and codominant with antegrade  flow bilaterally. No vertebral artery stenosis is identified. IMPRESSION: 1. Numerous supratentorial and infratentorial brain lesions consistent with demyelinating disease/multiple sclerosis. Enhancing/restricted lesions are consistent with active demyelination. 2. Multiple cervical spinal cord lesions suspected. 3. No evidence of significant intracranial or extracranial arterial stenosis, dissection, or major branch vessel occlusion. Electronically Signed   By: Sebastian Ache M.D.   On: 11/15/2015 21:51    Assessment and plan:   Alicia Barker is an 30 y.o. female patient with a new diagnosis of multiple strokes based on abnormal brain MRI as described, tolerating IV Solu-Medrol. Changed the dose to 250 mg every 6 hours, with the plan for a total of 5 days of IV Solu-Medrol  Therapy. If case manager Arrange for home infusion, patient can be discharged home to complete her remaining days of therapy at home.  Had extensive discussion of the patient's regard to multiple cirrhosis diagnosis, and the need for follow-up with outpatient neurologist to start a disease modifying therapy for MS. Dr. Epimenio Foot at [facility] neurology is an MS specialist in the [place] area. Advised to establish care with him. Given the severity of the intracranial disease noted, she may benefit from Tysabri has a disease modifying therapy. Will defer this to the outpatient neurologist following with her. Continue physical therapy and occupational therapy.  We'll follow-up.

## 2015-11-17 ENCOUNTER — Inpatient Hospital Stay (HOSPITAL_COMMUNITY): Payer: Medicaid Other

## 2015-11-17 LAB — GLUCOSE, CAPILLARY
GLUCOSE-CAPILLARY: 127 mg/dL — AB (ref 65–99)
Glucose-Capillary: 132 mg/dL — ABNORMAL HIGH (ref 65–99)
Glucose-Capillary: 196 mg/dL — ABNORMAL HIGH (ref 65–99)
Glucose-Capillary: 150 mg/dL — ABNORMAL HIGH (ref 65–99)

## 2015-11-17 MED ORDER — DIPHENHYDRAMINE HCL 25 MG PO CAPS
25.0000 mg | ORAL_CAPSULE | Freq: Four times a day (QID) | ORAL | Status: DC | PRN
  Administered 2015-11-17: 25 mg via ORAL
  Filled 2015-11-17: qty 1

## 2015-11-17 MED ORDER — SENNA 8.6 MG PO TABS
1.0000 | ORAL_TABLET | Freq: Every day | ORAL | Status: DC
  Administered 2015-11-17 – 2015-11-20 (×4): 8.6 mg via ORAL
  Filled 2015-11-17 (×4): qty 1

## 2015-11-17 MED ORDER — GADOBENATE DIMEGLUMINE 529 MG/ML IV SOLN
20.0000 mL | Freq: Once | INTRAVENOUS | Status: AC
Start: 2015-11-17 — End: 2015-11-17
  Administered 2015-11-17: 20 mL via INTRAVENOUS

## 2015-11-17 MED ORDER — BISACODYL 10 MG RE SUPP
10.0000 mg | Freq: Once | RECTAL | Status: AC
  Administered 2015-11-17: 10 mg via RECTAL
  Filled 2015-11-17: qty 1

## 2015-11-17 NOTE — Progress Notes (Signed)
Interval History:                                                                                                                      Alicia Barker is an 30 y.o. female patient with  newly diagnosed multiple sclerosis as described in the previous notes. She started having steroids well. The diplopia  symptoms are slightly improved.  she is working with physical therapy.  No other new neurological symptoms.   Past Medical History: Past Medical History  Diagnosis Date  . SVD (spontaneous vaginal delivery)     x 5  . Depression     no meds currently    Past Surgical History  Procedure Laterality Date  . Right hand surgery    . Laparoscopic tubal ligation Bilateral 01/06/2013    Procedure: LAPAROSCOPIC TUBAL LIGATION;  Surgeon: Kathreen Cosier, MD;  Location: [facility];  Service: Gynecology;  Laterality: Bilateral;    Family History: Family History  Problem Relation Age of Onset  . Diabetes Mother   . Heart disease Mother   . Diabetes Father     Social History:   reports that she has been smoking Cigarettes.  She has been smoking about 0.10 packs per day. She has never used smokeless tobacco. She reports that she does not drink alcohol or use illicit drugs.  Allergies:  Allergies  Allergen Reactions  . Penicillins Anaphylaxis, Shortness Of Breath and Swelling    Has patient had a PCN reaction causing immediate rash, facial/tongue/throat swelling, SOB or lightheadedness with hypotension: YES Has patient had a PCN reaction causing severe rash involving mucus membranes or skin necrosis: NO Has patient had a PCN reaction that required hospitalization NO Has patient had a PCN reaction occurring within the last 10 years: NO If all of the above answers are "NO", then may proceed with Cephalosporin use.     Medications:                                                                                                                         Current facility-administered  medications:  .  bisacodyl (DULCOLAX) suppository 10 mg, 10 mg, Rectal, Once, Ardith Dark, MD .  diphenhydrAMINE (BENADRYL) capsule 25 mg, 25 mg, Oral, Q6H PRN, Marquette Saa, MD, 25 mg at 11/17/15 1158 .  enoxaparin (LOVENOX) injection 40 mg, 40 mg, Subcutaneous, Daily, Marquette Saa, MD, 40 mg at 11/17/15 0841 .  ketorolac (TORADOL) tablet 10 mg, 10 mg,  Oral, Q6H PRN, Erasmo Downer, MD, 10 mg at 11/17/15 1100 .  methylPREDNISolone sodium succinate (SOLU-MEDROL) 250 mg in sodium chloride 0.9 % 50 mL IVPB, 250 mg, Intravenous, Q6H, Joal Eakle Daniel Nones, MD, 250 mg at 11/17/15 2013 .  ondansetron (ZOFRAN-ODT) disintegrating tablet 4 mg, 4 mg, Oral, Q8H PRN, Marquette Saa, MD, 4 mg at 11/16/15 0036 .  pantoprazole (PROTONIX) EC tablet 40 mg, 40 mg, Oral, Daily, Koden Hunzeker Daniel Nones, MD, 40 mg at 11/17/15 [phone] .  polyethylene glycol (MIRALAX / GLYCOLAX) packet 17 g, 17 g, Oral, Daily PRN, Marquette Saa, MD, 17 g at 11/17/15 1657 .  senna (SENOKOT) tablet 8.6 mg, 1 tablet, Oral, Daily, Marquette Saa, MD, 8.6 mg at 11/17/15 0928   Neurologic Examination:                                                                                                     Today's Vitals   11/17/15 1009 11/17/15 1100 11/17/15 1200 11/17/15 1652  BP: 122/61   124/43  Pulse: 63   59  Temp:    98.7 F (37.1 C)  TempSrc: Oral   Oral  Resp: 16   16  Height:      Weight:      SpO2: 99%   99%  PainSc:  6  0-No pain     Evaluation of higher integrative functions including: Level of alertness: Alert,  Oriented to time, place and person Speech: fluent, no evidence of dysarthria or aphasia noted.  Test the following cranial nerves: 2-12 grossly intact. The right gaze nystagmus have significant improved with some residual transient 2-4 beat nystagmus with end gaze.  Motor examination: full 5/5 motor strength in all 4 extremities    Lab  Results: Basic Metabolic Panel:  Recent Labs Lab 11/15/15 1830  NA 135  K 3.7  CL 102  CO2 21*  GLUCOSE 82  BUN 7  CREATININE 0.60  CALCIUM 9.4    Liver Function Tests: No results for input(s): AST, ALT, ALKPHOS, BILITOT, PROT, ALBUMIN in the last 168 hours. No results for input(s): LIPASE, AMYLASE in the last 168 hours. No results for input(s): AMMONIA in the last 168 hours.  CBC:  Recent Labs Lab 11/15/15 1830  WBC 5.9  NEUTROABS 3.4  HGB 12.4  HCT 37.3  MCV 86.5  PLT 227    Cardiac Enzymes: No results for input(s): CKTOTAL, CKMB, CKMBINDEX, TROPONINI in the last 168 hours.  Lipid Panel: No results for input(s): CHOL, TRIG, HDL, CHOLHDL, VLDL, LDLCALC in the last 168 hours.  CBG:  Recent Labs Lab 11/16/15 2131 11/17/15 0628 11/17/15 1224  GLUCAP 184* 127* 132*    Microbiology: Results for orders placed or performed during the hospital encounter of 01/01/13  Surgical pcr screen     Status: None   Collection Time: 01/01/13 10:51 AM  Result Value Ref Range Status   MRSA, PCR NEGATIVE NEGATIVE Final   Staphylococcus aureus NEGATIVE NEGATIVE Final    Comment:        The Xpert SA Assay (FDA  approved for NASAL specimens in patients over 34 years of age), is one component of a comprehensive surveillance program.  Test performance has been validated by [employer] for patients greater than or equal to 15 year old. It is not intended to diagnose infection nor to guide or monitor treatment.    Imaging: Mr Angiogram Head Wo Contrast  11/15/2015  CLINICAL DATA:  Vertigo. Headache for 1 week. Prior bilateral lower extremity numbness. EXAM: MRI HEAD WITHOUT AND WITH CONTRAST MRA HEAD WITHOUT CONTRAST MRA NECK WITHOUT AND WITH CONTRAST TECHNIQUE: Multiplanar, multiecho pulse sequences of the brain and surrounding structures were obtained without and with intravenous contrast. Angiographic images of the Circle of Willis were obtained using MRA technique  without intravenous contrast. Angiographic images of the neck were obtained using MRA technique without and with intravenous contrast. Carotid stenosis measurements (when applicable) are obtained utilizing NASCET criteria, using the distal internal carotid diameter as the denominator. CONTRAST:  20mL MULTIHANCE GADOBENATE DIMEGLUMINE 529 MG/ML IV SOLN The patient experienced nausea during the IV contrast injection which resulted in disrupted contrast timing on the neck MRA. COMPARISON:  Head CT 10/14/2015 FINDINGS: MRI HEAD FINDINGS There are patchy T2 hyperintensities throughout the cerebral white matter bilaterally. There are multiple periventricular lesions which are oriented perpendicularly to the lateral ventricles. Multiple corpus callosum lesions are present, including an 8 mm lesion in the body on the right which demonstrates restricted diffusion and faint enhancement. Multiple other lesions demonstrate T2 shine through on diffusion imaging. There are also multiple enhancing lesions in both cerebral hemispheres, the largest measuring 1.5 cm in the left frontal lobe. Juxtacortical lesions are present. There is an 8 mm nonenhancing lesion in the right pons, and there is a 7 mm lesion at the pontomedullary junction. Multiple lesions are suspected in the cervical spinal cord. There is no midline shift, intracranial hemorrhage, or extra-axial fluid collection. Ventricles and sulci are within normal limits. Orbits are unremarkable on this nondedicated study. Mild mucosal thickening is noted in the ethmoid air cells. The mastoid air cells are clear. Major intracranial vascular flow voids are preserved. MRA HEAD FINDINGS Mild motion artifact. The intracranial vertebral arteries are widely patent to the vertebrobasilar junction. Left PICA and right AICA are dominant. SCA origins are patent. Basilar artery is patent without stenosis. There is a patent right posterior communicating artery. PCAs are patent without  evidence of major branch occlusion or significant proximal stenosis. The internal carotid arteries are patent from skullbase to carotid termini without stenosis. ACAs and MCAs are patent without evidence of major branch occlusion or significant proximal stenosis. Branch vessel evaluation is mildly limited by motion artifact. No intracranial aneurysm is identified. MRA NECK FINDINGS Normal variant 4 vessel aortic arch with the left vertebral artery arising directly from the arch. Brachiocephalic and subclavian arteries appear widely patent, although the proximal left subclavian artery was incompletely imaged. Common carotid and cervical internal carotid arteries are widely patent bilaterally. Vertebral arteries are patent and codominant with antegrade flow bilaterally. No vertebral artery stenosis is identified. IMPRESSION: 1. Numerous supratentorial and infratentorial brain lesions consistent with demyelinating disease/multiple sclerosis. Enhancing/restricted lesions are consistent with active demyelination. 2. Multiple cervical spinal cord lesions suspected. 3. No evidence of significant intracranial or extracranial arterial stenosis, dissection, or major branch vessel occlusion. Electronically Signed   By: Sebastian Ache M.D.   On: 11/15/2015 21:51   Mr Angiogram Neck W Wo Contrast  11/15/2015  CLINICAL DATA:  Vertigo. Headache for 1 week. Prior bilateral lower extremity  numbness. EXAM: MRI HEAD WITHOUT AND WITH CONTRAST MRA HEAD WITHOUT CONTRAST MRA NECK WITHOUT AND WITH CONTRAST TECHNIQUE: Multiplanar, multiecho pulse sequences of the brain and surrounding structures were obtained without and with intravenous contrast. Angiographic images of the Circle of Willis were obtained using MRA technique without intravenous contrast. Angiographic images of the neck were obtained using MRA technique without and with intravenous contrast. Carotid stenosis measurements (when applicable) are obtained utilizing NASCET  criteria, using the distal internal carotid diameter as the denominator. CONTRAST:  20mL MULTIHANCE GADOBENATE DIMEGLUMINE 529 MG/ML IV SOLN The patient experienced nausea during the IV contrast injection which resulted in disrupted contrast timing on the neck MRA. COMPARISON:  Head CT 10/14/2015 FINDINGS: MRI HEAD FINDINGS There are patchy T2 hyperintensities throughout the cerebral white matter bilaterally. There are multiple periventricular lesions which are oriented perpendicularly to the lateral ventricles. Multiple corpus callosum lesions are present, including an 8 mm lesion in the body on the right which demonstrates restricted diffusion and faint enhancement. Multiple other lesions demonstrate T2 shine through on diffusion imaging. There are also multiple enhancing lesions in both cerebral hemispheres, the largest measuring 1.5 cm in the left frontal lobe. Juxtacortical lesions are present. There is an 8 mm nonenhancing lesion in the right pons, and there is a 7 mm lesion at the pontomedullary junction. Multiple lesions are suspected in the cervical spinal cord. There is no midline shift, intracranial hemorrhage, or extra-axial fluid collection. Ventricles and sulci are within normal limits. Orbits are unremarkable on this nondedicated study. Mild mucosal thickening is noted in the ethmoid air cells. The mastoid air cells are clear. Major intracranial vascular flow voids are preserved. MRA HEAD FINDINGS Mild motion artifact. The intracranial vertebral arteries are widely patent to the vertebrobasilar junction. Left PICA and right AICA are dominant. SCA origins are patent. Basilar artery is patent without stenosis. There is a patent right posterior communicating artery. PCAs are patent without evidence of major branch occlusion or significant proximal stenosis. The internal carotid arteries are patent from skullbase to carotid termini without stenosis. ACAs and MCAs are patent without evidence of major  branch occlusion or significant proximal stenosis. Branch vessel evaluation is mildly limited by motion artifact. No intracranial aneurysm is identified. MRA NECK FINDINGS Normal variant 4 vessel aortic arch with the left vertebral artery arising directly from the arch. Brachiocephalic and subclavian arteries appear widely patent, although the proximal left subclavian artery was incompletely imaged. Common carotid and cervical internal carotid arteries are widely patent bilaterally. Vertebral arteries are patent and codominant with antegrade flow bilaterally. No vertebral artery stenosis is identified. IMPRESSION: 1. Numerous supratentorial and infratentorial brain lesions consistent with demyelinating disease/multiple sclerosis. Enhancing/restricted lesions are consistent with active demyelination. 2. Multiple cervical spinal cord lesions suspected. 3. No evidence of significant intracranial or extracranial arterial stenosis, dissection, or major branch vessel occlusion. Electronically Signed   By: Sebastian Ache M.D.   On: 11/15/2015 21:51   Mr Laqueta Jean NF Contrast  11/15/2015  CLINICAL DATA:  Vertigo. Headache for 1 week. Prior bilateral lower extremity numbness. EXAM: MRI HEAD WITHOUT AND WITH CONTRAST MRA HEAD WITHOUT CONTRAST MRA NECK WITHOUT AND WITH CONTRAST TECHNIQUE: Multiplanar, multiecho pulse sequences of the brain and surrounding structures were obtained without and with intravenous contrast. Angiographic images of the Circle of Willis were obtained using MRA technique without intravenous contrast. Angiographic images of the neck were obtained using MRA technique without and with intravenous contrast. Carotid stenosis measurements (when applicable) are obtained utilizing NASCET criteria,  using the distal internal carotid diameter as the denominator. CONTRAST:  20mL MULTIHANCE GADOBENATE DIMEGLUMINE 529 MG/ML IV SOLN The patient experienced nausea during the IV contrast injection which resulted in  disrupted contrast timing on the neck MRA. COMPARISON:  Head CT 10/14/2015 FINDINGS: MRI HEAD FINDINGS There are patchy T2 hyperintensities throughout the cerebral white matter bilaterally. There are multiple periventricular lesions which are oriented perpendicularly to the lateral ventricles. Multiple corpus callosum lesions are present, including an 8 mm lesion in the body on the right which demonstrates restricted diffusion and faint enhancement. Multiple other lesions demonstrate T2 shine through on diffusion imaging. There are also multiple enhancing lesions in both cerebral hemispheres, the largest measuring 1.5 cm in the left frontal lobe. Juxtacortical lesions are present. There is an 8 mm nonenhancing lesion in the right pons, and there is a 7 mm lesion at the pontomedullary junction. Multiple lesions are suspected in the cervical spinal cord. There is no midline shift, intracranial hemorrhage, or extra-axial fluid collection. Ventricles and sulci are within normal limits. Orbits are unremarkable on this nondedicated study. Mild mucosal thickening is noted in the ethmoid air cells. The mastoid air cells are clear. Major intracranial vascular flow voids are preserved. MRA HEAD FINDINGS Mild motion artifact. The intracranial vertebral arteries are widely patent to the vertebrobasilar junction. Left PICA and right AICA are dominant. SCA origins are patent. Basilar artery is patent without stenosis. There is a patent right posterior communicating artery. PCAs are patent without evidence of major branch occlusion or significant proximal stenosis. The internal carotid arteries are patent from skullbase to carotid termini without stenosis. ACAs and MCAs are patent without evidence of major branch occlusion or significant proximal stenosis. Branch vessel evaluation is mildly limited by motion artifact. No intracranial aneurysm is identified. MRA NECK FINDINGS Normal variant 4 vessel aortic arch with the left  vertebral artery arising directly from the arch. Brachiocephalic and subclavian arteries appear widely patent, although the proximal left subclavian artery was incompletely imaged. Common carotid and cervical internal carotid arteries are widely patent bilaterally. Vertebral arteries are patent and codominant with antegrade flow bilaterally. No vertebral artery stenosis is identified. IMPRESSION: 1. Numerous supratentorial and infratentorial brain lesions consistent with demyelinating disease/multiple sclerosis. Enhancing/restricted lesions are consistent with active demyelination. 2. Multiple cervical spinal cord lesions suspected. 3. No evidence of significant intracranial or extracranial arterial stenosis, dissection, or major branch vessel occlusion. Electronically Signed   By: Sebastian Ache M.D.   On: 11/15/2015 21:51   Mr Cervical Spine W Wo Contrast  11/17/2015  CLINICAL DATA:  Multiple sclerosis. EXAM: MRI CERVICAL SPINE WITHOUT AND WITH CONTRAST TECHNIQUE: Multiplanar and multiecho pulse sequences of the cervical spine, to include the craniocervical junction and cervicothoracic junction, were obtained according to standard protocol without and with intravenous contrast. CONTRAST:  20 mL MultiHance COMPARISON:  Brain MRI 11/15/2015 FINDINGS: Vertebral alignment is normal. Vertebral body heights are preserved. There is diffuse cervical disc desiccation with at most mild disc space height loss. Mild disc bulging is present from C3-4 to C6-7 without significant spinal canal or neural foraminal stenosis. No vertebral marrow edema is seen. Paraspinal soft tissues are unremarkable. Small T2 hyperintense lesions are present in the pons and upper medulla has seen on the recent brain MRI. The pontine lesion does in fact demonstrate low-level enhancement which is more conspicuous on the current examination than on the prior brain MRI. There are small areas of patchy T2 hyperintensity dorsally in the cord  predominantly right of midline  at C2 and C3 with faint enhancement at C2. The spinal cord is normal in caliber. IMPRESSION: Small cord lesions at C2 and C3 consistent with demyelinating disease. Faint enhancement at C2. Electronically Signed   By: Sebastian Ache M.D.   On: 11/17/2015 13:44   Mr Thoracic Spine W Wo Contrast  11/17/2015  CLINICAL DATA:  Demyelinating disease.  Brain lesions on recent MRI. EXAM: MRI THORACIC SPINE WITHOUT AND WITH CONTRAST TECHNIQUE: Multiplanar and multiecho pulse sequences of the thoracic spine were obtained without and with intravenous contrast. CONTRAST:  20mL MULTIHANCE GADOBENATE DIMEGLUMINE 529 MG/ML IV SOLN COMPARISON:  None. FINDINGS: Vertebral alignment is normal paravertebral body heights and intervertebral disc space heights are preserved. No significant vertebral marrow edema is seen. No disc herniation, spinal stenosis, or neural foraminal stenosis is identified. There are trace bilateral pleural effusions. The cervical scratched of the thoracic spinal cord is normal in caliber. The conus medullaris terminates at T12-L1. There is an approximately 5 mm focus of T2 hyperintensity in the central/ dorsal cord at T3-4 with mild enhancement which is most apparent on axial images. An additional punctate T2 lesion is questioned at T7-8 on the sagittal STIR sequence with possible subtle T2 hyperintensity on axial images and no evidence of enhancement. IMPRESSION: 1. 5 mm mildly enhancing lesion in the spinal cord at T3-4 consistent with demyelinating disease. 2. Questionable nonenhancing lesion at T7-8. Electronically Signed   By: Sebastian Ache M.D.   On: 11/17/2015 13:36    Assessment and plan:   ANEKA FAGERSTROM is an 30 y.o. female patient with improving diplopia and gait instability on IV steroids area recommend total of 5 days of IV Solu-Medrol treatment. No new neurological symptoms. She is tolerating IV Solu-Medrol well.  We'll follow-up

## 2015-11-17 NOTE — Progress Notes (Signed)
Family Medicine Teaching Service Daily Progress Note Intern Pager: [phone]  Patient name: Alicia Barker Medical record number: [id number] Date of birth: 31-Dec-1985 Barker: 30 y.o. Gender: female  Primary Care Provider: Kathreen Cosier, MD Consultants: neuro Code Status: FULL  Pt Overview and Major Events to Date:  4/5 - admitted to [facility]; solumedrol started  Assessment and Plan: Alicia Barker is a 30 y.o. female presenting with blurred vision and dizziness . PMH is significant for depression.  Multiple sclerosis: Newly diagnosed in ED on admission given history and imaging. Likely responsible for blurred vision, dizziness, and other symptoms patient has reported over past 8 years. MRI obtained in ED consistent with MS, with multiple demyelinating lesions. Blurred vision and dizziness persist but are improving.  - Neuro following - appreciate recs - Cont IV solumedrol 250mg  q6 (day 3/5)  Headache: Present for past few days after onset of blurred vision. Improving.  - Toradol PRN - Continue to monitor   FEN/GI: regular diet, Miralax Prophylaxis: Lovenox  Disposition: home after completion of steroids  Subjective:  Patient with no complaints this AM. Endorses some persistent blurred vision, but says this has improved significantly and is now primarily only when looking R. Still endorses mild HA relieved by Toradol. Some dizziness with standing, but this is improving as well.  Patient reports that she is currently homeless, and thus would like to remain inpatient for completion of steroids rather than completing at home with home health.   Objective: Temp:  [98 F (36.7 C)-98.8 F (37.1 C)] 98.1 F (36.7 C) (04/07 0606) Pulse Rate:  [56-67] 60 (04/07 0606) Resp:  [18-20] 20 (04/07 0606) BP: (106-135)/(57-68) 117/62 mmHg (04/07 0606) SpO2:  [95 %-100 %] 99 % (04/07 0606) Physical Exam: General: sitting up in bed in NAD; husband at bedside HEENT: PERRLA, vertical and  horizontal nystagmus present but improving bilaterally Cardiovascular: RRR, no murmurs appreciated Respiratory: CTAB, no wheezes or rhonchi Abdomen: soft, non-tender, non-distended, +BS Extremities: 5/5 strength upper and lower extremities bilaterally; no LE edema Neuro: A&Ox3, no focal deficits  Psych: appropriate mood and affect  Laboratory:  Recent Labs Lab 11/15/15 1830  WBC 5.9  HGB 12.4  HCT 37.3  PLT 227    Recent Labs Lab 11/15/15 1830  NA 135  K 3.7  CL 102  CO2 21*  BUN 7  CREATININE 0.60  CALCIUM 9.4  GLUCOSE 82    Imaging/Diagnostic Tests: Mr Alicia Barker Contrast 11/15/2015 CLINICAL DATA: Vertigo. Headache for 1 week. Prior bilateral lower extremity numbness. EXAM: MRI HEAD WITHOUT AND WITH CONTRAST MRA HEAD WITHOUT CONTRAST MRA NECK WITHOUT AND WITH CONTRAST. IMPRESSION: 1. Numerous supratentorial and infratentorial brain lesions consistent with demyelinating disease/multiple sclerosis. Enhancing/restricted lesions are consistent with active demyelination. 2. Multiple cervical spinal cord lesions suspected. 3. No evidence of significant intracranial or extracranial arterial stenosis, dissection, or major branch vessel occlusion.   Marquette Saa, MD 11/17/2015, 9:15 AM PGY-1, [facility] Intern pager: [phone], text pages welcome

## 2015-11-17 NOTE — Progress Notes (Signed)
Per Rad tech, pt will need to complete her MRI and require benadryl r/t rx of contrast. MD paged at this time.  Sim Boast, RN

## 2015-11-17 NOTE — Evaluation (Signed)
Physical Therapy Evaluation Patient Details Name: Alicia Barker MRN: [id number] DOB: [birth year] Today's Date: 11/17/2015   History of Present Illness  Alicia Barker is a 30 y.o. female presenting with blurred vision and dizziness . PMH is significant for depression. Diagnosed with multiple sclerosis.  Clinical Impression  Pt admitted with above diagnosis. Pt currently with functional limitations due to the deficits listed below (see PT Problem List). Demonstrates significant dizziness, most notably exacerbated by right and downward gaze, very apprehensive with cervical ROM. (Sustained nystagmus with right gaze reporting symptoms of oscillopsia.) Resting dizziness 6/10. Requires close guard to ambulate up to 45 feet today with LT/RT drift and decreased dorsiflexion on Lt although strength 4/5 with manual muscle testing. No buckling or over staggering with gait. Unstable standing with cane this date. Educated extensively on habituation exercises, and safety with mobility. Has a flight of stairs to navigate into apartment. Pt will benefit from skilled PT to increase their independence and safety with mobility to allow discharge to the venue listed below.       Follow Up Recommendations CIR    Equipment Recommendations  Rolling walker with 5" wheels    Recommendations for Other Services Rehab consult;OT consult     Precautions / Restrictions Precautions Precautions: Fall Restrictions Weight Bearing Restrictions: No      Mobility  Bed Mobility Overal bed mobility: Modified Independent             General bed mobility comments: Extra time in/out of bed. Very apprehensive, keeping head straight, heavy use of rail, but no physical assist needed.  Transfers Overall transfer level: Needs assistance Equipment used: Rolling walker (2 wheeled);Straight cane Transfers: Sit to/from Stand Sit to Stand: Min guard         General transfer comment: Close guard for safety. Practiced  with cane with notable posterior lean, holding bed rail with other UE and too apprehensive to release. Performed with RW, and cues to lean anteriorly. VC for hand placement. Better stability with RW.  Ambulation/Gait Ambulation/Gait assistance: Min guard Ambulation Distance (Feet): 45 Feet Assistive device: Rolling walker (2 wheeled) Gait Pattern/deviations: Step-through pattern;Decreased step length - left;Decreased stride length;Decreased dorsiflexion - left;Shuffle;Drifts right/left Gait velocity: slow Gait velocity interpretation: <1.8 ft/sec, indicative of risk for recurrent falls General Gait Details: Drifting but without stagger or buckling. Very rigid posture, apprehensive and will barely move head LT/RT. Cannot look down due to dizziness but able to modestly turn head up with out symptom exacerbation. Practiced backwards stepping with close guard. Very slow and guarded throughout short distance, limited by dizziness and fear. Cues for Lt foot clearance and larger steps.  Stairs            Wheelchair Mobility    Modified Rankin (Stroke Patients Only)       Balance Overall balance assessment: Needs assistance;History of Falls Sitting-balance support: No upper extremity supported;Feet supported Sitting balance-Leahy Scale: Fair     Standing balance support: Single extremity supported Standing balance-Leahy Scale: Poor                               Pertinent Vitals/Pain Pain Assessment: No/denies pain ("just dizzy")    Home Living Family/patient expects to be discharged to:: Private residence Living Arrangements: Spouse/significant other;Children Available Help at Discharge: Family;Available 24 hours/day (Husband works, mother can stay with pt.) Type of Home: Apartment Home Access: Stairs to enter Entrance Stairs-Rails: Doctor, general practice of Steps: flight  Home Layout: One level Home Equipment: None      Prior Function Level of  Independence: Independent         Comments: Reports she has been falling more frequently and now has to get dressed in bed.     Hand Dominance        Extremity/Trunk Assessment   Upper Extremity Assessment: Defer to OT evaluation           Lower Extremity Assessment: LLE deficits/detail;RLE deficits/detail (Grossly normal, LLE slight distal weakness) RLE Deficits / Details: Reports radiating pain/tingling at times from back through anterior thighs BIL. +3 DTR patella and achilles, no clonus LLE Deficits / Details: 4/5 ankle dorsiflexion and hallux extension. Normal light touch. Reports radiating pain/tingling at times from back through anterior thighs BIL. +3 DTR patella and achilles, no clonus     Communication   Communication: No difficulties  Cognition Arousal/Alertness: Awake/alert Behavior During Therapy: WFL for tasks assessed/performed Overall Cognitive Status: Within Functional Limits for tasks assessed                      General Comments General comments (skin integrity, edema, etc.): Sustained nystagmus with right gaze, exacerbates "dizziness" described more as an oscillopsia. Saccades with convergence of eyes. Apprehensive to look down due to dizziness and fear of symptom onset. Normal VOR cancellation. With head supported upright in bed, pt was able to tolerate x1 viewing slowly, both horizontal and vertical. Extensive education provided about habituation exercises, safety with mobility, and management/ self care at home.    Exercises Other Exercises Other Exercises: x1 viewing vertical/horizontal, as tolerated x30 Other Exercises: side bending, forward flexion cervical stretches as tolerated.      Assessment/Plan    PT Assessment Patient needs continued PT services  PT Diagnosis Difficulty walking;Abnormality of gait   PT Problem List Decreased range of motion;Decreased activity tolerance;Decreased balance;Decreased mobility;Decreased  coordination;Decreased knowledge of use of DME;Decreased safety awareness;Pain  PT Treatment Interventions DME instruction;Gait training;Stair training;Functional mobility training;Therapeutic activities;Therapeutic exercise;Balance training;Neuromuscular re-education;Patient/family education   PT Goals (Current goals can be found in the Care Plan section) Acute Rehab PT Goals Patient Stated Goal: Get help. PT Goal Formulation: With patient/family Time For Goal Achievement: 12/01/15 Potential to Achieve Goals: Good    Frequency Min 4X/week   Barriers to discharge        Co-evaluation               End of Session Equipment Utilized During Treatment: Gait belt Activity Tolerance: Other (comment) (Limited by dizziness) Patient left: in bed;with call bell/phone within reach;with bed alarm set;with family/visitor present Nurse Communication: Mobility status         Time: 1610-9604 PT Time Calculation (min) (ACUTE ONLY): 38 min   Charges:   PT Evaluation $PT Eval Moderate Complexity: 1 Procedure PT Treatments $Gait Training: 8-22 mins $Neuromuscular Re-education: 8-22 mins   PT G Codes:        Berton Mount 11/17/2015, 6:36 PM  [facility] [place], [place] [phone]

## 2015-11-18 LAB — GLUCOSE, CAPILLARY
GLUCOSE-CAPILLARY: 117 mg/dL — AB (ref 65–99)
Glucose-Capillary: 177 mg/dL — ABNORMAL HIGH (ref 65–99)
Glucose-Capillary: 144 mg/dL — ABNORMAL HIGH (ref 65–99)

## 2015-11-18 MED ORDER — ONDANSETRON 4 MG PO TBDP
8.0000 mg | ORAL_TABLET | Freq: Once | ORAL | Status: AC
  Administered 2015-11-19: 8 mg via ORAL
  Filled 2015-11-18 (×2): qty 2

## 2015-11-18 MED ORDER — IBUPROFEN 200 MG PO TABS
600.0000 mg | ORAL_TABLET | Freq: Four times a day (QID) | ORAL | Status: DC | PRN
  Administered 2015-11-19 – 2015-11-20 (×2): 600 mg via ORAL
  Filled 2015-11-18 (×3): qty 3

## 2015-11-18 MED ORDER — ACETAMINOPHEN 325 MG PO TABS
650.0000 mg | ORAL_TABLET | Freq: Four times a day (QID) | ORAL | Status: DC | PRN
  Administered 2015-11-19: 650 mg via ORAL
  Filled 2015-11-18: qty 2

## 2015-11-18 NOTE — Progress Notes (Signed)
Rehab Admissions Coordinator Note:  Patient was screened by Trish Mage for appropriateness for an Inpatient Acute Rehab Consult.  At this time, we are recommending Inpatient Rehab consult.  Trish Mage 11/18/2015, 11:45 AM  I can be reached at [phone].

## 2015-11-18 NOTE — Evaluation (Signed)
Occupational Therapy Evaluation Patient Details Name: Alicia Barker MRN: [id number] DOB: 31-Dec-1985 Today's Date: 11/18/2015    History of Present Illness Alicia Barker is a 30 y.o. female presenting with blurred vision and dizziness . PMH is significant for depression. Diagnosed with multiple sclerosis.   Clinical Impression   Pt was having greater difficulty managing ADL and IADL at home with increased balance impairment, but did not require assistance. She presents with impaired vision, dizziness, generalized fatigue and weakness and impaired balance interfering with ability to perform mobility and self care.  Began education in vision compensatory strategies. Will follow acutely.    Follow Up Recommendations  CIR    Equipment Recommendations  Tub/shower seat    Recommendations for Other Services       Precautions / Restrictions Precautions Precautions: Fall Restrictions Weight Bearing Restrictions: No      Mobility Bed Mobility Overal bed mobility: Modified Independent             General bed mobility comments: no physical assist, moves slowly, using rail  Transfers Overall transfer level: Needs assistance Equipment used: 1 person hand held assist Transfers: Sit to/from Stand Sit to Stand: Min guard         General transfer comment: hand held assist in small room to ambulate to bathroom    Balance Overall balance assessment: Needs assistance   Sitting balance-Leahy Scale: Fair       Standing balance-Leahy Scale: Poor Standing balance comment: posterior bias                            ADL Overall ADL's : Needs assistance/impaired Eating/Feeding: Modified independent;Sitting Eating/Feeding Details (indicate cue type and reason): pt closes R eye and positions tray at eye level to avoid downward gaze Grooming: Wash/dry hands;Min guard;Standing   Upper Body Bathing: Supervision/ safety;Sitting   Lower Body Bathing: Minimal  assistance;Sitting/lateral leans   Upper Body Dressing : Minimal assistance;Sitting   Lower Body Dressing: Minimal assistance;Sitting/lateral leans   Toilet Transfer: Min guard;Ambulation   Toileting- Clothing Manipulation and Hygiene: Min guard;Sit to/from stand       Functional mobility during ADLs: Min guard;Cueing for safety       Vision Additional Comments: R eye dominant, diplopia is resolved, + photophobia, R eye more blurred than L, reports dizziness with downward gaze, educated in gaze stabilization and use of peripheral vision during mobilization, pt with neck pain with attempt to stabilize vision, minimizes head movement, unable to read    Perception     Praxis      Pertinent Vitals/Pain Pain Assessment: Faces Faces Pain Scale: Hurts even more Pain Location: head Pain Descriptors / Indicators: Aching Pain Intervention(s): Monitored during session;Limited activity within patient's tolerance     Hand Dominance Right   Extremity/Trunk Assessment Upper Extremity Assessment Upper Extremity Assessment: Overall WFL for tasks assessed   Lower Extremity Assessment Lower Extremity Assessment: Defer to PT evaluation   Cervical / Trunk Assessment Cervical / Trunk Assessment: Normal   Communication Communication Communication: No difficulties   Cognition Arousal/Alertness: Awake/alert Behavior During Therapy: WFL for tasks assessed/performed Overall Cognitive Status: Within Functional Limits for tasks assessed                     General Comments       Exercises       Shoulder Instructions      Home Living Family/patient expects to be discharged to:: Private residence  Living Arrangements: Spouse/significant other;Children Available Help at Discharge: Family;Available 24 hours/day (husband works, mom can stay with pt) Type of Home: Apartment Home Access: Stairs to enter [employer] of Steps: flight Entrance Stairs-Rails:  Right;Left Home Layout: One level     Bathroom Shower/Tub: Producer, television/film/video: Standard     Home Equipment: None          Prior Functioning/Environment Level of Independence: Independent        Comments: Reports she has been falling more frequently and now has to get dressed in bed.    OT Diagnosis: Generalized weakness;Acute pain;Disturbance of vision   OT Problem List: Decreased activity tolerance;Impaired balance (sitting and/or standing);Impaired vision/perception;Decreased coordination;Decreased knowledge of use of DME or AE;Pain   OT Treatment/Interventions: Self-care/ADL training;DME and/or AE instruction;Therapeutic activities;Visual/perceptual remediation/compensation;Patient/family education;Balance training    OT Goals(Current goals can be found in the care plan section) Acute Rehab OT Goals Patient Stated Goal: Get help. OT Goal Formulation: With patient Time For Goal Achievement: 12/02/15 Potential to Achieve Goals: Good ADL Goals Pt Will Perform Grooming: standing;with supervision Pt Will Perform Upper Body Dressing: with supervision;sitting Pt Will Perform Lower Body Dressing: with supervision;sit to/from stand Pt Will Transfer to Toilet: with supervision;ambulating;regular height toilet Pt Will Perform Toileting - Clothing Manipulation and hygiene: with supervision;sit to/from stand Pt Will Perform Tub/Shower Transfer: Shower transfer;with supervision;ambulating;shower seat Additional ADL Goal #1: Pt will utilize compensatory strategies for vision independently.  OT Frequency: Min 3X/week   Barriers to D/C:            Co-evaluation              End of Session Equipment Utilized During Treatment: Gait belt  Activity Tolerance: Patient limited by pain;Patient limited by fatigue Patient left: in bed;with call bell/phone within reach   Time: 0920-0943 OT Time Calculation (min): 23 min Charges:  OT General Charges $OT Visit: 1  Procedure OT Evaluation $OT Eval Moderate Complexity: 1 Procedure OT Treatments $Self Care/Home Management : 8-22 mins G-Codes:    Evern Bio 11/18/2015, 10:03 AM  [phone]

## 2015-11-18 NOTE — Discharge Summary (Signed)
Family Medicine Teaching [facility] Discharge Summary  Patient name: SIIRI DUGUID Medical record number: [id number] Date of birth: [birth year] Age: 30 y.o. Gender: female Date of Admission: 11/15/2015  Date of Discharge: 11/20/15 Admitting Physician: Nestor Ramp, MD  Primary Care Provider: Kathreen Cosier, MD Consultants: Neurology, PM&R  Indication for Hospitalization: blurred vision, dizziness  Discharge Diagnoses/Problem List:  Patient Active Problem List   Diagnosis Date Noted  . 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 Dizziness   . Multiple sclerosis (HCC) 11/15/2015    Disposition: home  Discharge Condition: stable  Discharge Exam:  General: lying in bed in NAD HEENT: PERRLA, EOMI. No nystagmus noted.  Cardiovascular: RRR, no murmurs appreciated Respiratory: CTAB, no wheezes or rhonchi, normal work of breathing on RA Abdomen: soft, non-tender, non-distended, +BS Extremities: 5/5 strength upper and lower extremities bilaterally Neuro: A&Ox3, no focal deficits  Psych: appropriate mood and affect  Brief Hospital Course:  Patient presented with blurred vision and dizziness, and was subsequently diagnosed with multiple sclerosis.   Multiple sclerosis Patient presented with five days of blurred vision, horizontal and vertical nystagmus, and dizziness upon standing. Also reported a few days of worsening headache. Patient also endorsed transient episodes of lower extremity paralysis and loss of sensation that resolved spontaneously over the last eight years. MRI obtained in ED showed multiple demyelinating lesions, and she was subsequently diagnosed with multiple sclerosis given imaging results and history. She was subsequently admitted for five days of high dose IV steroids.  Patient's symptoms improved with steroid administration. Patient initially had continued dizziness, but was able to improve her gait and stability upon working with PT. After completion of  steroids, she was deemed stable for discharge with outpatient neuro f/u.   Issues for Follow Up:  1. Appointment scheduled for patient with Dr. Epimenio Foot (MS specialist) for 11/28/15. Please ensure patient attends this appointment.  2. Patient set up with outpatient physical therapy.   Significant Procedures: none  Significant Labs and Imaging:   Recent Labs Lab 11/15/15 1830  WBC 5.9  HGB 12.4  HCT 37.3  PLT 227    Recent Labs Lab 11/15/15 1830  NA 135  K 3.7  CL 102  CO2 21*  GLUCOSE 82  BUN 7  CREATININE 0.60  CALCIUM 9.4    Results/Tests Pending at Time of Discharge: none  Discharge Medications:    Medication List    TAKE these medications        acetaminophen 500 MG tablet  Commonly known as:  TYLENOL  Take 500 mg by mouth every 6 (six) hours as needed for mild pain.     meclizine 25 MG tablet  Commonly known as:  ANTIVERT  Take 1 tablet (25 mg total) by mouth 3 (three) times daily as needed for dizziness.     ondansetron 4 MG tablet  Commonly known as:  ZOFRAN  Take 1 tablet (4 mg total) by mouth every 6 (six) hours. Prn nausea or vomiting        Discharge Instructions: Please refer to Patient Instructions section of EMR for full details.  Patient was counseled important signs and symptoms that should prompt return to medical care, changes in medications, dietary instructions, activity restrictions, and follow up appointments.   Follow-Up Appointments: Follow-up Information    Follow up with Levert Feinstein, MD. Go on 11/23/2015.   Specialty:  Family Medicine   Why:  For hospital follow-up at 10:30 AM.   Contact information:   [address]  [address] [place] [address] [phone]       Follow up with SATER,RICHARD A, MD On 11/28/2015.   Specialty:  Neurology   Why:  For Hospital Followup at 9:30 am    Contact information:   [address] [place] [address] [phone]       Follow up with [facility].   Specialty:  Rehabilitation   Why:  They will contact you for the first visit.    Contact information:   [address] [id number] mc [place] [address] [phone]      Marquette Saa, MD 11/20/2015, 4:16 PM PGY-1, [facility] Family [facility]

## 2015-11-18 NOTE — Progress Notes (Signed)
Interval History:                                                                                                                      Alicia Barker is an 30 y.o. female patient with  newly diagnosed multiple sclerosis as described in the previous notes. She is tolerationg steroids well. The diplopia  symptoms are improved.  she is working with physical therapy.  No other new neurological symptoms.   Past Medical History: Past Medical History  Diagnosis Date  . SVD (spontaneous vaginal delivery)     x 5  . Depression     no meds currently    Past Surgical History  Procedure Laterality Date  . Right hand surgery    . Laparoscopic tubal ligation Bilateral 01/06/2013    Procedure: LAPAROSCOPIC TUBAL LIGATION;  Surgeon: Kathreen Cosier, MD;  Location: [facility];  Service: Gynecology;  Laterality: Bilateral;    Family History: Family History  Problem Relation Age of Onset  . Diabetes Mother   . Heart disease Mother   . Diabetes Father     Social History:   reports that she has been smoking Cigarettes.  She has been smoking about 0.10 packs per day. She has never used smokeless tobacco. She reports that she does not drink alcohol or use illicit drugs.  Allergies:  Allergies  Allergen Reactions  . Penicillins Anaphylaxis, Shortness Of Breath and Swelling    Has patient had a PCN reaction causing immediate rash, facial/tongue/throat swelling, SOB or lightheadedness with hypotension: YES Has patient had a PCN reaction causing severe rash involving mucus membranes or skin necrosis: NO Has patient had a PCN reaction that required hospitalization NO Has patient had a PCN reaction occurring within the last 10 years: NO If all of the above answers are "NO", then may proceed with Cephalosporin use.     Medications:                                                                                                                         Current facility-administered medications:  .   diphenhydrAMINE (BENADRYL) capsule 25 mg, 25 mg, Oral, Q6H PRN, Marquette Saa, MD, 25 mg at 11/17/15 1158 .  enoxaparin (LOVENOX) injection 40 mg, 40 mg, Subcutaneous, Daily, Marquette Saa, MD, 40 mg at 11/18/15 0944 .  ketorolac (TORADOL) tablet 10 mg, 10 mg, Oral, Q6H PRN, Erasmo Downer, MD, 10 mg at 11/18/15 1012 .  methylPREDNISolone sodium  succinate (SOLU-MEDROL) 250 mg in sodium chloride 0.9 % 50 mL IVPB, 250 mg, Intravenous, Q6H, Jyren Cerasoli Daniel Nones, MD, 250 mg at 11/18/15 1538 .  ondansetron (ZOFRAN-ODT) disintegrating tablet 4 mg, 4 mg, Oral, Q8H PRN, Marquette Saa, MD, 4 mg at 11/18/15 1812 .  ondansetron (ZOFRAN-ODT) disintegrating tablet 8 mg, 8 mg, Oral, Once, Marquette Saa, MD .  pantoprazole (PROTONIX) EC tablet 40 mg, 40 mg, Oral, Daily, Ricketta Colantonio Daniel Nones, MD, 40 mg at 11/18/15 0944 .  polyethylene glycol (MIRALAX / GLYCOLAX) packet 17 g, 17 g, Oral, Daily PRN, Marquette Saa, MD, 17 g at 11/17/15 1657 .  senna (SENOKOT) tablet 8.6 mg, 1 tablet, Oral, Daily, Marquette Saa, MD, 8.6 mg at 11/18/15 0944   Neurologic Examination:                                                                                                     Today's Vitals   11/18/15 0955 11/18/15 1112 11/18/15 1355 11/18/15 1743  BP:   123/66 131/74  Pulse:   61 56  Temp:   98.4 F (36.9 C) 98.4 F (36.9 C)  TempSrc:   Oral Oral  Resp:   18 18  Height:      Weight:      SpO2:   100% 100%  PainSc: 6  Asleep      Evaluation of higher integrative functions including: Level of alertness: Alert,  Oriented to time, place and person Speech: fluent, no evidence of dysarthria or aphasia noted.  Test the following cranial nerves: 2-12 grossly intact. The right gaze nystagmus have significant improved with some residual transient 2-4 beat nystagmus with end gaze.  Motor examination: full 5/5 motor strength in all 4  extremities    Lab Results: Basic Metabolic Panel:  Recent Labs Lab 11/15/15 1830  NA 135  K 3.7  CL 102  CO2 21*  GLUCOSE 82  BUN 7  CREATININE 0.60  CALCIUM 9.4    Liver Function Tests: No results for input(s): AST, ALT, ALKPHOS, BILITOT, PROT, ALBUMIN in the last 168 hours. No results for input(s): LIPASE, AMYLASE in the last 168 hours. No results for input(s): AMMONIA in the last 168 hours.  CBC:  Recent Labs Lab 11/15/15 1830  WBC 5.9  NEUTROABS 3.4  HGB 12.4  HCT 37.3  MCV 86.5  PLT 227    Cardiac Enzymes: No results for input(s): CKTOTAL, CKMB, CKMBINDEX, TROPONINI in the last 168 hours.  Lipid Panel: No results for input(s): CHOL, TRIG, HDL, CHOLHDL, VLDL, LDLCALC in the last 168 hours.  CBG:  Recent Labs Lab 11/17/15 1224 11/17/15 1648 11/17/15 2209 11/18/15 0633 11/18/15 1104  GLUCAP 132* 196* 150* 117* 177*    Microbiology: Results for orders placed or performed during the hospital encounter of 01/01/13  Surgical pcr screen     Status: None   Collection Time: 01/01/13 10:51 AM  Result Value Ref Range Status   MRSA, PCR NEGATIVE NEGATIVE Final   Staphylococcus aureus NEGATIVE NEGATIVE Final    Comment:  The Xpert SA Assay (FDA approved for NASAL specimens in patients over 59 years of age), is one component of a comprehensive surveillance program.  Test performance has been validated by [employer] for patients greater than or equal to 42 year old. It is not intended to diagnose infection nor to guide or monitor treatment.    Imaging: Mr Angiogram Head Wo Contrast  11/15/2015  CLINICAL DATA:  Vertigo. Headache for 1 week. Prior bilateral lower extremity numbness. EXAM: MRI HEAD WITHOUT AND WITH CONTRAST MRA HEAD WITHOUT CONTRAST MRA NECK WITHOUT AND WITH CONTRAST TECHNIQUE: Multiplanar, multiecho pulse sequences of the brain and surrounding structures were obtained without and with intravenous contrast. Angiographic images  of the Circle of Willis were obtained using MRA technique without intravenous contrast. Angiographic images of the neck were obtained using MRA technique without and with intravenous contrast. Carotid stenosis measurements (when applicable) are obtained utilizing NASCET criteria, using the distal internal carotid diameter as the denominator. CONTRAST:  20mL MULTIHANCE GADOBENATE DIMEGLUMINE 529 MG/ML IV SOLN The patient experienced nausea during the IV contrast injection which resulted in disrupted contrast timing on the neck MRA. COMPARISON:  Head CT 10/14/2015 FINDINGS: MRI HEAD FINDINGS There are patchy T2 hyperintensities throughout the cerebral white matter bilaterally. There are multiple periventricular lesions which are oriented perpendicularly to the lateral ventricles. Multiple corpus callosum lesions are present, including an 8 mm lesion in the body on the right which demonstrates restricted diffusion and faint enhancement. Multiple other lesions demonstrate T2 shine through on diffusion imaging. There are also multiple enhancing lesions in both cerebral hemispheres, the largest measuring 1.5 cm in the left frontal lobe. Juxtacortical lesions are present. There is an 8 mm nonenhancing lesion in the right pons, and there is a 7 mm lesion at the pontomedullary junction. Multiple lesions are suspected in the cervical spinal cord. There is no midline shift, intracranial hemorrhage, or extra-axial fluid collection. Ventricles and sulci are within normal limits. Orbits are unremarkable on this nondedicated study. Mild mucosal thickening is noted in the ethmoid air cells. The mastoid air cells are clear. Major intracranial vascular flow voids are preserved. MRA HEAD FINDINGS Mild motion artifact. The intracranial vertebral arteries are widely patent to the vertebrobasilar junction. Left PICA and right AICA are dominant. SCA origins are patent. Basilar artery is patent without stenosis. There is a patent right  posterior communicating artery. PCAs are patent without evidence of major branch occlusion or significant proximal stenosis. The internal carotid arteries are patent from skullbase to carotid termini without stenosis. ACAs and MCAs are patent without evidence of major branch occlusion or significant proximal stenosis. Branch vessel evaluation is mildly limited by motion artifact. No intracranial aneurysm is identified. MRA NECK FINDINGS Normal variant 4 vessel aortic arch with the left vertebral artery arising directly from the arch. Brachiocephalic and subclavian arteries appear widely patent, although the proximal left subclavian artery was incompletely imaged. Common carotid and cervical internal carotid arteries are widely patent bilaterally. Vertebral arteries are patent and codominant with antegrade flow bilaterally. No vertebral artery stenosis is identified. IMPRESSION: 1. Numerous supratentorial and infratentorial brain lesions consistent with demyelinating disease/multiple sclerosis. Enhancing/restricted lesions are consistent with active demyelination. 2. Multiple cervical spinal cord lesions suspected. 3. No evidence of significant intracranial or extracranial arterial stenosis, dissection, or major branch vessel occlusion. Electronically Signed   By: Sebastian Ache M.D.   On: 11/15/2015 21:51   Mr Angiogram Neck W Wo Contrast  11/15/2015  CLINICAL DATA:  Vertigo. Headache for 1  week. Prior bilateral lower extremity numbness. EXAM: MRI HEAD WITHOUT AND WITH CONTRAST MRA HEAD WITHOUT CONTRAST MRA NECK WITHOUT AND WITH CONTRAST TECHNIQUE: Multiplanar, multiecho pulse sequences of the brain and surrounding structures were obtained without and with intravenous contrast. Angiographic images of the Circle of Willis were obtained using MRA technique without intravenous contrast. Angiographic images of the neck were obtained using MRA technique without and with intravenous contrast. Carotid stenosis measurements  (when applicable) are obtained utilizing NASCET criteria, using the distal internal carotid diameter as the denominator. CONTRAST:  35mL MULTIHANCE GADOBENATE DIMEGLUMINE 529 MG/ML IV SOLN The patient experienced nausea during the IV contrast injection which resulted in disrupted contrast timing on the neck MRA. COMPARISON:  Head CT 10/14/2015 FINDINGS: MRI HEAD FINDINGS There are patchy T2 hyperintensities throughout the cerebral white matter bilaterally. There are multiple periventricular lesions which are oriented perpendicularly to the lateral ventricles. Multiple corpus callosum lesions are present, including an 8 mm lesion in the body on the right which demonstrates restricted diffusion and faint enhancement. Multiple other lesions demonstrate T2 shine through on diffusion imaging. There are also multiple enhancing lesions in both cerebral hemispheres, the largest measuring 1.5 cm in the left frontal lobe. Juxtacortical lesions are present. There is an 8 mm nonenhancing lesion in the right pons, and there is a 7 mm lesion at the pontomedullary junction. Multiple lesions are suspected in the cervical spinal cord. There is no midline shift, intracranial hemorrhage, or extra-axial fluid collection. Ventricles and sulci are within normal limits. Orbits are unremarkable on this nondedicated study. Mild mucosal thickening is noted in the ethmoid air cells. The mastoid air cells are clear. Major intracranial vascular flow voids are preserved. MRA HEAD FINDINGS Mild motion artifact. The intracranial vertebral arteries are widely patent to the vertebrobasilar junction. Left PICA and right AICA are dominant. SCA origins are patent. Basilar artery is patent without stenosis. There is a patent right posterior communicating artery. PCAs are patent without evidence of major branch occlusion or significant proximal stenosis. The internal carotid arteries are patent from skullbase to carotid termini without stenosis. ACAs and  MCAs are patent without evidence of major branch occlusion or significant proximal stenosis. Branch vessel evaluation is mildly limited by motion artifact. No intracranial aneurysm is identified. MRA NECK FINDINGS Normal variant 4 vessel aortic arch with the left vertebral artery arising directly from the arch. Brachiocephalic and subclavian arteries appear widely patent, although the proximal left subclavian artery was incompletely imaged. Common carotid and cervical internal carotid arteries are widely patent bilaterally. Vertebral arteries are patent and codominant with antegrade flow bilaterally. No vertebral artery stenosis is identified. IMPRESSION: 1. Numerous supratentorial and infratentorial brain lesions consistent with demyelinating disease/multiple sclerosis. Enhancing/restricted lesions are consistent with active demyelination. 2. Multiple cervical spinal cord lesions suspected. 3. No evidence of significant intracranial or extracranial arterial stenosis, dissection, or major branch vessel occlusion. Electronically Signed   By: Sebastian Ache M.D.   On: 11/15/2015 21:51   Mr Laqueta Jean PJ Contrast  11/15/2015  CLINICAL DATA:  Vertigo. Headache for 1 week. Prior bilateral lower extremity numbness. EXAM: MRI HEAD WITHOUT AND WITH CONTRAST MRA HEAD WITHOUT CONTRAST MRA NECK WITHOUT AND WITH CONTRAST TECHNIQUE: Multiplanar, multiecho pulse sequences of the brain and surrounding structures were obtained without and with intravenous contrast. Angiographic images of the Circle of Willis were obtained using MRA technique without intravenous contrast. Angiographic images of the neck were obtained using MRA technique without and with intravenous contrast. Carotid stenosis measurements (when applicable)  are obtained utilizing NASCET criteria, using the distal internal carotid diameter as the denominator. CONTRAST:  20mL MULTIHANCE GADOBENATE DIMEGLUMINE 529 MG/ML IV SOLN The patient experienced nausea during the IV  contrast injection which resulted in disrupted contrast timing on the neck MRA. COMPARISON:  Head CT 10/14/2015 FINDINGS: MRI HEAD FINDINGS There are patchy T2 hyperintensities throughout the cerebral white matter bilaterally. There are multiple periventricular lesions which are oriented perpendicularly to the lateral ventricles. Multiple corpus callosum lesions are present, including an 8 mm lesion in the body on the right which demonstrates restricted diffusion and faint enhancement. Multiple other lesions demonstrate T2 shine through on diffusion imaging. There are also multiple enhancing lesions in both cerebral hemispheres, the largest measuring 1.5 cm in the left frontal lobe. Juxtacortical lesions are present. There is an 8 mm nonenhancing lesion in the right pons, and there is a 7 mm lesion at the pontomedullary junction. Multiple lesions are suspected in the cervical spinal cord. There is no midline shift, intracranial hemorrhage, or extra-axial fluid collection. Ventricles and sulci are within normal limits. Orbits are unremarkable on this nondedicated study. Mild mucosal thickening is noted in the ethmoid air cells. The mastoid air cells are clear. Major intracranial vascular flow voids are preserved. MRA HEAD FINDINGS Mild motion artifact. The intracranial vertebral arteries are widely patent to the vertebrobasilar junction. Left PICA and right AICA are dominant. SCA origins are patent. Basilar artery is patent without stenosis. There is a patent right posterior communicating artery. PCAs are patent without evidence of major branch occlusion or significant proximal stenosis. The internal carotid arteries are patent from skullbase to carotid termini without stenosis. ACAs and MCAs are patent without evidence of major branch occlusion or significant proximal stenosis. Branch vessel evaluation is mildly limited by motion artifact. No intracranial aneurysm is identified. MRA NECK FINDINGS Normal variant 4  vessel aortic arch with the left vertebral artery arising directly from the arch. Brachiocephalic and subclavian arteries appear widely patent, although the proximal left subclavian artery was incompletely imaged. Common carotid and cervical internal carotid arteries are widely patent bilaterally. Vertebral arteries are patent and codominant with antegrade flow bilaterally. No vertebral artery stenosis is identified. IMPRESSION: 1. Numerous supratentorial and infratentorial brain lesions consistent with demyelinating disease/multiple sclerosis. Enhancing/restricted lesions are consistent with active demyelination. 2. Multiple cervical spinal cord lesions suspected. 3. No evidence of significant intracranial or extracranial arterial stenosis, dissection, or major branch vessel occlusion. Electronically Signed   By: Sebastian Ache M.D.   On: 11/15/2015 21:51   Mr Cervical Spine W Wo Contrast  11/17/2015  CLINICAL DATA:  Multiple sclerosis. EXAM: MRI CERVICAL SPINE WITHOUT AND WITH CONTRAST TECHNIQUE: Multiplanar and multiecho pulse sequences of the cervical spine, to include the craniocervical junction and cervicothoracic junction, were obtained according to standard protocol without and with intravenous contrast. CONTRAST:  20 mL MultiHance COMPARISON:  Brain MRI 11/15/2015 FINDINGS: Vertebral alignment is normal. Vertebral body heights are preserved. There is diffuse cervical disc desiccation with at most mild disc space height loss. Mild disc bulging is present from C3-4 to C6-7 without significant spinal canal or neural foraminal stenosis. No vertebral marrow edema is seen. Paraspinal soft tissues are unremarkable. Small T2 hyperintense lesions are present in the pons and upper medulla has seen on the recent brain MRI. The pontine lesion does in fact demonstrate low-level enhancement which is more conspicuous on the current examination than on the prior brain MRI. There are small areas of patchy T2 hyperintensity  dorsally in the  cord predominantly right of midline at C2 and C3 with faint enhancement at C2. The spinal cord is normal in caliber. IMPRESSION: Small cord lesions at C2 and C3 consistent with demyelinating disease. Faint enhancement at C2. Electronically Signed   By: Sebastian Ache M.D.   On: 11/17/2015 13:44   Mr Thoracic Spine W Wo Contrast  11/17/2015  CLINICAL DATA:  Demyelinating disease.  Brain lesions on recent MRI. EXAM: MRI THORACIC SPINE WITHOUT AND WITH CONTRAST TECHNIQUE: Multiplanar and multiecho pulse sequences of the thoracic spine were obtained without and with intravenous contrast. CONTRAST:  20mL MULTIHANCE GADOBENATE DIMEGLUMINE 529 MG/ML IV SOLN COMPARISON:  None. FINDINGS: Vertebral alignment is normal paravertebral body heights and intervertebral disc space heights are preserved. No significant vertebral marrow edema is seen. No disc herniation, spinal stenosis, or neural foraminal stenosis is identified. There are trace bilateral pleural effusions. The cervical scratched of the thoracic spinal cord is normal in caliber. The conus medullaris terminates at T12-L1. There is an approximately 5 mm focus of T2 hyperintensity in the central/ dorsal cord at T3-4 with mild enhancement which is most apparent on axial images. An additional punctate T2 lesion is questioned at T7-8 on the sagittal STIR sequence with possible subtle T2 hyperintensity on axial images and no evidence of enhancement. IMPRESSION: 1. 5 mm mildly enhancing lesion in the spinal cord at T3-4 consistent with demyelinating disease. 2. Questionable nonenhancing lesion at T7-8. Electronically Signed   By: Sebastian Ache M.D.   On: 11/17/2015 13:36    Assessment and plan:   Alicia Barker is an 30 y.o. female patient with improving diplopia and gait instability on IV steroids area recommend total of 5 days of IV Solu-Medrol treatment. No new neurological symptoms. She is tolerating IV Solu-Medrol well.  We'll  follow-up

## 2015-11-18 NOTE — Progress Notes (Signed)
Family Medicine Teaching Service Daily Progress Note Intern Pager: [phone]  Patient name: Alicia Barker Medical record number: [id number] Date of birth: 02/26/1986 Age: 30 y.o. Gender: female  Primary Care Provider: Kathreen Cosier, MD Consultants: neuro Code Status: FULL  Pt Overview and Major Events to Date:  4/5 - admitted to [facility]; solumedrol started  Assessment and Plan: Alicia Barker is a 30 y.o. female presenting with blurred vision and dizziness . PMH is significant for depression.  Multiple sclerosis: Newly diagnosed in ED on admission given history and imaging. Likely responsible for blurred vision, dizziness, and other symptoms patient has reported over past 8 years. MRI obtained in ED consistent with MS, with multiple demyelinating lesions. Blurred vision and dizziness persist but continue to improve. - Neuro following - appreciate recs - Cont IV solumedrol  q6 (day 4/5)  Headache: Present for past few days after onset of blurred vision. Persistent but responding to Toradol. - Toradol PRN - Continue to monitor   FEN/GI: regular diet, Miralax Prophylaxis: Lovenox  Disposition: home after completion of steroids  Subjective:  No acute events overnight. Still complaining of HA but has relief with Toradol.   Objective: Temp:  [98.5 F (36.9 C)-98.9 F (37.2 C)] 98.5 F (36.9 C) (04/08 0901) Pulse Rate:  [45-85] 85 (04/08 0901) Resp:  [16-20] 20 (04/08 0901) BP: (108-133)/(43-72) 133/72 mmHg (04/08 0901) SpO2:  [97 %-100 %] 100 % (04/08 0901) Physical Exam: General:resting comfortably in bed in NAD; husband at bedside HEENT: minimal horizontal nystagmus, no vertical nystagmus; EOMI Cardiovascular: RRR, no murmurs appreciated Respiratory: CTAB, no wheezes or rhonchi Abdomen: soft, non-tender, non-distended, +BS Extremities: 5/5 strength upper and lower extremities bilaterally; no LE edema Neuro: A&Ox3, no focal deficits  Psych: appropriate mood and  affect  Laboratory:  Recent Labs Lab 11/15/15 1830  WBC 5.9  HGB 12.4  HCT 37.3  PLT 227    Recent Labs Lab 11/15/15 1830  NA 135  K 3.7  CL 102  CO2 21*  BUN 7  CREATININE 0.60  CALCIUM 9.4  GLUCOSE 82    Imaging/Diagnostic Tests: Mr Lodema Pilot Contrast 11/15/2015 CLINICAL DATA: Vertigo. Headache for 1 week. Prior bilateral lower extremity numbness. EXAM: MRI HEAD WITHOUT AND WITH CONTRAST MRA HEAD WITHOUT CONTRAST MRA NECK WITHOUT AND WITH CONTRAST. IMPRESSION: 1. Numerous supratentorial and infratentorial brain lesions consistent with demyelinating disease/multiple sclerosis. Enhancing/restricted lesions are consistent with active demyelination. 2. Multiple cervical spinal cord lesions suspected. 3. No evidence of significant intracranial or extracranial arterial stenosis, dissection, or major branch vessel occlusion.   Marquette Saa, MD 11/18/2015, 10:46 AM PGY-1, [facility] Intern pager: [phone], text pages welcome

## 2015-11-19 LAB — GLUCOSE, CAPILLARY
GLUCOSE-CAPILLARY: 128 mg/dL — AB (ref 65–99)
GLUCOSE-CAPILLARY: 145 mg/dL — AB (ref 65–99)
GLUCOSE-CAPILLARY: 163 mg/dL — AB (ref 65–99)
GLUCOSE-CAPILLARY: 157 mg/dL — AB (ref 65–99)
Glucose-Capillary: 155 mg/dL — ABNORMAL HIGH (ref 65–99)

## 2015-11-19 NOTE — Progress Notes (Signed)
Occupational Therapy Treatment Patient Details Name: Alicia Barker MRN: [id number] DOB: 07-May-1986 Today's Date: 11/19/2015    History of present illness Alicia Barker is a 30 y.o. female presenting with blurred vision and dizziness . PMH is significant for depression. Diagnosed with multiple sclerosis.   OT comments  Pt with improved vision and therefore increased independence in ADL and ADL transfers. Will have supervision at home. Pt educated in fatigue management and energy conservation. Overall, performing at a supervision level as she continues to have dizziness with downward gaze. Pt is eager to go home. Discharge plan updated to home.  Follow Up Recommendations  No OT follow up    Equipment Recommendations  Tub/shower seat    Recommendations for Other Services      Precautions / Restrictions Precautions Precautions: Fall       Mobility Bed Mobility Overal bed mobility: Modified Independent                Transfers                      Balance     Sitting balance-Leahy Scale: Good       Standing balance-Leahy Scale: Fair                     ADL Overall ADL's : Needs assistance/impaired     Grooming: Wash/dry hands;Standing;Supervision/safety           Upper Body Dressing : Set up;Sitting   Lower Body Dressing: Supervision/safety;Sit to/from stand   Toilet Transfer: Supervision/safety;Ambulation   Toileting- Clothing Manipulation and Hygiene: Supervision/safety;Sit to/from stand   Tub/ Shower Transfer: Min guard;Ambulation;Rolling walker Tub/Shower Transfer Details (indicate cue type and reason): simulated, pt is agreeable to shower seat, will have supervision at home Functional mobility during ADLs: Supervision/safety;Rolling walker General ADL Comments: Educated pt in managing fatigue and energy conservation. Pt able to read today and use her smart phone. Pt educated in importance of saving her DME due to the nature of  her disease and possibility of needing it at a later date. Cautioned pt against attempts to lean over and retrieve items from the floor until vision issues resolve. Pt verbalizing understanding of all.      Vision                 Additional Comments: Photophobia has resolved. Pt able to gaze R. Continues to have dizziness with downward gaze.   Perception     Praxis      Cognition   Behavior During Therapy: WFL for tasks assessed/performed Overall Cognitive Status: Within Functional Limits for tasks assessed                       Extremity/Trunk Assessment               Exercises     Shoulder Instructions       General Comments      Pertinent Vitals/ Pain       Pain Assessment: Faces Faces Pain Scale: Hurts little more Pain Location: neck Pain Descriptors / Indicators: Aching Pain Intervention(s): Monitored during session;Repositioned;Heat applied  Home Living                                          Prior Functioning/Environment  Frequency       Progress Toward Goals  OT Goals(current goals can now be found in the care plan section)  Progress towards OT goals: Progressing toward goals  Acute Rehab OT Goals Patient Stated Goal: go home Potential to Achieve Goals: Good  Plan Discharge plan needs to be updated    Co-evaluation                 End of Session Equipment Utilized During Treatment: Gait belt   Activity Tolerance Patient tolerated treatment well   Patient Left in bed;with call bell/phone within reach;with nursing/sitter in room;with family/visitor present   Nurse Communication          Time: 1610-9604 OT Time Calculation (min): 17 min  Charges: OT General Charges $OT Visit: 1 Procedure OT Treatments $Self Care/Home Management : 8-22 mins  Evern Bio 11/19/2015, 3:23 PM [phone]

## 2015-11-19 NOTE — Progress Notes (Signed)
Physical Therapy Treatment Patient Details Name: Alicia Barker MRN: [id number] DOB: [birth year] Today's Date: 11/19/2015    History of Present Illness Alicia Barker is a 30 y.o. female presenting with blurred vision and dizziness . PMH is significant for depression. Diagnosed with multiple sclerosis.    PT Comments    Pt much improved this date but con't to have dizziness with downward gaze requiring RW for safe ambulation. Acute PT to con't to follow.  Follow Up Recommendations  Outpatient PT;Supervision/Assistance - 24 hour (outpt neuro PT)     Equipment Recommendations  Rolling walker with 5" wheels    Recommendations for Other Services       Precautions / Restrictions Precautions Precautions: Fall Restrictions Weight Bearing Restrictions: No    Mobility  Bed Mobility Overal bed mobility: Modified Independent             General bed mobility comments: hob elevated, use of bed rails  Transfers Overall transfer level: Needs assistance Equipment used: None Transfers: Sit to/from Stand Sit to Stand: Min guard         General transfer comment: no physical assist required but increased time and pt very guarded.  Ambulation/Gait Ambulation/Gait assistance: Min assist Ambulation Distance (Feet): 100 Feet (x2) Assistive device: None;Straight cane Gait Pattern/deviations: Step-through pattern;Decreased stride length Gait velocity: slow Gait velocity interpretation: Below normal speed for age/gender General Gait Details: began ambulating without AD. pt very slow and guarded requiring minA for stability. no overt LOB however pt remains unable to look down at floor due to onset of dizziness and lightheadedness. worked with cane as well however pt with inconsistent sequencing and ultimately prefered the RW for safe ambulation as it provided the most stabilty. PT agreed.   Stairs Stairs: Yes Stairs assistance: Min assist Stair Management: Two rails;Alternating  pattern;Step to pattern Number of Stairs: 12 General stair comments: ascending alternating, step descending due to inability to look down. pt had tos it down on stairs as became lightheaded  Wheelchair Mobility    Modified Rankin (Stroke Patients Only)       Balance Overall balance assessment: Needs assistance Sitting-balance support: No upper extremity supported Sitting balance-Leahy Scale: Good     Standing balance support: No upper extremity supported Standing balance-Leahy Scale: Fair Standing balance comment: static pt is okay however con't to require assist for amb                    Cognition Arousal/Alertness: Awake/alert Behavior During Therapy: WFL for tasks assessed/performed Overall Cognitive Status: Within Functional Limits for tasks assessed                      Exercises      General Comments        Pertinent Vitals/Pain Pain Assessment: Faces Faces Pain Scale: Hurts little more Pain Location: neck Pain Descriptors / Indicators: Aching Pain Intervention(s): Monitored during session;Repositioned;Heat applied    Home Living                      Prior Function            PT Goals (current goals can now be found in the care plan section) Acute Rehab PT Goals Patient Stated Goal: go home Progress towards PT goals: Progressing toward goals    Frequency  Min 4X/week    PT Plan Discharge plan needs to be updated    Co-evaluation  End of Session Equipment Utilized During Treatment: Gait belt Activity Tolerance: Other (comment) Patient left: in bed;with call bell/phone within reach;with family/visitor present     Time: 4098-1191 PT Time Calculation (min) (ACUTE ONLY): 28 min  Charges:  $Gait Training: 23-37 mins                    G Codes:      Marcene Brawn 11/19/2015, 3:43 PM   Lewis Shock, PT, DPT Pager #: [phone] Office #: [phone]

## 2015-11-19 NOTE — Progress Notes (Signed)
Family Medicine Teaching Service Daily Progress Note Intern Pager: [phone]  Patient name: Alicia Barker Medical record number: [id number] Date of birth: 08-29-1985 Age: 30 y.o. Gender: female  Primary Care Provider: Kathreen Cosier, MD Consultants: neuro Code Status: FULL  Pt Overview and Major Events to Date:  4/5 - admitted to FPTS; solumedrol started  Assessment and Plan: Alicia Barker is a 30 y.o. female presenting with blurred vision and dizziness . PMH is significant for depression.  Multiple sclerosis: Newly diagnosed in ED on admission given history and imaging. Likely responsible for blurred vision, dizziness, and other symptoms patient has reported over past 8 years. MRI obtained in ED consistent with MS, with multiple demyelinating lesions. Improved with steroids.  - Neuro following - appreciate recs - Cont IV solumedrol  q6 (day 5/5) - Discussed with Neuro this morning. Improved to the point of being safe for d/c pending PT evaluation regarding outpatient therapy.  - She needs to see an MS specialist as an outpatient. Will f/u with Neuro and refer from there.  - F/U with our clinic as a new patient.   Headache: Present for past few days after onset of blurred vision. Persistent but responding to Toradol. - Toradol PRN - Continue to monitor   FEN/GI: regular diet, Miralax Prophylaxis: Lovenox  Disposition: Home tonight after dose of solumedrol.   Subjective:  Doing much better, no complaints, able to ambulate without cane this am. Feels 100% better. She is currently "homeless" with her husband but upon further explanation is living with her Alicia Barker, so has a place to go home to, and she has transportation.   Objective: Temp:  [98.2 F (36.8 C)-98.5 F (36.9 C)] 98.5 F (36.9 C) (04/09 0529) Pulse Rate:  [44-85] 44 (04/09 0529) Resp:  [16-20] 18 (04/09 0529) BP: (118-133)/(63-74) 118/63 mmHg (04/09 0529) SpO2:  [100 %] 100 % (04/09 0529) Physical  Exam: General:resting comfortably in bed in NAD HEENT: PERRLA, EOMI. No nystagmus noted.  Cardiovascular: RRR, No MGR.  Respiratory: CTAB Abdomen: soft, non-tender, non-distended, +BS Extremities: 5/5 strength upper and lower extremities bilaterally; no LE edema Neuro: A&Ox3, no focal deficits  Psych: appropriate mood and affect  Laboratory:  Recent Labs Lab 11/15/15 1830  WBC 5.9  HGB 12.4  HCT 37.3  PLT 227    Recent Labs Lab 11/15/15 1830  NA 135  K 3.7  CL 102  CO2 21*  BUN 7  CREATININE 0.60  CALCIUM 9.4  GLUCOSE 82    Imaging/Diagnostic Tests: Mr Lodema Pilot Contrast 11/15/2015 CLINICAL DATA: Vertigo. Headache for 1 week. Prior bilateral lower extremity numbness. EXAM: MRI HEAD WITHOUT AND WITH CONTRAST MRA HEAD WITHOUT CONTRAST MRA NECK WITHOUT AND WITH CONTRAST. IMPRESSION: 1. Numerous supratentorial and infratentorial brain lesions consistent with demyelinating disease/multiple sclerosis. Enhancing/restricted lesions are consistent with active demyelination. 2. Multiple cervical spinal cord lesions suspected. 3. No evidence of significant intracranial or extracranial arterial stenosis, dissection, or major branch vessel occlusion.   Yolande Jolly, MD 11/19/2015, 8:51 AM PGY-2, [facility] FPTS Intern pager: [phone], text pages welcome

## 2015-11-20 LAB — GLUCOSE, CAPILLARY
GLUCOSE-CAPILLARY: 123 mg/dL — AB (ref 65–99)
GLUCOSE-CAPILLARY: 127 mg/dL — AB (ref 65–99)

## 2015-11-20 NOTE — Discharge Instructions (Signed)
It is very important to attend both of your follow-up appointment (with Dr. Pollie Meyer at the [facility] and with Dr. Epimenio Foot and [facility]).   Please continue to take any medications as you were prior to admission.

## 2015-11-20 NOTE — Progress Notes (Signed)
Physical Therapy Treatment Patient Details Name: Alicia Barker MRN: [id number] DOB: 1986/02/18 Today's Date: 11/20/2015    History of Present Illness MALEEHA HALLS is a 30 y.o. female presenting with blurred vision and dizziness . PMH is significant for depression. Diagnosed with multiple sclerosis.    PT Comments    Pt progressing towards physical therapy goals. Was able to perform transfers and ambulation with rollator without assistance. Pt reports no onset of dizziness or lightheadedness with head turns R/L or up/down. Pt anticipates d/c home this afternoon.   Follow Up Recommendations  Outpatient PT;Supervision/Assistance - 24 hour (outpt neuro PT)     Equipment Recommendations  Other (comment) (Rollator and shower chair)    Recommendations for Other Services       Precautions / Restrictions Precautions Precautions: Fall Restrictions Weight Bearing Restrictions: No    Mobility  Bed Mobility Overal bed mobility: Modified Independent             General bed mobility comments: hob elevated, no use of rails  Transfers Overall transfer level: Modified independent Equipment used: 4-wheeled walker Transfers: Sit to/from Stand           General transfer comment: No physical assist required. No unsteadiness noted.   Ambulation/Gait Ambulation/Gait assistance: Modified independent (Device/Increase time) Ambulation Distance (Feet): 200 Feet Assistive device: 4-wheeled walker Gait Pattern/deviations: Step-through pattern;Decreased stride length Gait velocity: Decreased Gait velocity interpretation: Below normal speed for age/gender General Gait Details: Pt was able to ambulate well with the rollator for support. No assist required. She was able to complete both R/L and up/down head turns and reports no onset of dizziness during gait training.    Stairs         General stair comments: Stair training deferred as pt reports that she has arranged for  another place to stay which has no stairs to enter and is one level.   Wheelchair Mobility    Modified Rankin (Stroke Patients Only)       Balance Overall balance assessment: Needs assistance Sitting-balance support: Feet supported;No upper extremity supported Sitting balance-Leahy Scale: Good     Standing balance support: No upper extremity supported Standing balance-Leahy Scale: Fair                      Cognition Arousal/Alertness: Awake/alert Behavior During Therapy: WFL for tasks assessed/performed Overall Cognitive Status: Within Functional Limits for tasks assessed                      Exercises      General Comments        Pertinent Vitals/Pain Pain Assessment: Faces Faces Pain Scale: No hurt    Home Living                      Prior Function            PT Goals (current goals can now be found in the care plan section) Acute Rehab PT Goals Patient Stated Goal: go home today PT Goal Formulation: With patient/family Time For Goal Achievement: 12/01/15 Potential to Achieve Goals: Good Progress towards PT goals: Progressing toward goals    Frequency  Min 4X/week    PT Plan Discharge plan needs to be updated    Co-evaluation             End of Session Equipment Utilized During Treatment: Gait belt Activity Tolerance: Patient tolerated treatment well Patient left: in bed;with call bell/phone  within reach;with family/visitor present     Time: 4967-5916 PT Time Calculation (min) (ACUTE ONLY): 17 min  Charges:  $Gait Training: 8-22 mins                    G Codes:      Conni Slipper Dec 20, 2015, 2:19 PM   Conni Slipper, PT, DPT Acute Rehabilitation Services Pager: [phone]

## 2015-11-20 NOTE — Progress Notes (Signed)
Interval History:                                                                                                                      Alicia Barker is an 30 y.o. female patient with  MS. On 5th day of iv solumedrol today.   Nystagmus resolved. No new neuro sx.  Gait improved.    Past Medical History: Past Medical History  Diagnosis Date  . SVD (spontaneous vaginal delivery)     x 5  . Depression     no meds currently    Past Surgical History  Procedure Laterality Date  . Right hand surgery    . Laparoscopic tubal ligation Bilateral 01/06/2013    Procedure: LAPAROSCOPIC TUBAL LIGATION;  Surgeon: Kathreen Cosier, MD;  Location: [facility];  Service: Gynecology;  Laterality: Bilateral;    Family History: Family History  Problem Relation Age of Onset  . Diabetes Mother   . Heart disease Mother   . Diabetes Father     Social History:   reports that she has been smoking Cigarettes.  She has been smoking about 0.10 packs per day. She has never used smokeless tobacco. She reports that she does not drink alcohol or use illicit drugs.  Allergies:  Allergies  Allergen Reactions  . Penicillins Anaphylaxis, Shortness Of Breath and Swelling    Has patient had a PCN reaction causing immediate rash, facial/tongue/throat swelling, SOB or lightheadedness with hypotension: YES Has patient had a PCN reaction causing severe rash involving mucus membranes or skin necrosis: NO Has patient had a PCN reaction that required hospitalization NO Has patient had a PCN reaction occurring within the last 10 years: NO If all of the above answers are "NO", then may proceed with Cephalosporin use.     Medications:                                                                                                                         Current facility-administered medications:  .  acetaminophen (TYLENOL) tablet 650 mg, 650 mg, Oral, Q6H PRN, Marquette Saa, MD, 650 mg at 11/19/15 0017 .   diphenhydrAMINE (BENADRYL) capsule 25 mg, 25 mg, Oral, Q6H PRN, Marquette Saa, MD, 25 mg at 11/17/15 1158 .  enoxaparin (LOVENOX) injection 40 mg, 40 mg, Subcutaneous, Daily, Marquette Saa, MD, 40 mg at 11/19/15 0940 .  ibuprofen (ADVIL,MOTRIN) tablet 600 mg, 600 mg, Oral, Q6H PRN, Marquette Saa, MD, 600 mg  at 11/19/15 2007 .  methylPREDNISolone sodium succinate (SOLU-MEDROL) 250 mg in sodium chloride 0.9 % 50 mL IVPB, 250 mg, Intravenous, Q6H, Jaylun Fleener Daniel Nones, MD, 250 mg at 11/20/15 1610 .  ondansetron (ZOFRAN-ODT) disintegrating tablet 4 mg, 4 mg, Oral, Q8H PRN, Marquette Saa, MD, 4 mg at 11/20/15 0515 .  pantoprazole (PROTONIX) EC tablet 40 mg, 40 mg, Oral, Daily, Orilla Templeman Daniel Nones, MD, 40 mg at 11/19/15 0940 .  polyethylene glycol (MIRALAX / GLYCOLAX) packet 17 g, 17 g, Oral, Daily PRN, Marquette Saa, MD, 17 g at 11/17/15 1657 .  senna (SENOKOT) tablet 8.6 mg, 1 tablet, Oral, Daily, Marquette Saa, MD, 8.6 mg at 11/19/15 0940   Neurologic Examination:                                                                                                     Today's Vitals   11/19/15 2214 11/20/15 0000 11/20/15 0146 11/20/15 0400  BP: 108/59  110/62   Pulse: 49  48   Temp: 98.4 F (36.9 C)  98.3 F (36.8 C)   TempSrc: Oral  Oral   Resp: 18  16   Height:      Weight:      SpO2: 95%  96%   PainSc: Asleep Asleep Asleep Asleep    Evaluation of higher integrative functions including: Level of alertness: Alert,  Oriented to time, place and person Speech: fluent, no evidence of dysarthria or aphasia noted.  Test the following cranial nerves: 2-12 grossly intact, no nystagmus.  Motor examination:   full 5/5 motor strength in all 4 extremities Test coordination: Normal finger nose testing, with no evidence of limb appendicular ataxia or abnormal involuntary movements or tremors noted.  Gait: wide based mildly  ataxic gait   Lab Results: Basic Metabolic Panel:  Recent Labs Lab 11/15/15 1830  NA 135  K 3.7  CL 102  CO2 21*  GLUCOSE 82  BUN 7  CREATININE 0.60  CALCIUM 9.4    Liver Function Tests: No results for input(s): AST, ALT, ALKPHOS, BILITOT, PROT, ALBUMIN in the last 168 hours. No results for input(s): LIPASE, AMYLASE in the last 168 hours. No results for input(s): AMMONIA in the last 168 hours.  CBC:  Recent Labs Lab 11/15/15 1830  WBC 5.9  NEUTROABS 3.4  HGB 12.4  HCT 37.3  MCV 86.5  PLT 227    Cardiac Enzymes: No results for input(s): CKTOTAL, CKMB, CKMBINDEX, TROPONINI in the last 168 hours.  Lipid Panel: No results for input(s): CHOL, TRIG, HDL, CHOLHDL, VLDL, LDLCALC in the last 168 hours.  CBG:  Recent Labs Lab 11/18/15 2132 11/19/15 0640 11/19/15 1131 11/19/15 1615 11/19/15 2212  GLUCAP 144* 128* 145* 163* 157*    Microbiology: Results for orders placed or performed during the hospital encounter of 01/01/13  Surgical pcr screen     Status: None   Collection Time: 01/01/13 10:51 AM  Result Value Ref Range Status   MRSA, PCR NEGATIVE NEGATIVE Final   Staphylococcus aureus NEGATIVE NEGATIVE Final    Comment:  The Xpert SA Assay (FDA approved for NASAL specimens in patients over 69 years of age), is one component of a comprehensive surveillance program.  Test performance has been validated by [employer] for patients greater than or equal to 80 year old. It is not intended to diagnose infection nor to guide or monitor treatment.    Imaging: Mr Cervical Spine W Wo Contrast  11/17/2015  CLINICAL DATA:  Multiple sclerosis. EXAM: MRI CERVICAL SPINE WITHOUT AND WITH CONTRAST TECHNIQUE: Multiplanar and multiecho pulse sequences of the cervical spine, to include the craniocervical junction and cervicothoracic junction, were obtained according to standard protocol without and with intravenous contrast. CONTRAST:  20 mL MultiHance  COMPARISON:  Brain MRI 11/15/2015 FINDINGS: Vertebral alignment is normal. Vertebral body heights are preserved. There is diffuse cervical disc desiccation with at most mild disc space height loss. Mild disc bulging is present from C3-4 to C6-7 without significant spinal canal or neural foraminal stenosis. No vertebral marrow edema is seen. Paraspinal soft tissues are unremarkable. Small T2 hyperintense lesions are present in the pons and upper medulla has seen on the recent brain MRI. The pontine lesion does in fact demonstrate low-level enhancement which is more conspicuous on the current examination than on the prior brain MRI. There are small areas of patchy T2 hyperintensity dorsally in the cord predominantly right of midline at C2 and C3 with faint enhancement at C2. The spinal cord is normal in caliber. IMPRESSION: Small cord lesions at C2 and C3 consistent with demyelinating disease. Faint enhancement at C2. Electronically Signed   By: Sebastian Ache M.D.   On: 11/17/2015 13:44   Mr Thoracic Spine W Wo Contrast  11/17/2015  CLINICAL DATA:  Demyelinating disease.  Brain lesions on recent MRI. EXAM: MRI THORACIC SPINE WITHOUT AND WITH CONTRAST TECHNIQUE: Multiplanar and multiecho pulse sequences of the thoracic spine were obtained without and with intravenous contrast. CONTRAST:  69mL MULTIHANCE GADOBENATE DIMEGLUMINE 529 MG/ML IV SOLN COMPARISON:  None. FINDINGS: Vertebral alignment is normal paravertebral body heights and intervertebral disc space heights are preserved. No significant vertebral marrow edema is seen. No disc herniation, spinal stenosis, or neural foraminal stenosis is identified. There are trace bilateral pleural effusions. The cervical scratched of the thoracic spinal cord is normal in caliber. The conus medullaris terminates at T12-L1. There is an approximately 5 mm focus of T2 hyperintensity in the central/ dorsal cord at T3-4 with mild enhancement which is most apparent on axial images.  An additional punctate T2 lesion is questioned at T7-8 on the sagittal STIR sequence with possible subtle T2 hyperintensity on axial images and no evidence of enhancement. IMPRESSION: 1. 5 mm mildly enhancing lesion in the spinal cord at T3-4 consistent with demyelinating disease. 2. Questionable nonenhancing lesion at T7-8. Electronically Signed   By: Sebastian Ache M.D.   On: 11/17/2015 13:36    Assessment and plan:   ASHALA HINZ is an 30 y.o. female patient with new MS diagnosis, will complete 5 days of iv solumedrol today. Her neurological sx have significantly improved, no nystagmus, no motor weakness, no appendicular ataxia, has ,mild residual gait ataxia.   PT evaluation today suggested possible out pt PT 4 x / week.   recommend f/u with out pt neurologist to start MS rx , possibly with Tysabri.   no further recs. Will sign off.

## 2015-11-20 NOTE — Care Management Note (Addendum)
Case Management Note  Patient Details  Name: Alicia Barker MRN: [id number] Date of Birth: [birth year]  Subjective/Objective:                    Action/Plan: Orders for HHPT. Patient is unable to receive HHPT with her Medicaid and current diagnosis. CM spoke with [facility] and the patient will at least get an evaluation visit with Medicaid and could potentially have some treatment visits. CM called and spoke with the MD and she was in agreement with outpatient therapy. CM spoke with the patient and informed her that HHPT would not be covered with Medicaid but she could have at least one visit with [facility] and she was in agreement to go to [facility]. She also states she has transportation to be able to attend outpatient therapy. Orders placed in EPIC and information placed on the AVS. Bedside RN updated.   Addendum: Orders placed for rolling walker with seat. Jermaine with [employer] notified and will deliver the equipment to the room.   Expected Discharge Date:                  Expected Discharge Plan:  Home/Self Care  In-House Referral:     Discharge planning Services  CM Consult  Post Acute Care Choice:    Choice offered to:     DME Arranged:    DME Agency:     HH Arranged:    HH Agency:     Status of Service:  In process, will continue to follow  Medicare Important Message Given:    Date Medicare IM Given:    Medicare IM give by:    Date Additional Medicare IM Given:    Additional Medicare Important Message give by:     If discussed at Long Length of Stay Meetings, dates discussed:    Additional Comments:  Kermit Balo, RN 11/20/2015, 11:31 AM

## 2015-11-20 NOTE — Progress Notes (Signed)
Physical medicine rehabilitation consult requested chart reviewed. Recommendations have been made for outpatient therapies as patient is ambulating to the bathroom unassisted. Ambulating 100 feet minimal assistance with and without straight cane. Hold on formal rehabilitation consult at this time with recommendations discharged to home with outpatient therapies

## 2015-11-22 ENCOUNTER — Ambulatory Visit: Payer: Medicaid Other | Admitting: Physical Therapy

## 2015-11-22 ENCOUNTER — Emergency Department (HOSPITAL_COMMUNITY)
Admission: EM | Admit: 2015-11-22 | Discharge: 2015-11-22 | Disposition: A | Discharge status: Home / Self Care | Payer: Medicaid Other | Attending: Emergency Medicine | Admitting: Emergency Medicine

## 2015-11-22 ENCOUNTER — Encounter (HOSPITAL_COMMUNITY): Payer: Self-pay | Admitting: Family Medicine

## 2015-11-22 ENCOUNTER — Emergency Department (HOSPITAL_COMMUNITY): Payer: Medicaid Other

## 2015-11-22 DIAGNOSIS — R1013 Epigastric pain: Secondary | ICD-10-CM | POA: Diagnosis not present

## 2015-11-22 DIAGNOSIS — R079 Chest pain, unspecified: Secondary | ICD-10-CM | POA: Diagnosis present

## 2015-11-22 DIAGNOSIS — Z79899 Other long term (current) drug therapy: Secondary | ICD-10-CM | POA: Diagnosis not present

## 2015-11-22 DIAGNOSIS — Z8659 Personal history of other mental and behavioral disorders: Secondary | ICD-10-CM | POA: Diagnosis not present

## 2015-11-22 DIAGNOSIS — R51 Headache: Secondary | ICD-10-CM | POA: Diagnosis not present

## 2015-11-22 DIAGNOSIS — J029 Acute pharyngitis, unspecified: Secondary | ICD-10-CM | POA: Diagnosis not present

## 2015-11-22 DIAGNOSIS — Z880 Allergy status to penicillin: Secondary | ICD-10-CM | POA: Insufficient documentation

## 2015-11-22 DIAGNOSIS — F17210 Nicotine dependence, cigarettes, uncomplicated: Secondary | ICD-10-CM | POA: Diagnosis not present

## 2015-11-22 DIAGNOSIS — R197 Diarrhea, unspecified: Secondary | ICD-10-CM | POA: Insufficient documentation

## 2015-11-22 DIAGNOSIS — R110 Nausea: Secondary | ICD-10-CM | POA: Diagnosis not present

## 2015-11-22 DIAGNOSIS — R0789 Other chest pain: Secondary | ICD-10-CM

## 2015-11-22 HISTORY — DX: Multiple sclerosis: G35

## 2015-11-22 LAB — BASIC METABOLIC PANEL
Anion gap: 12 (ref 5–15)
BUN: 21 mg/dL — ABNORMAL HIGH (ref 6–20)
CO2: 24 mmol/L (ref 22–32)
CREATININE: 0.94 mg/dL (ref 0.44–1.00)
Calcium: 8.5 mg/dL — ABNORMAL LOW (ref 8.9–10.3)
Chloride: 101 mmol/L (ref 101–111)
Glucose, Bld: 131 mg/dL — ABNORMAL HIGH (ref 65–99)
Potassium: 3.1 mmol/L — ABNORMAL LOW (ref 3.5–5.1)
SODIUM: 137 mmol/L (ref 135–145)

## 2015-11-22 LAB — I-STAT TROPONIN, ED: Troponin i, poc: 0 ng/mL (ref 0.00–0.08)

## 2015-11-22 LAB — CBC
HCT: 37 % (ref 36.0–46.0)
Hemoglobin: 12.6 g/dL (ref 12.0–15.0)
MCH: 29.4 pg (ref 26.0–34.0)
MCHC: 34.1 g/dL (ref 30.0–36.0)
MCV: 86.2 fL (ref 78.0–100.0)
PLATELETS: 239 10*3/uL (ref 150–400)
RBC: 4.29 MIL/uL (ref 3.87–5.11)
RDW: 14 % (ref 11.5–15.5)
WBC: 12.6 10*3/uL — AB (ref 4.0–10.5)

## 2015-11-22 LAB — RAPID STREP SCREEN (MED CTR MEBANE ONLY): STREPTOCOCCUS, GROUP A SCREEN (DIRECT): NEGATIVE

## 2015-11-22 MED ORDER — GI COCKTAIL ~~LOC~~
30.0000 mL | Freq: Once | ORAL | Status: AC
  Administered 2015-11-22: 30 mL via ORAL
  Filled 2015-11-22: qty 30

## 2015-11-22 MED ORDER — ONDANSETRON 4 MG PO TBDP
4.0000 mg | ORAL_TABLET | Freq: Once | ORAL | Status: AC
  Administered 2015-11-22: 4 mg via ORAL
  Filled 2015-11-22: qty 1

## 2015-11-22 MED ORDER — ONDANSETRON HCL 4 MG/2ML IJ SOLN: 4.0000 mg | Freq: Once | INTRAMUSCULAR | Status: DC

## 2015-11-22 NOTE — ED Provider Notes (Signed)
CSN: [id number]     Arrival date & time 11/22/15  1152 History   First MD Initiated Contact with Patient 11/22/15 1516     Chief Complaint  Patient presents with  . Oral Swelling  . Chest Pain    (Consider location/radiation/quality/duration/timing/severity/associated sxs/prior Treatment) Patient is a 30 y.o. female presenting with chest pain. The history is provided by the patient and medical records. No language interpreter was used.  Chest Pain Associated symptoms: headache and nausea   Associated symptoms: no abdominal pain, no cough, no dizziness, no fever, no palpitations, no shortness of breath, not vomiting and no weakness    Alicia Barker is a 30 y.o. female  with a PMH of multiple sclerosis diagnosed on 11/15/15 who presents to the Emergency Department complaining of right-sided throat pain that began this morning upon waking and has improved, but not resolved, throughout the day. She feels as if something is stuck in her throat and has associated non-radiating right sided chest pain that also feels like a foreign body sensation. Admits to nausea, mild headache. Patient also admits to nonbloody diarrhea which she states is because she has been on stool softeners and laxatives since being discharged from hospital on 4/10.  Denies neck pain, shortness of breath, difficulty swallowing, cough. No medication taken for symptoms PTA. No alleviating or aggravating factors noted.   Past Medical History  Diagnosis Date  . SVD (spontaneous vaginal delivery)     x 5  . Depression     no meds currently  . MS (multiple sclerosis) [facility])    Past Surgical History  Procedure Laterality Date  . Right hand surgery    . Laparoscopic tubal ligation Bilateral 01/06/2013    Procedure: LAPAROSCOPIC TUBAL LIGATION;  Surgeon: Kathreen Cosier, MD;  Location: [facility];  Service: Gynecology;  Laterality: Bilateral;   Family History  Problem Relation Age of Onset  . Diabetes Mother   . Heart disease  Mother   . Diabetes Father    Social History  Substance Use Topics  . Smoking status: Current Some Day Smoker -- 0.10 packs/day    Types: Cigarettes  . Smokeless tobacco: Never Used  . Alcohol Use: No   OB History    Gravida Para Term Preterm AB TAB SAB Ectopic Multiple Living   5 5 4 1  0 0 0 0 0 5     Review of Systems  Constitutional: Negative for fever.  HENT: Positive for sore throat. Negative for congestion.   Eyes: Negative for visual disturbance.  Respiratory: Negative for cough, shortness of breath and wheezing.   Cardiovascular: Positive for chest pain. Negative for palpitations and leg swelling.  Gastrointestinal: Positive for nausea. Negative for vomiting and abdominal pain.  Genitourinary: Negative for dysuria.  Musculoskeletal: Negative for neck pain and neck stiffness.  Skin: Negative for rash.  Neurological: Positive for headaches. Negative for dizziness and weakness.      Allergies  Penicillins  Home Medications   Prior to Admission medications   Medication Sig Start Date End Date Taking? Authorizing Provider  acetaminophen (TYLENOL) 500 MG tablet Take 500 mg by mouth every 6 (six) hours as needed for mild pain.    Yes Historical Provider, MD  meclizine (ANTIVERT) 25 MG tablet Take 1 tablet (25 mg total) by mouth 3 (three) times daily as needed for dizziness. 11/10/15   Hayden Rasmussen, NP  ondansetron (ZOFRAN) 4 MG tablet Take 1 tablet (4 mg total) by mouth every 6 (six) hours. Prn nausea  or vomiting 11/10/15   Hayden Rasmussen, NP   BP 105/61 mmHg  Pulse 88  Temp(Src) 97.1 F (36.2 C) (Oral)  Resp 16  Ht  (1.626 m)  SpO2 100%  LMP 10/31/2015 Physical Exam  Constitutional: She is oriented to person, place, and time. She appears well-developed and well-nourished.  Alert, speaking in full sentences and handling secretions well. NAD.   HENT:  Head: Normocephalic and atraumatic.  OP with erythema. Right sided tonsillar swelling without exudates. No abscess  or sublingual tenderness. Uvula midline.   Neck: Neck supple.  Full ROM without pain with no tenderness to palpation to neck spaces.   Cardiovascular: Normal rate, regular rhythm and normal heart sounds.  Exam reveals no gallop and no friction rub.   No murmur heard. Pulmonary/Chest: Effort normal and breath sounds normal. No respiratory distress. She has no wheezes. She has no rales.    Abdominal: Soft. Bowel sounds are normal. She exhibits no distension and no mass. There is tenderness (Epigastric). There is no rebound and no guarding.  Musculoskeletal: She exhibits no edema.  Lymphadenopathy:    She has cervical adenopathy (Tender right anterior cervical adenopathy. ).  Neurological: She is alert and oriented to person, place, and time.  Skin: Skin is warm and dry.  Nursing note and vitals reviewed.   ED Course  Procedures (including critical care time) Labs Review Labs Reviewed  BASIC METABOLIC PANEL - Abnormal; Notable for the following:    Potassium 3.1 (*)    Glucose, Bld 131 (*)    BUN 21 (*)    Calcium 8.5 (*)    All other components within normal limits  CBC - Abnormal; Notable for the following:    WBC 12.6 (*)    All other components within normal limits  RAPID STREP SCREEN (NOT AT [facility])  CULTURE, GROUP A STREP ([facility])  I-STAT TROPOININ, ED    Imaging Review Dg Chest 2 View  11/22/2015  CLINICAL DATA:  Throat swelling, feels like something is stuck in her throat, chest pain, history smoking, multiple sclerosis EXAM: CHEST  2 VIEW COMPARISON:  10/14/2015 FINDINGS: Normal heart size, mediastinal contours, and pulmonary vascularity. Lungs clear. No pleural effusion or pneumothorax. Bones unremarkable. IMPRESSION: No acute abnormalities. Electronically Signed   By: Ulyses Southward M.D.   On: 11/22/2015 13:03   I have personally reviewed and evaluated these images and lab results as part of my medical decision-making.   EKG Interpretation   Date/Time:  Wednesday November 22 2015 11:58:55 EDT Ventricular Rate:  71 PR Interval:  110 QRS Duration: 82 QT Interval:  410 QTC Calculation: 445 R Axis:   29 Text Interpretation:  Sinus rhythm with short PR Nonspecific T wave  abnormality Abnormal ECG Confirmed by MESNER MD, Barbara Cower [phone]) on  11/22/2015 4:51:43 PM      MDM   Final diagnoses:  Sore throat  Atypical chest pain   Breahna T Dancy presents with right sided throat pain and foreign body sensation of throat and right chest. On exam, patient is well-appearing, speaking in full sentences and handling secretions well. Oxygen at 100% on room air. Oropharynx with erythema and right-sided tonsillar hypertrophy. There is no sign of abscess, no tenderness of neck, and uvula is midline. TTP of epigastrium and right chest.   Labs: Troponin 0.00, CBC with white count of 12.6, BMP reviewed. Rapid strep negative. EKG reviewed.  Imaging: CXR with no acute abnormalities  Therapeutics: GI cocktail, zofran  4:45 PM - Patient  re-evaluated and feels improved. Chest pain and nausea resolved.   Patient has an appointment with her primary care provider tomorrow morning at 10:30 AM. She is aware of this appointment and agrees that she will be following up. Patient is to be discharged with recommendation to keep appointment with PCP and inform them of today's hospital visit. Patient's symptoms unlikely to be of CAD etiology. Labs and imaging reviewed again prior to dc. CXR and ECG with no acute abnormalities, and neg troponin. Pt has been advised return to the ED if develop any exertional chest pain- strict return precautions discussed and all questions answered. Patient appears reliable for follow up and is agreeable to plan as dictated above.   [facility] Kardell Virgil, PA-C 11/22/15 1659  Marily Memos, MD 11/25/15 [phone]

## 2015-11-22 NOTE — Discharge Instructions (Signed)
Please keep your scheduled appointment with her primary care provider at 10:30 tomorrow morning. Return to the ER for any new or worsening symptoms, any additional concerns.

## 2015-11-22 NOTE — ED Notes (Addendum)
Pt here for  Sensation of throat swelling. sts the sensation she has something stuck in throat. sts causing chest pain. sts pain into back. Recent dx of MS

## 2015-11-23 ENCOUNTER — Encounter: Payer: Self-pay | Admitting: Family Medicine

## 2015-11-23 ENCOUNTER — Ambulatory Visit (INDEPENDENT_AMBULATORY_CARE_PROVIDER_SITE_OTHER): Payer: Medicaid Other | Admitting: Family Medicine

## 2015-11-23 VITALS — BP 99/56 | HR 63 | Temp 97.9°F | Wt 198.0 lb

## 2015-11-23 DIAGNOSIS — G35 Multiple sclerosis: Secondary | ICD-10-CM

## 2015-11-23 DIAGNOSIS — R079 Chest pain, unspecified: Secondary | ICD-10-CM | POA: Diagnosis not present

## 2015-11-23 MED ORDER — RANITIDINE HCL 150 MG PO TABS: 150.0000 mg | ORAL_TABLET | Freq: Two times a day (BID) | ORAL | Status: DC

## 2015-11-23 MED ORDER — OMEPRAZOLE 20 MG PO CPDR: 20.0000 mg | DELAYED_RELEASE_CAPSULE | Freq: Every day | ORAL | Status: DC

## 2015-11-23 MED ORDER — TRAMADOL HCL 50 MG PO TABS: 50.0000 mg | ORAL_TABLET | Freq: Three times a day (TID) | ORAL | Status: DC | PRN

## 2015-11-23 NOTE — Patient Instructions (Addendum)
Schedule a new patient appointment with Dr. Natale Milch to follow up on the issues you've had, visit in the next 1-2 months.  Get new patient paperwork from the front desk  Keep neurology & physical therapy appointments  Tramadol for headache - use caution as it might make you sleepy  For reflux, start omeprazole and ranitidine Sent these in to your pharmacy  Be well, Dr. Pollie Meyer

## 2015-11-25 DIAGNOSIS — R079 Chest pain, unspecified: Secondary | ICD-10-CM | POA: Insufficient documentation

## 2015-11-25 LAB — CULTURE, GROUP A STREP (THRC)

## 2015-11-25 NOTE — Assessment & Plan Note (Addendum)
Encouraged follow up with neurology as scheduled Having headaches, unclear if this is secondary to MS  Will rx tramadol for headache relief Keep physical therapy appointment  Suspect swallowing issues related to MS - neuro follow up will be essential. Currently protecting airway and tolerating PO intake. May benefit from SLP eval.  She will follow up here at [facility] in a month or so to establish care with Dr. Natale Milch, who is the physician who followed her most closely in the hospital

## 2015-11-25 NOTE — Assessment & Plan Note (Addendum)
Suspect acid reflux. At risk given recent high dose steroid administration. ED workup unremarkable. Improved with use of GI cocktail Plan; - start PPI & H2 blocker - follow up here in 1 month - discussed ED return precautions

## 2015-11-25 NOTE — Progress Notes (Signed)
  HPI:  Alicia Barker presents for hospital follow up. Patient was hospitalized from 11/15/15 to 11/20/15 with blurred vision and dizziness, found to have multiple demyelinating lesions consistent with multiple sclerosis. Treated with high dose IV steroids with some improvement in symptoms.    Pt now reports she's overall doing okay. She went back to the ED on 4/12 with chest pain. Had thorough workup that did not reveal etiology. Symptoms improved with GI cocktail and zofran. Still has chest pain sometimes. Describes as burning, indigestion type pain. Worse with laying down, not with exertion.  Notes she's had some trouble swallowing. No drooling or inability to take PO. Has used straw to help her with swallowing. Mild R lymphadenopathy noted during ED visit. Rapid strep was negative.  Patient also complains of ongoing headaches since discharge from hospital. Tylenol/nsaids are not helping very much. Has upcoming neurology appointment. Using walker for ambulation. Denies history of seizure in the past. Had to reschedule her physical therapy appointment due to having chest pain and going back to the ED.   ROS: See HPI.  PMFSH: multiple sclerosis  PHYSICAL EXAM: BP 99/56 mmHg  Pulse 63  Temp(Src) 97.9 F (36.6 C) (Oral)  Wt 198 lb (89.812 kg)  LMP 10/31/2015 Gen: NAD, pleasant, cooperative, well appearing HEENT: normocephalic, atraumatic, moist mucous membranes. Oropharynx clear and moist. Mild R anterior cervical lymphadenopathy.  Heart: regular rate and rhythm, no murmur Lungs: clear to auscultation bilaterally, normal work of breathing  Neuro: ambulates well with walker. Speech normal. Grossly nonfocal Ext: atraumatic  ASSESSMENT/PLAN:  Multiple sclerosis (HCC) Encouraged follow up with neurology as scheduled Having headaches, unclear if this is secondary to MS  Will rx tramadol for headache relief Keep physical therapy appointment  Suspect swallowing issues related to MS - neuro  follow up will be essential. Currently protecting airway and tolerating PO intake. May benefit from SLP eval.  She will follow up here at [facility] in a month or so to establish care with Dr. Natale Milch, who is the physician who followed her most closely in the hospital  Chest pain Suspect acid reflux. At risk given recent high dose steroid administration. ED workup unremarkable. Improved with use of GI cocktail Plan; - start PPI & H2 blocker - follow up here in 1 month - discussed ED return precautions   FOLLOW UP: Follow up in 1 month with Dr. Natale Milch to establish care Keep appts with neurology & physical therapy  [place] J. Pollie Meyer, MD [facility]

## 2015-11-28 ENCOUNTER — Ambulatory Visit (INDEPENDENT_AMBULATORY_CARE_PROVIDER_SITE_OTHER): Payer: Medicaid Other | Admitting: Neurology

## 2015-11-28 ENCOUNTER — Encounter: Payer: Self-pay | Admitting: Neurology

## 2015-11-28 VITALS — BP 120/82 | HR 74 | Resp 14 | Ht 64.0 in | Wt 200.0 lb

## 2015-11-28 DIAGNOSIS — H539 Unspecified visual disturbance: Secondary | ICD-10-CM | POA: Diagnosis not present

## 2015-11-28 DIAGNOSIS — G35 Multiple sclerosis: Secondary | ICD-10-CM

## 2015-11-28 DIAGNOSIS — R5382 Chronic fatigue, unspecified: Secondary | ICD-10-CM | POA: Insufficient documentation

## 2015-11-28 DIAGNOSIS — R350 Frequency of micturition: Secondary | ICD-10-CM | POA: Diagnosis not present

## 2015-11-28 DIAGNOSIS — R5383 Other fatigue: Secondary | ICD-10-CM | POA: Diagnosis not present

## 2015-11-28 DIAGNOSIS — R7989 Other specified abnormal findings of blood chemistry: Secondary | ICD-10-CM | POA: Diagnosis not present

## 2015-11-28 DIAGNOSIS — R200 Anesthesia of skin: Secondary | ICD-10-CM

## 2015-11-28 DIAGNOSIS — Z79899 Other long term (current) drug therapy: Secondary | ICD-10-CM | POA: Insufficient documentation

## 2015-11-28 DIAGNOSIS — R270 Ataxia, unspecified: Secondary | ICD-10-CM | POA: Diagnosis not present

## 2015-11-28 MED ORDER — CYCLOBENZAPRINE HCL 5 MG PO TABS: 5.0000 mg | ORAL_TABLET | Freq: Every day | ORAL | Status: DC

## 2015-11-28 NOTE — Progress Notes (Signed)
[facility]  PATIENT: Alicia Barker DOB: 12/20/1985  REFERRING DOCTOR OR PCP:  PCP is Francoise Ceo SOURCE: patient, records from hospital, MRI / lab reports, MRI images on PACS  _________________________________   HISTORICAL  CHIEF COMPLAINT:  Chief Complaint  Patient presents with  . Abnormal MRI    Alicia Barker is here for r/o MS.  Sts. at the end of March she began having numbness from the waist down, difficulty walking, h/a's, double vision bilat (right eye worse than left).  She was seen at [facility]--sts. was told MRI brain showed MS lesions.  She was admitted and received 5 days of IV steroids.  Sts. now she has pins and needles sensation in her legs with prolonged walking, is no longer seeing double but feels distance vision is worse. Feels more fatigued, winded with walking.  No LP done./fim    HISTORY OF PRESENT ILLNESS:  I had the pleasure seeing you patient, Alicia Barker, at [facility] for neurologic consultation regarding her recent diagnosis of multiple sclerosis.  In early March, 2017, she had the onset of slurred speech and leg weakness and ataxia.    A head CT was reportedly normal.    Over the next couple weeks, she had right worse than left visual acuity issues, more numbness and more clumsiness.        Her gait became unsteady.     She also noted mid back pain.    She went to the ER and was diagnosed with an inner ear problem.   She went back to ER 11/15/15 and was found to have an abnormal MRI consistent with MS.    MRI of the spine also showed additional plaques.     She received 5 days of IV Steroids and noticed an improvement with improved gait but continued numbness.   I personally reviewed the MRIs of the brain and spine performed for 12/30/2015 and 11/17/2015. The MRI of the brain with and without contrast shows multiple T2/FLAIR hyperintense foci, many in the periventricular white matter.    Another focus is the pons  (with mild enhancement, better seen on c-spine MRI).Marland Kitchen 7 foci enhanced after gadolinium administration imply more recent MS plaques.    There are at least 2 small T2 hyperintense foci within the cervical spine, one of which appears to enhance slightly.    Another enhancing focus is noted within the thoracic spine adjacent to T3-T4.   Gait/strength/sensation: She reports some difficulty with her gait. Particularly she feels off balance. She veers some as she walks. She also notes numbness that is worse on the right side where there is also tingling. Some clumsiness in her legs, worse on the right. She does not note significant weakness but has ataxia.  Bladder/bowel: Also, since March, she has had more urinary frequency and urgency. She had a couple episodes of incontinence but is doing better since the steroids. She also notes constipation.  Vision: She notes that her vision is doing better. She feels right eye slightly worse than left eye.  She does note some blurriness when she looks into the distance. She denies diplopia.  Fatigue/sleep: She does note more fatigue in the last couple months. She also has had a lot of difficulty with insomnia. In the hospital, she slept better with a sleeping pill. She has difficulty both falling asleep and staying asleep.  Mood/cognition: She notes some depression and anxiety since being diagnosed with MS and having the symptoms last month. She  also has noted that her cognitive processing speed appears to be slower.   REVIEW OF SYSTEMS: Constitutional: No fevers, chills, sweats, or change in appetite.  She reports fatigue. Eyes: mild right visual changes, double vision, eye pain Ear, nose and throat: No hearing loss, ear pain, nasal congestion, sore throat Cardiovascular: No chest pain, palpitations Respiratory: No shortness of breath at rest or with exertion.   No wheezes GastrointestinaI: No nausea, vomiting, diarrhea, abdominal pain, fecal  incontinence Genitourinary: No dysuria, urinary retention.   She has some urgency and frequency..  No nocturia. Musculoskeletal: No neck pain, back pain Integumentary: No rash, pruritus, skin lesions Neurological: as above Psychiatric: Notes mild depression > anxiety Endocrine: No palpitations, diaphoresis, change in appetite, change in weigh or increased thirst Hematologic/Lymphatic: No anemia, purpura, petechiae. Allergic/Immunologic: No itchy/runny eyes, nasal congestion, recent allergic reactions, rashes  ALLERGIES: Allergies  Allergen Reactions  . Penicillins Anaphylaxis, Shortness Of Breath and Swelling    Has patient had a PCN reaction causing immediate rash, facial/tongue/throat swelling, SOB or lightheadedness with hypotension: YES Has patient had a PCN reaction causing severe rash involving mucus membranes or skin necrosis: NO Has patient had a PCN reaction that required hospitalization NO Has patient had a PCN reaction occurring within the last 10 years: NO If all of the above answers are "NO", then may proceed with Cephalosporin use.    HOME MEDICATIONS:  Current outpatient prescriptions:  .  meclizine (ANTIVERT) 25 MG tablet, Take 1 tablet (25 mg total) by mouth 3 (three) times daily as needed for dizziness., Disp: 30 tablet, Rfl: 0 .  omeprazole (PRILOSEC) 20 MG capsule, Take 1 capsule (20 mg total) by mouth daily., Disp: 30 capsule, Rfl: 3 .  ondansetron (ZOFRAN) 4 MG tablet, Take 1 tablet (4 mg total) by mouth every 6 (six) hours. Prn nausea or vomiting, Disp: 12 tablet, Rfl: 0 .  ranitidine (ZANTAC) 150 MG tablet, Take 1 tablet (150 mg total) by mouth 2 (two) times daily., Disp: 60 tablet, Rfl: 1 .  traMADol (ULTRAM) 50 MG tablet, Take 1 tablet (50 mg total) by mouth every 8 (eight) hours as needed., Disp: 15 tablet, Rfl: 0 .  [DISCONTINUED] fluticasone (FLONASE) 50 MCG/ACT nasal spray, Place 1-2 sprays into both nostrils at bedtime. (Patient not taking: Reported on  10/14/2015), Disp: 16 g, Rfl: 0 .  [DISCONTINUED] ipratropium (ATROVENT) 0.06 % nasal spray, Place 2 sprays into both nostrils 4 (four) times daily. (Patient not taking: Reported on 10/14/2015), Disp: 15 mL, Rfl: 12  PAST MEDICAL HISTORY: Past Medical History  Diagnosis Date  . SVD (spontaneous vaginal delivery)     x 5  . Depression     no meds currently  . MS (multiple sclerosis) (HCC)     PAST SURGICAL HISTORY: Past Surgical History  Procedure Laterality Date  . Right hand surgery    . Laparoscopic tubal ligation Bilateral 01/06/2013    Procedure: LAPAROSCOPIC TUBAL LIGATION;  Surgeon: Kathreen Cosier, MD;  Location: [facility];  Service: Gynecology;  Laterality: Bilateral;    FAMILY HISTORY: Family History  Problem Relation Age of Onset  . Diabetes Mother   . Heart disease Mother   . Diabetes Father   . Multiple sclerosis Cousin     SOCIAL HISTORY:  Social History   Social History  . Marital Status: Married    Spouse Name: N/A  . Number of Children: N/A  . Years of Education: N/A   Occupational History  . Not on file.  Social History Main Topics  . Smoking status: Former Smoker -- 0.10 packs/day    Types: Cigarettes  . Smokeless tobacco: Never Used  . Alcohol Use: No  . Drug Use: No  . Sexual Activity: Yes    Birth Control/ Protection: None   Other Topics Concern  . Not on file   Social History Narrative     PHYSICAL EXAM  Filed Vitals:   11/28/15 1023  BP: 120/82  Pulse: 74  Resp: 14  Height: 5\' 4"  (1.626 m)  Weight: 200 lb (90.719 kg)    Body mass index is 34.31 kg/(m^2).   General: The patient is well-developed and well-nourished and in no acute distress  Eyes:  Funduscopic exam shows normal optic discs and retinal vessels.  Neck: The neck is supple, no carotid bruits are noted.  The neck is nontender.  Cardiovascular: The heart has a regular rate and rhythm with a normal S1 and S2. There were no murmurs, gallops or rubs. Lungs are  clear to auscultation.  Skin: Extremities are without significant edema.  Musculoskeletal:  Back is nontender  Neurologic Exam  Mental status: The patient is alert and oriented x 3 at the time of the examination. The patient has apparent normal recent and remote memory, with an apparently normal attention span and concentration ability.   Speech is normal.  Cranial nerves: Extraocular movements are full. She had a 1+ right APD reduced color vision out of the right eye. Visual fields are full.  Facial symmetry is present. There is good facial sensation to soft touch bilaterally.Facial strength is normal.  Trapezius and sternocleidomastoid strength is normal. No dysarthria is noted.  The tongue is midline, and the patient has symmetric elevation of the soft palate. No obvious hearing deficits are noted.  Motor:  Muscle bulk is normal.   Tone is normal. Strength is  5 / 5 in all 4 extremities.   Sensory: Sensory testing is intact to pinprick, soft touch and vibration sensation in all 4 extremities.  Coordination: Cerebellar testing reveals good finger-nose-finger and heel-to-shin bilaterally.  Gait and station: Station is normal.   Gait is normal. Tandem gait is normal. Romberg is negative.   Reflexes: Deep tendon reflexes are symmetric and normal bilaterally.   Plantar responses are flexor.    DIAGNOSTIC DATA (LABS, IMAGING, TESTING) - I reviewed patient records, labs, notes, testing and imaging myself where available.  Lab Results  Component Value Date   WBC 12.6* 11/22/2015   HGB 12.6 11/22/2015   HCT 37.0 11/22/2015   MCV 86.2 11/22/2015   PLT 239 11/22/2015      Component Value Date/Time   NA 137 11/22/2015 1211   K 3.1* 11/22/2015 1211   CL 101 11/22/2015 1211   CO2 24 11/22/2015 1211   GLUCOSE 131* 11/22/2015 1211   BUN 21* 11/22/2015 1211   CREATININE 0.94 11/22/2015 1211   CALCIUM 8.5* 11/22/2015 1211   PROT 6.0* 10/14/2015 1432   ALBUMIN 3.3* 10/14/2015 1432    AST 20 10/14/2015 1432   ALT 20 10/14/2015 1432   ALKPHOS 49 10/14/2015 1432   BILITOT 0.4 10/14/2015 1432   GFRNONAA >60 11/22/2015 1211   GFRAA >60 11/22/2015 1211       ASSESSMENT AND PLAN  Multiple sclerosis (HCC) - Plan: CBC with Differential/Platelet, Stratify JCV Ab (w/ Index) w/ Rflx, Hepatic Function Panel, VITAMIN D 25 Hydroxy (Vit-D Deficiency, Fractures)  Numbness  Ataxia  Visual disturbance  Other fatigue  Urinary frequency  High risk  medication use - Plan: CBC with Differential/Platelet, Stratify JCV Ab (w/ Index) w/ Rflx, Hepatic Function Panel, VITAMIN D 25 Hydroxy (Vit-D Deficiency, Fractures)  Low serum vitamin D - Plan: VITAMIN D 25 Hydroxy (Vit-D Deficiency, Fractures)    In summary, Alicia Barker is a 30 year old woman recently diagnosed with multiple sclerosis who has done better since receiving 5 days of IV steroids. She appears to have a fair level of aggressiveness with multiple enhancing lesion including 2 in the spinal cord.. We discussed therapeutic options. She has some concerns about medication safety but does want to be on a fairly efficacious medication. When we discussed options, she was most interested in [place]. I will have her sign and an enrollment form and we will get her started as soon as possible. We discussed that if she has any exacerbations while on Tecfidera or if an MRI shows continued changes that we will need to reconsider a different medication. We also discussed the low risk of PML with about 5 cases out of 200,000 patient's. She understands that she needs to get her CBC with lymphocytes checked every 6 months that may help [place] reduce her risk.   To help with her nighttime spasms in her insomnia, I added Flexeril 5 mg at bedtime.  She will return to see me in 3 months or sooner if there are new or worsening neurologic symptoms. We discussed symptoms of  possible exacerbations.   Richard A. Epimenio Foot, MD, PhD 11/28/2015, 10:26  AM Certified in Neurology, Clinical Neurophysiology, Sleep Medicine, Pain Medicine and Neuroimaging  [facility] [address] [place], [place] [address] [phone]

## 2015-11-29 ENCOUNTER — Telehealth: Payer: Self-pay | Admitting: *Deleted

## 2015-11-29 DIAGNOSIS — E559 Vitamin D deficiency, unspecified: Secondary | ICD-10-CM

## 2015-11-29 LAB — CBC WITH DIFFERENTIAL/PLATELET
BASOS ABS: 0 10*3/uL (ref 0.0–0.2)
Basos: 0 %
EOS (ABSOLUTE): 0.2 10*3/uL (ref 0.0–0.4)
Eos: 3 %
Hematocrit: 35.9 % (ref 34.0–46.6)
Hemoglobin: 11.6 g/dL (ref 11.1–15.9)
IMMATURE GRANS (ABS): 0.3 10*3/uL — AB (ref 0.0–0.1)
IMMATURE GRANULOCYTES: 4 %
LYMPHS: 20 %
Lymphocytes Absolute: 1.4 10*3/uL (ref 0.7–3.1)
MCH: 29.4 pg (ref 26.6–33.0)
MCHC: 32.3 g/dL (ref 31.5–35.7)
MCV: 91 fL (ref 79–97)
Monocytes Absolute: 0.5 10*3/uL (ref 0.1–0.9)
Monocytes: 7 %
NEUTROS ABS: 4.5 10*3/uL (ref 1.4–7.0)
NEUTROS PCT: 66 %
PLATELETS: 215 10*3/uL (ref 150–379)
RBC: 3.94 x10E6/uL (ref 3.77–5.28)
RDW: 15.6 % — ABNORMAL HIGH (ref 12.3–15.4)
WBC: 6.8 10*3/uL (ref 3.4–10.8)

## 2015-11-29 LAB — HEPATIC FUNCTION PANEL
ALT: 36 IU/L — ABNORMAL HIGH (ref 0–32)
AST: 17 IU/L (ref 0–40)
Albumin: 3.7 g/dL (ref 3.5–5.5)
Alkaline Phosphatase: 62 IU/L (ref 39–117)
Bilirubin Total: 0.3 mg/dL (ref 0.0–1.2)
Bilirubin, Direct: 0.08 mg/dL (ref 0.00–0.40)
Total Protein: 6.7 g/dL (ref 6.0–8.5)

## 2015-11-29 LAB — VITAMIN D 25 HYDROXY (VIT D DEFICIENCY, FRACTURES): Vit D, 25-Hydroxy: 9.6 ng/mL — ABNORMAL LOW (ref 30.0–100.0)

## 2015-11-29 MED ORDER — VITAMIN D (ERGOCALCIFEROL) 1.25 MG (50000 UNIT) PO CAPS: 50000.0000 [IU] | ORAL_CAPSULE | ORAL | Status: DC

## 2015-11-29 NOTE — Telephone Encounter (Signed)
Ihave spoken with Alicia Barker this afternoon, and per RAS, advised that vit. d level is very low; RAS would like  her to take rx. vit. d 50,000iu weekly for 12 weeks, then otc vit. d 5,000iu daily.  She verbalized understanding of same and is agreeable.  Rx. escribed to [facility] on [address] per her request/fim

## 2015-11-29 NOTE — Telephone Encounter (Signed)
-----   Message from Asa Lente, MD sent at 11/29/2015  8:38 AM EDT ----- Vitamin D is very low lets do 50,000 units weekly 12 weeks then 5000 units OTC daily.

## 2015-12-04 ENCOUNTER — Encounter: Payer: Self-pay | Admitting: *Deleted

## 2015-12-06 ENCOUNTER — Ambulatory Visit: Payer: Medicaid Other | Attending: Family Medicine | Admitting: Rehabilitative and Restorative Service Providers"

## 2015-12-08 ENCOUNTER — Ambulatory Visit: Payer: Medicaid Other | Admitting: Neurology

## 2015-12-18 ENCOUNTER — Telehealth: Payer: Self-pay | Admitting: Neurology

## 2015-12-18 ENCOUNTER — Encounter: Payer: Self-pay | Admitting: *Deleted

## 2015-12-18 ENCOUNTER — Encounter: Payer: Self-pay | Admitting: Family Medicine

## 2015-12-18 NOTE — Telephone Encounter (Signed)
Patient called regarding medication that Dr. Epimenio Foot prescribed for her that was to be delivered to her, patient states, she has never received the medication.

## 2015-12-18 NOTE — Progress Notes (Signed)
Sheis a pt of Dr Natale Milch. I filled out [facility] request for adult wheeled walker and walker attachment seat and faxed back to [facility].

## 2015-12-18 NOTE — Telephone Encounter (Signed)
Female that ans. phone sts. Jaliyah is at work.  No other # to contact her at.  Per RAS, ok to start Tecfidera.  Tecfidera srf form faxed to [facility] fax # [phone]/fim

## 2015-12-18 NOTE — Telephone Encounter (Signed)
LMOM that per RAS, labs are ok for Tecfidera.  I have faxed srf to [employer] today.  If she has not heard from them in the next 3-4 business days, she should call me back.  She does not need to return this call unless she has questions/fim

## 2016-01-17 ENCOUNTER — Ambulatory Visit: Payer: Medicaid Other | Admitting: Internal Medicine

## 2016-01-18 ENCOUNTER — Ambulatory Visit (INDEPENDENT_AMBULATORY_CARE_PROVIDER_SITE_OTHER): Payer: Medicaid Other | Admitting: Neurology

## 2016-01-18 ENCOUNTER — Encounter: Payer: Self-pay | Admitting: Neurology

## 2016-01-18 VITALS — BP 116/84 | HR 78 | Resp 14 | Ht 64.0 in | Wt 204.5 lb

## 2016-01-18 DIAGNOSIS — M25552 Pain in left hip: Secondary | ICD-10-CM | POA: Insufficient documentation

## 2016-01-18 DIAGNOSIS — M5432 Sciatica, left side: Secondary | ICD-10-CM | POA: Insufficient documentation

## 2016-01-18 DIAGNOSIS — R200 Anesthesia of skin: Secondary | ICD-10-CM

## 2016-01-18 DIAGNOSIS — H539 Unspecified visual disturbance: Secondary | ICD-10-CM

## 2016-01-18 DIAGNOSIS — R270 Ataxia, unspecified: Secondary | ICD-10-CM

## 2016-01-18 DIAGNOSIS — G35 Multiple sclerosis: Secondary | ICD-10-CM

## 2016-01-18 DIAGNOSIS — R350 Frequency of micturition: Secondary | ICD-10-CM | POA: Diagnosis not present

## 2016-01-18 DIAGNOSIS — Z79899 Other long term (current) drug therapy: Secondary | ICD-10-CM | POA: Diagnosis not present

## 2016-01-18 DIAGNOSIS — R5383 Other fatigue: Secondary | ICD-10-CM | POA: Diagnosis not present

## 2016-01-18 MED ORDER — METHYLPREDNISOLONE 4 MG PO TABS: ORAL_TABLET | ORAL | Status: DC

## 2016-01-18 MED ORDER — ACETAMINOPHEN-CODEINE #3 300-30 MG PO TABS: 1.0000 | ORAL_TABLET | Freq: Three times a day (TID) | ORAL | Status: DC | PRN

## 2016-01-18 MED ORDER — MELOXICAM 15 MG PO TABS: 15.0000 mg | ORAL_TABLET | Freq: Every day | ORAL | Status: DC

## 2016-01-18 NOTE — Progress Notes (Signed)
[facility]  PATIENT: Alicia Barker DOB: Apr 10, 1986  REFERRING DOCTOR OR PCP:  PCP is Francoise Ceo SOURCE: patient, records from hospital, MRI / lab reports, MRI images on PACS  _________________________________   HISTORICAL  CHIEF COMPLAINT:  Chief Complaint  Patient presents with  . Multiple Sclerosis    Sts. she continues to tolerate Tecfidera well.  Sts. she sleeps better, less spasms with HS Flexeril.  Today she c/o worsening left hip pain.  Denies known injury/fim  . Left Hip Pain    HISTORY OF PRESENT ILLNESS:  Alicia Barker is a 30 year old woman who was diagnosed with MS in 2017 and started on Tecfidera.    She is tolerating it well.   She denies any new exacerbations or other new issues.    Gait/strength/sensation: She reports gait is doing better and she is not veering when she walks anymore.     Since the last visit, she notes that the numbness that she was experiencing on the right side has improved. Additionally, the leg clumsiness has improved. She has not noted any significant weakness.  Bladder/bowel: She has had more urinary frequency and urgency. She has not had any further episodes of incontinence. She also notes constipation.  Vision: She notes that her vision is doing better.    At this point, she feels that the vision has returned to baseline. She no longer notes. Vision and she no longer notes color changes. There is no double vision.   Fatigue/sleep: She does note fatigue in the last couple months. Her insomnia improved with nighttime cyclobenzaprine..  Mood/cognition: She notes less depression and anxiety compared to last visit.  She also has noted that her cognitive processing speed appears to be slower.  Pain:  She is noting a new stabbing-like pain going from her buttock down the left leg. Pain goes to the foot but she is uncertain if it is more to the top or bottom.  MS History:   In early March, 2017, she had the onset  of slurred speech and leg weakness and ataxia.    A head CT was reportedly normal.    Over the next couple weeks, she had right worse than left visual acuity issues, more numbness and more clumsiness.        Her gait became unsteady.     She also noted mid back pain.    She went to the ER and was diagnosed with an inner ear problem.   She went back to ER 11/15/15 and was found to have an abnormal MRI consistent with MS.    MRI of the spine also showed additional plaques.     She received 5 days of IV Steroids and noticed an improvement with improved gait but continued numbness.    I have reviewed the MRIs of the brain and spine performed for 12/30/2015 and 11/17/2015. The MRI of the brain with and without contrast shows multiple T2/FLAIR hyperintense foci, many in the periventricular white matter.    Another focus is the pons (with mild enhancement, better seen on c-spine MRI).Marland Kitchen 7 foci enhanced after gadolinium administration imply more recent MS plaques.    There are at least 2 small T2 hyperintense foci within the cervical spine, one of which appears to enhance slightly.    Another enhancing focus is noted within the thoracic spine adjacent to T3-T4.  REVIEW OF SYSTEMS: Constitutional: No fevers, chills, sweats, or change in appetite.  She reports fatigue. Eyes: mild right visual changes, double vision, eye pain Ear, nose and throat: No hearing loss, ear pain, nasal congestion, sore throat Cardiovascular: No chest pain, palpitations Respiratory: No shortness of breath at rest or with exertion.   No wheezes GastrointestinaI: No nausea, vomiting, diarrhea, abdominal pain, fecal incontinence Genitourinary: No dysuria, urinary retention.   She has some urgency and frequency..  No nocturia. Musculoskeletal: No neck pain, back pain Integumentary: No rash, pruritus, skin lesions Neurological: as above Psychiatric: Notes  mild depression > anxiety Endocrine: No palpitations, diaphoresis, change in appetite, change in weigh or increased thirst Hematologic/Lymphatic: No anemia, purpura, petechiae. Allergic/Immunologic: No itchy/runny eyes, nasal congestion, recent allergic reactions, rashes  ALLERGIES: Allergies  Allergen Reactions  . Penicillins Anaphylaxis, Shortness Of Breath and Swelling    Has patient had a PCN reaction causing immediate rash, facial/tongue/throat swelling, SOB or lightheadedness with hypotension: YES Has patient had a PCN reaction causing severe rash involving mucus membranes or skin necrosis: NO Has patient had a PCN reaction that required hospitalization NO Has patient had a PCN reaction occurring within the last 10 years: NO If all of the above answers are "NO", then may proceed with Cephalosporin use.    HOME MEDICATIONS:  Current outpatient prescriptions:  .  cyclobenzaprine (FLEXERIL) 5 MG tablet, Take 1 tablet (5 mg total) by mouth at bedtime., Disp: 30 tablet, Rfl: 5 .  Dimethyl Fumarate (TECFIDERA) 120 & 240 MG MISC, Take by mouth., Disp: , Rfl:  .  Dimethyl Fumarate 240 MG CPDR, Take 240 mg by mouth 2 (two) times daily., Disp: , Rfl:  .  meclizine (ANTIVERT) 25 MG tablet, Take 1 tablet (25 mg total) by mouth 3 (three) times daily as needed for dizziness., Disp: 30 tablet, Rfl: 0 .  omeprazole (PRILOSEC) 20 MG capsule, Take 1 capsule (20 mg total) by mouth daily., Disp: 30 capsule, Rfl: 3 .  ondansetron (ZOFRAN) 4 MG tablet, Take 1 tablet (4 mg total) by mouth every 6 (six) hours. Prn nausea or vomiting, Disp: 12 tablet, Rfl: 0 .  ranitidine (ZANTAC) 150 MG tablet, Take 1 tablet (150 mg total) by mouth 2 (two) times daily., Disp: 60 tablet, Rfl: 1 .  traMADol (ULTRAM) 50 MG tablet, Take 1 tablet (50 mg total) by mouth every 8 (eight) hours as needed., Disp: 15 tablet, Rfl: 0 .  Vitamin D, Ergocalciferol, (DRISDOL) 50000 units CAPS capsule, Take 1 capsule (50,000 Units total)  by mouth every 7 (seven) days., Disp: 12 capsule, Rfl: 0 .  acetaminophen-codeine (TYLENOL #3) 300-30 MG tablet, Take 1 tablet by mouth every 8 (eight) hours as needed for moderate pain., Disp: 90 tablet, Rfl: 2 .  meloxicam (MOBIC) 15 MG tablet, Take 1 tablet (15 mg total) by mouth daily., Disp: 30 tablet, Rfl: 11 .  methylPREDNISolone (MEDROL) 4 MG tablet, Take 6 pills po x 1 d, then 5 po x 1 day, then 4 po x 1d, then 3 po x 1d, then 2 po x 1d, then 1 po x 1d, Disp: 21 tablet, Rfl: 0 .  [DISCONTINUED] fluticasone (FLONASE) 50 MCG/ACT nasal spray, Place 1-2 sprays into both nostrils at bedtime. (Patient not taking: Reported on 10/14/2015), Disp: 16 g, Rfl: 0 .  [DISCONTINUED] ipratropium (ATROVENT) 0.06 % nasal spray, Place 2 sprays into both nostrils 4 (four) times daily. (Patient not taking: Reported on 10/14/2015), Disp: 15 mL, Rfl: 12  PAST MEDICAL HISTORY: Past  Medical History  Diagnosis Date  . SVD (spontaneous vaginal delivery)     x 5  . Depression     no meds currently  . MS (multiple sclerosis) (HCC)     PAST SURGICAL HISTORY: Past Surgical History  Procedure Laterality Date  . Right hand surgery    . Laparoscopic tubal ligation Bilateral 01/06/2013    Procedure: LAPAROSCOPIC TUBAL LIGATION;  Surgeon: Kathreen Cosier, MD;  Location: [facility];  Service: Gynecology;  Laterality: Bilateral;    FAMILY HISTORY: Family History  Problem Relation Age of Onset  . Diabetes Mother   . Heart disease Mother   . Diabetes Father   . Multiple sclerosis Cousin     SOCIAL HISTORY:  Social History   Social History  . Marital Status: Married    Spouse Name: N/A  . Number of Children: N/A  . Years of Education: N/A   Occupational History  . Not on file.   Social History Main Topics  . Smoking status: Former Smoker -- 0.10 packs/day    Types: Cigarettes  . Smokeless tobacco: Never Used  . Alcohol Use: No  . Drug Use: No  . Sexual Activity: Yes    Birth Control/ Protection: None    Other Topics Concern  . Not on file   Social History Narrative     PHYSICAL EXAM  Filed Vitals:   01/18/16 1128  BP: 116/84  Pulse: 78  Resp: 14  Height: 5\' 4"  (1.626 m)  Weight: 204 lb 8 oz (92.761 kg)    Body mass index is 35.09 kg/(m^2).   General: The patient is well-developed and well-nourished and in no acute distress.  Skin:   There is no rash. There is no pedal edema.   Musculoskeletal:  Back is tender over lumbar paraspinals and left greater than right piriformis muscle. She also has some tenderness at the left trochanteric bursa.    Neck is nontender.  Neurologic Exam  Mental status: The patient is alert and oriented x 3 at the time of the examination. The patient has apparent normal recent and remote memory, with an apparently normal attention span and concentration ability.   Speech is normal.  Cranial nerves: Extraocular movements are full. She had a 1+ right APD.  Facial symmetry is present. There is good facial sensation to  soft touch bilaterally.Facial strength is normal.  Trapezius and sternocleidomastoid strength is normal. No dysarthria is noted.  The tongue is midline, and the patient has symmetric elevation of the soft palate. No obvious hearing deficits are noted.  Motor:  Muscle bulk is normal.   Tone is normal. Strength is  5 / 5 in all 4 extremities.   Sensory: Sensory testing is intact to touch and vibration sensation in all 4 extremities.  Coordination: Cerebellar testing reveals good finger-nose-finger and heel-to-shin bilaterally.  Gait and station: Station is normal.   Gait is normal. Tandem gait is normal. Romberg is negative.   Reflexes: Deep tendon reflexes are symmetric and normal bilaterally.    Plantar reflexes are normal.      DIAGNOSTIC DATA (LABS, IMAGING, TESTING) - I reviewed patient records, labs, notes, testing and imaging myself where available.  Lab Results  Component Value Date   WBC 6.8 11/28/2015   HGB 12.6  11/22/2015   HCT 35.9 11/28/2015   MCV 91 11/28/2015   PLT 215 11/28/2015      Component Value Date/Time   NA 137 11/22/2015 1211   K 3.1* 11/22/2015 1211  CL 101 11/22/2015 1211   CO2 24 11/22/2015 1211   GLUCOSE 131* 11/22/2015 1211   BUN 21* 11/22/2015 1211   CREATININE 0.94 11/22/2015 1211   CALCIUM 8.5* 11/22/2015 1211   PROT 6.7 11/28/2015 1109   PROT 6.0* 10/14/2015 1432   ALBUMIN 3.7 11/28/2015 1109   ALBUMIN 3.3* 10/14/2015 1432   AST 17 11/28/2015 1109   ALT 36* 11/28/2015 1109   ALKPHOS 62 11/28/2015 1109   BILITOT 0.3 11/28/2015 1109   BILITOT 0.4 10/14/2015 1432   GFRNONAA >60 11/22/2015 1211   GFRAA >60 11/22/2015 1211       ASSESSMENT AND PLAN  Multiple sclerosis (HCC)  Ataxia  High risk medication use  Numbness  Other fatigue  Urinary frequency  Visual disturbance  Left hip pain  Left sided sciatica    1.   She will continue Tecfidera. 2.    For her back pain, I have prescribed a steroid pack, meloxicam.   Because his tramadol is not helping her much I'll have her try Tylenol with Codeine 3.,   She is advised to exercise stay active. 4.    Return in 4 months or sooner if there are new or worsening neurologic symptoms. We discussed symptoms of  possible exacerbations.   Richard A. Epimenio Foot, MD, PhD 01/18/2016, 3:21 PM Certified in Neurology, Clinical Neurophysiology, Sleep Medicine, Pain Medicine and Neuroimaging  [facility] [address] [place], [place] [address] [phone]

## 2016-02-22 ENCOUNTER — Ambulatory Visit: Payer: Medicaid Other | Admitting: Neurology

## 2016-02-22 ENCOUNTER — Encounter (HOSPITAL_COMMUNITY): Payer: Self-pay | Admitting: Emergency Medicine

## 2016-02-22 ENCOUNTER — Inpatient Hospital Stay (HOSPITAL_COMMUNITY)
Admission: EM | Admit: 2016-02-22 | Discharge: 2016-02-25 | DRG: 060 | Disposition: A | Discharge status: Home Health | Payer: Medicaid Other | Attending: Family Medicine | Admitting: Family Medicine

## 2016-02-22 DIAGNOSIS — Z590 Homelessness: Secondary | ICD-10-CM | POA: Diagnosis not present

## 2016-02-22 DIAGNOSIS — Z87891 Personal history of nicotine dependence: Secondary | ICD-10-CM

## 2016-02-22 DIAGNOSIS — H5500 Unspecified nystagmus: Secondary | ICD-10-CM | POA: Diagnosis present

## 2016-02-22 DIAGNOSIS — G35 Multiple sclerosis: Secondary | ICD-10-CM | POA: Diagnosis present

## 2016-02-22 DIAGNOSIS — H532 Diplopia: Secondary | ICD-10-CM | POA: Insufficient documentation

## 2016-02-22 DIAGNOSIS — G35D Multiple sclerosis, unspecified: Secondary | ICD-10-CM | POA: Diagnosis present

## 2016-02-22 DIAGNOSIS — R531 Weakness: Secondary | ICD-10-CM | POA: Insufficient documentation

## 2016-02-22 DIAGNOSIS — F329 Major depressive disorder, single episode, unspecified: Secondary | ICD-10-CM | POA: Diagnosis present

## 2016-02-22 DIAGNOSIS — Z79899 Other long term (current) drug therapy: Secondary | ICD-10-CM

## 2016-02-22 LAB — CBC
HCT: 37 % (ref 36.0–46.0)
HCT: 36.6 % (ref 36.0–46.0)
HEMOGLOBIN: 12.3 g/dL (ref 12.0–15.0)
Hemoglobin: 12.6 g/dL (ref 12.0–15.0)
MCH: 29.8 pg (ref 26.0–34.0)
MCH: 29.5 pg (ref 26.0–34.0)
MCHC: 34.1 g/dL (ref 30.0–36.0)
MCHC: 33.6 g/dL (ref 30.0–36.0)
MCV: 87.5 fL (ref 78.0–100.0)
MCV: 87.8 fL (ref 78.0–100.0)
PLATELETS: 246 10*3/uL (ref 150–400)
Platelets: 277 10*3/uL (ref 150–400)
RBC: 4.23 MIL/uL (ref 3.87–5.11)
RBC: 4.17 MIL/uL (ref 3.87–5.11)
RDW: 12.7 % (ref 11.5–15.5)
RDW: 12.7 % (ref 11.5–15.5)
WBC: 5.7 10*3/uL (ref 4.0–10.5)
WBC: 7.6 10*3/uL (ref 4.0–10.5)

## 2016-02-22 LAB — COMPREHENSIVE METABOLIC PANEL
ALBUMIN: 4.1 g/dL (ref 3.5–5.0)
ALK PHOS: 57 U/L (ref 38–126)
ALT: 21 U/L (ref 14–54)
AST: 22 U/L (ref 15–41)
Anion gap: 10 (ref 5–15)
BILIRUBIN TOTAL: 0.8 mg/dL (ref 0.3–1.2)
BUN: 8 mg/dL (ref 6–20)
CALCIUM: 9.4 mg/dL (ref 8.9–10.3)
CO2: 21 mmol/L — AB (ref 22–32)
CREATININE: 0.8 mg/dL (ref 0.44–1.00)
Chloride: 106 mmol/L (ref 101–111)
GFR calc Af Amer: >60 mL/min (ref >60)
GFR calc non Af Amer: >60 mL/min (ref >60)
GLUCOSE: 117 mg/dL — AB (ref 65–99)
Potassium: 3.3 mmol/L — ABNORMAL LOW (ref 3.5–5.1)
SODIUM: 137 mmol/L (ref 135–145)
Total Protein: 7.2 g/dL (ref 6.5–8.1)

## 2016-02-22 LAB — LIPASE, BLOOD: Lipase: 18 U/L (ref 11–51)

## 2016-02-22 MED ORDER — ONDANSETRON 4 MG PO TBDP
4.0000 mg | ORAL_TABLET | Freq: Once | ORAL | Status: AC | PRN
  Administered 2016-02-22: 4 mg via ORAL

## 2016-02-22 MED ORDER — ACETAMINOPHEN 650 MG RE SUPP: 650.0000 mg | Freq: Four times a day (QID) | RECTAL | Status: DC | PRN

## 2016-02-22 MED ORDER — SODIUM CHLORIDE 0.9% FLUSH
3.0000 mL | Freq: Two times a day (BID) | INTRAVENOUS | Status: DC
  Administered 2016-02-24 (×2): 3 mL via INTRAVENOUS

## 2016-02-22 MED ORDER — MECLIZINE HCL 12.5 MG PO TABS
25.0000 mg | ORAL_TABLET | Freq: Three times a day (TID) | ORAL | Status: DC | PRN
  Administered 2016-02-23: 25 mg via ORAL
  Filled 2016-02-22: qty 2

## 2016-02-22 MED ORDER — ONDANSETRON 4 MG PO TBDP
ORAL_TABLET | ORAL | Status: AC
  Filled 2016-02-22: qty 1

## 2016-02-22 MED ORDER — PANTOPRAZOLE SODIUM 40 MG PO TBEC
40.0000 mg | DELAYED_RELEASE_TABLET | Freq: Every day | ORAL | Status: DC
  Administered 2016-02-23 – 2016-02-25 (×3): 40 mg via ORAL
  Filled 2016-02-22 (×3): qty 1

## 2016-02-22 MED ORDER — ENOXAPARIN SODIUM 40 MG/0.4ML ~~LOC~~ SOLN
40.0000 mg | Freq: Every day | SUBCUTANEOUS | Status: DC
  Administered 2016-02-22 – 2016-02-24 (×3): 40 mg via SUBCUTANEOUS
  Filled 2016-02-22 (×3): qty 0.4

## 2016-02-22 MED ORDER — ONDANSETRON HCL 4 MG PO TABS: 4.0000 mg | ORAL_TABLET | Freq: Four times a day (QID) | ORAL | Status: DC | PRN

## 2016-02-22 MED ORDER — SODIUM CHLORIDE 0.9 % IV SOLN: 250.0000 mL | INTRAVENOUS | Status: DC | PRN

## 2016-02-22 MED ORDER — SODIUM CHLORIDE 0.9 % IV SOLN
500.0000 mg | Freq: Two times a day (BID) | INTRAVENOUS | Status: AC
  Administered 2016-02-22 – 2016-02-24 (×5): 500 mg via INTRAVENOUS
  Filled 2016-02-22 (×5): qty 4

## 2016-02-22 MED ORDER — DIMETHYL FUMARATE 240 MG PO CPDR
240.0000 mg | DELAYED_RELEASE_CAPSULE | Freq: Two times a day (BID) | ORAL | Status: DC
  Administered 2016-02-23 – 2016-02-25 (×4): 240 mg via ORAL
  Filled 2016-02-22: qty 1

## 2016-02-22 MED ORDER — SODIUM CHLORIDE 0.9% FLUSH: 3.0000 mL | INTRAVENOUS | Status: DC | PRN

## 2016-02-22 MED ORDER — VITAMIN D (ERGOCALCIFEROL) 1.25 MG (50000 UNIT) PO CAPS: 50000.0000 [IU] | ORAL_CAPSULE | ORAL | Status: DC

## 2016-02-22 MED ORDER — ACETAMINOPHEN 325 MG PO TABS
650.0000 mg | ORAL_TABLET | Freq: Four times a day (QID) | ORAL | Status: DC | PRN
  Administered 2016-02-24 – 2016-02-25 (×2): 650 mg via ORAL
  Filled 2016-02-22 (×2): qty 2

## 2016-02-22 MED ORDER — SODIUM CHLORIDE 0.9 % IV SOLN
500.0000 mg | Freq: Once | INTRAVENOUS | Status: AC
  Administered 2016-02-22: 500 mg via INTRAVENOUS
  Filled 2016-02-22: qty 4

## 2016-02-22 NOTE — H&P (Signed)
Family Medicine Teaching Service Hospital Admission History and Physical Service Pager: [phone]  Patient name: Alicia Barker Medical record number: [id number] Date of birth: [birth year] Age: 30 y.o. Gender: female  Primary Care Provider: Kathreen Cosier, MD Consultants: Neurology  Code Status: Full   Chief Complaint: gait instability, vertigo, diplopia in patient with known MS  Assessment and Plan: Alicia Barker is a 30 y.o. female with a PMH significant for MS presenting weakness, gait instability, vertigo and diplopia.  She is currently on Tecfidera and was doing well until two days prior to admission.   Weakness with associated gait instability, diplopia and vertigo suggestive of MS exacerbation:  The patient noted feeling unsteady on her feet and some subjective weakness in her R>L lower extremity that has worsened over the past 24 hours.  She has associated diplopia and vertigo with some vomiting.  These were also the presenting symptoms she had a few months ago when she was diagnosed with MS.  She was told to return if she became symptomatic.  Seen by Neurology, recommend starting on methylprednisone  Q12hrs and continuing her Tecfidera BID.  Unclear if plans to reimage, will defer to Neurology.  Last MRIs in 4/17.  Also recommend PT/OT consult.  - Admit to family medicine teaching service; attending, Dr. Deirdre Priest - f/u neuro recs - possible reimaging - zofran  q6hr PRN for nausea - f/u PT/OT consult tomorrow  - Solumedrol  BID x 6 doses - Continue Meclizine PRN - Continue Dimethyl Fumarate  BID - Continue PPI to protect from ulcers 2/2 steroids  Vomiting:  Patient has had 4 episodes of vomiting in the past 24 hours mostly associated with vertigo and diplopia.  Very likely a symptom of her MS exacerbation. She denies any sick contacts. Currently has some nausea and was given zofran  in the ED and is comfortable. Is tolerating PO at the moment. - zofran   Q6hr prn - maintenance fluids as appropriate, but currently tolerating PO, will reassess  FEN/GI: regular diet, protonix Prophylaxis: lovenox  Disposition: Pending medical improvement  History of Present Illness:  Alicia Barker is a 30 y.o. female presenting with new onset weakness of the lower extremities, R>L, gait instability, vertigo and diplopia in the context of a recent diagnosis of multiple sclerosis.   She stated that the symptoms began on the day prior to admission, when she began feeling unsteady on her feet.  She noted weakness in both of her legs, but more so in her right than the left. She was diagnosed with MS in April and previously had some lower extremity weakness and instability, but it had been stable for the past couple of months.   At this time she also had double vision and felt like her head was spinning.  She denied any tingling or numbness in her extremities or elsewhere.  She also endorses nausea and vomiting x4 in the past day.  [facility] and has noted no side effects aside from some flushing. Currently on Tecfidera.  No problems with urination.  No trauma/injuries. Denies any sick contacts.   Patient assessed by neurology in ED who suggested inpatient treatment w/ high dose IV steroids.  Review Of Systems: Per HPI    Patient Active Problem List   Diagnosis Date Noted  . Multiple sclerosis exacerbation (HCC) 02/22/2016  . Weakness   . Diplopia   . Left hip pain 01/18/2016  . Left sided sciatica 01/18/2016  . Numbness 11/28/2015  .  Ataxia 11/28/2015  . Visual disturbance 11/28/2015  . Other fatigue 11/28/2015  . Urinary frequency 11/28/2015  . High risk medication use 11/28/2015  . Chest pain 11/25/2015  . 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 Dizziness   . Multiple sclerosis (HCC) 11/15/2015    Past Medical History: Past Medical History  Diagnosis Date  . SVD (spontaneous vaginal delivery)     x 5  .  Depression     no meds currently  . MS (multiple sclerosis) (HCC)     Past Surgical History: Past Surgical History  Procedure Laterality Date  . Right hand surgery    . Laparoscopic tubal ligation Bilateral 01/06/2013    Procedure: LAPAROSCOPIC TUBAL LIGATION;  Surgeon: Kathreen Cosier, MD;  Location: [facility];  Service: Gynecology;  Laterality: Bilateral;    Social History: Social History  Substance Use Topics  . Smoking status: Former Smoker -- 0.10 packs/day    Types: Cigarettes  . Smokeless tobacco: Never Used  . Alcohol Use: No     Family History: Family History  Problem Relation Age of Onset  . Diabetes Mother   . Heart disease Mother   . Diabetes Father   . Multiple sclerosis Cousin     Allergies and Medications: Allergies  Allergen Reactions  . Penicillins Anaphylaxis, Shortness Of Breath and Swelling    Has patient had a PCN reaction causing immediate rash, facial/tongue/throat swelling, SOB or lightheadedness with hypotension: YES Has patient had a PCN reaction causing severe rash involving mucus membranes or skin necrosis: NO Has patient had a PCN reaction that required hospitalization NO Has patient had a PCN reaction occurring within the last 10 years: NO If all of the above answers are "NO", then may proceed with Cephalosporin use.   No current facility-administered medications on file prior to encounter.   Current Outpatient Prescriptions on File Prior to Encounter  Medication Sig Dispense Refill  . acetaminophen-codeine (TYLENOL #3) 300-30 MG tablet Take 1 tablet by mouth every 8 (eight) hours as needed for moderate pain. 90 tablet 2  . Dimethyl Fumarate 240 MG CPDR Take 240 mg by mouth 2 (two) times daily.    . meloxicam (MOBIC) 15 MG tablet Take 1 tablet (15 mg total) by mouth daily. 30 tablet 11  . Vitamin D, Ergocalciferol, (DRISDOL) 50000 units CAPS capsule Take 1 capsule (50,000 Units total) by mouth every 7 (seven) days. (Patient taking  differently: Take 50,000 Units by mouth every 7 (seven) days. On Monday) 12 capsule 0  . cyclobenzaprine (FLEXERIL) 5 MG tablet Take 1 tablet (5 mg total) by mouth at bedtime. (Patient not taking: Reported on 02/22/2016) 30 tablet 5  . meclizine (ANTIVERT) 25 MG tablet Take 1 tablet (25 mg total) by mouth 3 (three) times daily as needed for dizziness. (Patient not taking: Reported on 02/22/2016) 30 tablet 0  . methylPREDNISolone (MEDROL) 4 MG tablet Take 6 pills po x 1 d, then 5 po x 1 day, then 4 po x 1d, then 3 po x 1d, then 2 po x 1d, then 1 po x 1d (Patient not taking: Reported on 02/22/2016) 21 tablet 0  . omeprazole (PRILOSEC) 20 MG capsule Take 1 capsule (20 mg total) by mouth daily. (Patient not taking: Reported on 02/22/2016) 30 capsule 3  . ondansetron (ZOFRAN) 4 MG tablet Take 1 tablet (4 mg total) by mouth every 6 (six) hours. Prn nausea or vomiting (Patient not taking: Reported on 02/22/2016) 12 tablet 0  . ranitidine (ZANTAC) 150  MG tablet Take 1 tablet (150 mg total) by mouth 2 (two) times daily. (Patient not taking: Reported on 02/22/2016) 60 tablet 1  . traMADol (ULTRAM) 50 MG tablet Take 1 tablet (50 mg total) by mouth every 8 (eight) hours as needed. (Patient not taking: Reported on 02/22/2016) 15 tablet 0  . [DISCONTINUED] fluticasone (FLONASE) 50 MCG/ACT nasal spray Place 1-2 sprays into both nostrils at bedtime. (Patient not taking: Reported on 10/14/2015) 16 g 0  . [DISCONTINUED] ipratropium (ATROVENT) 0.06 % nasal spray Place 2 sprays into both nostrils 4 (four) times daily. (Patient not taking: Reported on 10/14/2015) 15 mL 12    Objective: BP 113/71 mmHg  Pulse 47  Temp(Src) 97.9 F (36.6 C) (Oral)  Resp 18  Ht 5\' 4"  (1.626 m)  Wt 199 lb 11.8 oz (90.6 kg)  BMI 34.27 kg/m2  SpO2 100%  LMP 02/01/2016 (Approximate) Exam: General: patient curled up in bed, appears uncomfortable, but NAD Eyes: EOMI, PERRLA, no injection ENTM: moist mucous membranes, uvula midline, no facial  droop Neck: supple, non-tender, no LAD Cardiovascular: RRR, s1/s2 present, no mrg Respiratory: CTABL, normal work of breathing Abdomen: soft, non-tender, non-distended MSK: non-tender, full range of motion, normal muscle tone, no clonus in lower extremities.  Skin: warm, dry Neuro: AAOx3, DTR intact bilaterally, sensation normal Psych: normal mood and affect  Labs and Imaging: CBC BMET   Recent Labs Lab 02/22/16 2257  WBC 7.6  HGB 12.3  HCT 36.6  PLT 277    Recent Labs Lab 02/22/16 1716  NA 137  K 3.3*  CL 106  CO2 21*  BUN 8  CREATININE 0.80  GLUCOSE 117*  CALCIUM 9.4      Renne Musca, MD 02/23/2016, 12:23 AM PGY-1, [facility] FPTS Intern pager: [phone], text pages welcome    Upper Level Addendum:  I have seen and evaluated this patient along with Dr. Myrtie Soman and reviewed the above note, making necessary revisions in red.   Kathee Delton, MD,MS,  PGY3 02/23/2016 3:01 AM

## 2016-02-22 NOTE — ED Notes (Signed)
Pt here with double vision, nausea, emesis, and loss of balance since yesterday morning. Pt sts she believes she is having an MS flare up. Pt also reports abdominal pain.

## 2016-02-22 NOTE — ED Notes (Signed)
Pt given ginger ale.

## 2016-02-22 NOTE — ED Provider Notes (Signed)
CSN: [id number]     Arrival date & time 02/22/16  1606 History   First MD Initiated Contact with Patient 02/22/16 1952     Chief Complaint  Patient presents with  . Multiple Sclerosis     (Consider location/radiation/quality/duration/timing/severity/associated sxs/prior Treatment) HPI Comments: Patient is a 30 year old female with recently diagnosed multiple sclerosis. She presents today for evaluation of double vision, nausea, and loss of balance which has worsened over the past 2 days. She denies any fevers or chills. She does report some headache. She was hospitalized 2 months ago at which time she was diagnosed with MS and was instructed to return for these symptoms.  The history is provided by the patient.    Past Medical History  Diagnosis Date  . SVD (spontaneous vaginal delivery)     x 5  . Depression     no meds currently  . MS (multiple sclerosis) [facility])    Past Surgical History  Procedure Laterality Date  . Right hand surgery    . Laparoscopic tubal ligation Bilateral 01/06/2013    Procedure: LAPAROSCOPIC TUBAL LIGATION;  Surgeon: Kathreen Cosier, MD;  Location: [facility];  Service: Gynecology;  Laterality: Bilateral;   Family History  Problem Relation Age of Onset  . Diabetes Mother   . Heart disease Mother   . Diabetes Father   . Multiple sclerosis Cousin    Social History  Substance Use Topics  . Smoking status: Former Smoker -- 0.10 packs/day    Types: Cigarettes  . Smokeless tobacco: Never Used  . Alcohol Use: No   OB History    Gravida Para Term Preterm AB TAB SAB Ectopic Multiple Living   5 5 4 1  0 0 0 0 0 5     Review of Systems  All other systems reviewed and are negative.     Allergies  Penicillins  Home Medications   Prior to Admission medications   Medication Sig Start Date End Date Taking? Authorizing Provider  acetaminophen-codeine (TYLENOL #3) 300-30 MG tablet Take 1 tablet by mouth every 8 (eight) hours as needed for moderate pain.  01/18/16   Asa Lente, MD  cyclobenzaprine (FLEXERIL) 5 MG tablet Take 1 tablet (5 mg total) by mouth at bedtime. 11/28/15   Asa Lente, MD  Dimethyl Fumarate (TECFIDERA) 120 & 240 MG MISC Take by mouth.    Historical Provider, MD  Dimethyl Fumarate 240 MG CPDR Take 240 mg by mouth 2 (two) times daily.    Historical Provider, MD  meclizine (ANTIVERT) 25 MG tablet Take 1 tablet (25 mg total) by mouth 3 (three) times daily as needed for dizziness. 11/10/15   Hayden Rasmussen, NP  meloxicam (MOBIC) 15 MG tablet Take 1 tablet (15 mg total) by mouth daily. 01/18/16   Asa Lente, MD  methylPREDNISolone (MEDROL) 4 MG tablet Take 6 pills po x 1 d, then 5 po x 1 day, then 4 po x 1d, then 3 po x 1d, then 2 po x 1d, then 1 po x 1d 01/18/16   Asa Lente, MD  omeprazole (PRILOSEC) 20 MG capsule Take 1 capsule (20 mg total) by mouth daily. 11/23/15   Latrelle Dodrill, MD  ondansetron (ZOFRAN) 4 MG tablet Take 1 tablet (4 mg total) by mouth every 6 (six) hours. Prn nausea or vomiting 11/10/15   Hayden Rasmussen, NP  ranitidine (ZANTAC) 150 MG tablet Take 1 tablet (150 mg total) by mouth 2 (two) times daily. 11/23/15 11/22/16  Estevan Ryder  Pollie Meyer, MD  traMADol (ULTRAM) 50 MG tablet Take 1 tablet (50 mg total) by mouth every 8 (eight) hours as needed. 11/23/15   Latrelle Dodrill, MD  Vitamin D, Ergocalciferol, (DRISDOL) 50000 units CAPS capsule Take 1 capsule (50,000 Units total) by mouth every 7 (seven) days. 11/29/15   Asa Lente, MD   BP 121/78 mmHg  Pulse 75  Temp(Src) 97.6 F (36.4 C) (Oral)  Resp 16  SpO2 100%  LMP 02/01/2016 (Approximate) Physical Exam  Constitutional: She is oriented to person, place, and time. She appears well-developed and well-nourished. No distress.  HENT:  Head: Normocephalic and atraumatic.  Eyes: EOM are normal. Pupils are equal, round, and reactive to light.  Neck: Normal range of motion. Neck supple.  Cardiovascular: Normal rate and regular rhythm.  Exam reveals no  gallop and no friction rub.   No murmur heard. Pulmonary/Chest: Effort normal and breath sounds normal. No respiratory distress. She has no wheezes.  Abdominal: Soft. Bowel sounds are normal. She exhibits no distension. There is no tenderness.  Musculoskeletal: Normal range of motion.  Neurological: She is alert and oriented to person, place, and time. No cranial nerve deficit. She exhibits normal muscle tone. Coordination normal.  Skin: Skin is warm and dry. She is not diaphoretic.  Nursing note and vitals reviewed.   ED Course  Procedures (including critical care time) Labs Review Labs Reviewed  COMPREHENSIVE METABOLIC PANEL - Abnormal; Notable for the following:    Potassium 3.3 (*)    CO2 21 (*)    Glucose, Bld 117 (*)    All other components within normal limits  LIPASE, BLOOD  CBC  URINALYSIS, ROUTINE W REFLEX MICROSCOPIC (NOT AT [facility])    Imaging Review No results found. I have personally reviewed and evaluated these images and lab results as part of my medical decision-making.   EKG Interpretation None      MDM   Final diagnoses:  None    Labs are unremarkable. This appears to be an exacerbation of multiple sclerosis. I discussed the case with Dr. Roseanne Reno who is recommending admission for IV steroids. She was given 500 mg of IV Solu-Medrol and will be admitted to the family medicine service.    Geoffery Lyons, MD 02/22/16 2240

## 2016-02-22 NOTE — ED Notes (Signed)
Pt and family made aware of bed assignment 

## 2016-02-22 NOTE — Progress Notes (Signed)
Patient arrived to 5M10 AAOx4 and moved to the bed with two assist. She is sleeping at this time. Vtials taken and WNL. Continuing to monitor. Jaina Morin, [place] Scrape, RN

## 2016-02-22 NOTE — Consult Note (Signed)
Admission H&P    Chief Complaint: Weakness, diplopia and vertigo in patient with known MS.  HPI: Alicia Barker is an 30 y.o. female with a history of multiple sclerosis diagnosed in April of this year and treated with high-dose steroids initially and placed Tecfidera, presenting with new onset weakness involving lower extremities, right greater than left, as well as gait instability, vertigo and diplopia. Symptoms started on 02/21/2016. She said no problems with taking Tecfidera except for mild flushing. She has not experienced symptoms involving upper extremities other than coordination difficulty of the left upper extremity. She is being admitted for repeat treatment with high-dose steroids as well as occupational therapy intervention.  Past Medical History  Diagnosis Date  . SVD (spontaneous vaginal delivery)     x 5  . Depression     no meds currently  . MS (multiple sclerosis) [facility])     Past Surgical History  Procedure Laterality Date  . Right hand surgery    . Laparoscopic tubal ligation Bilateral 01/06/2013    Procedure: LAPAROSCOPIC TUBAL LIGATION;  Surgeon: Frederico Hamman, MD;  Location: [facility];  Service: Gynecology;  Laterality: Bilateral;    Family History  Problem Relation Age of Onset  . Diabetes Mother   . Heart disease Mother   . Diabetes Father   . Multiple sclerosis Cousin    Social History:  reports that she has quit smoking. Her smoking use included Cigarettes. She smoked 0.10 packs per day. She has never used smokeless tobacco. She reports that she does not drink alcohol or use illicit drugs.  Allergies:  Allergies  Allergen Reactions  . Penicillins Anaphylaxis, Shortness Of Breath and Swelling    Has patient had a PCN reaction causing immediate rash, facial/tongue/throat swelling, SOB or lightheadedness with hypotension: YES Has patient had a PCN reaction causing severe rash involving mucus membranes or skin necrosis: NO Has patient had a PCN reaction  that required hospitalization NO Has patient had a PCN reaction occurring within the last 10 years: NO If all of the above answers are "NO", then may proceed with Cephalosporin use.    Medications: Preadmission medications were reviewed by me.  ROS: History obtained from the patient  General ROS: negative for - chills, fatigue, fever, night sweats, weight gain or weight loss Psychological ROS: negative for - behavioral disorder, hallucinations, memory difficulties, mood swings or suicidal ideation Ophthalmic ROS: negative for - blurry vision, double vision, eye pain or loss of vision ENT ROS: negative for - epistaxis, nasal discharge, oral lesions, sore throat, tinnitus or vertigo Allergy and Immunology ROS: negative for - hives or itchy/watery eyes Hematological and Lymphatic ROS: negative for - bleeding problems, bruising or swollen lymph nodes Endocrine ROS: negative for - galactorrhea, hair pattern changes, polydipsia/polyuria or temperature intolerance Respiratory ROS: negative for - cough, hemoptysis, shortness of breath or wheezing Cardiovascular ROS: negative for - chest pain, dyspnea on exertion, edema or irregular heartbeat Gastrointestinal ROS: negative for - abdominal pain, diarrhea, hematemesis, nausea/vomiting or stool incontinence Genito-Urinary ROS: negative for - dysuria, hematuria, incontinence or urinary frequency/urgency Musculoskeletal ROS: negative for - joint swelling or muscular weakness Neurological ROS: as noted in HPI Dermatological ROS: negative for rash and skin lesion changes  Physical Examination: Blood pressure 107/66, pulse 53, temperature 97.6 F (36.4 C), temperature source Oral, resp. rate 16, last menstrual period 02/01/2016, SpO2 100 %.  HEENT-  Normocephalic, no lesions, without obvious abnormality.  Normal external eye and conjunctiva.  Normal TM's bilaterally.  Normal auditory  canals and external ears. Normal external nose, mucus membranes and  septum.  Normal pharynx. Neck supple with no masses, nodes, nodules or enlargement. Cardiovascular - regular rate and rhythm, S1, S2 normal, no murmur, click, rub or gallop Lungs - chest clear, no wheezing, rales, normal symmetric air entry Abdomen - soft, non-tender; bowel sounds normal; no masses,  no organomegaly Extremities - no joint deformities, effusion, or inflammation and no edema  Neurologic Examination: Mental Status: Alert, oriented, thought content appropriate.  Speech fluent without evidence of aphasia. Able to follow commands without difficulty. Cranial Nerves: II-Visual fields were normal. III/IV/VI-Pupils were equal and reacted normally to light. Extraocular movements were full and conjugate.    V/VII-no facial numbness and no facial weakness. VIII-normal. X-normal speech and symmetrical palatal movement. XI: trapezius strength/neck flexion strength normal bilaterally XII-midline tongue extension with normal strength. Motor: Mild weakness of hip flexors bilaterally; motor exam otherwise unremarkable. Sensory: Normal throughout. Deep Tendon Reflexes: 1+ and symmetric. Plantars: Flexor bilaterally Cerebellar: Moderately impaired coordination of left upper extremity. Carotid auscultation: Normal  Results for orders placed or performed during the hospital encounter of 02/22/16 (from the past 48 hour(s))  Lipase, blood     Status: None   Collection Time: 02/22/16  5:16 PM  Result Value Ref Range   Lipase 18 11 - 51 U/L  Comprehensive metabolic panel     Status: Abnormal   Collection Time: 02/22/16  5:16 PM  Result Value Ref Range   Sodium 137 135 - 145 mmol/L   Potassium 3.3 (L) 3.5 - 5.1 mmol/L   Chloride 106 101 - 111 mmol/L   CO2 21 (L) 22 - 32 mmol/L   Glucose, Bld 117 (H) 65 - 99 mg/dL   BUN 8 6 - 20 mg/dL   Creatinine, Ser 0.80 0.44 - 1.00 mg/dL   Calcium 9.4 8.9 - 10.3 mg/dL   Total Protein 7.2 6.5 - 8.1 g/dL   Albumin 4.1 3.5 - 5.0 g/dL   AST 22 15 - 41  U/L   ALT 21 14 - 54 U/L   Alkaline Phosphatase 57 38 - 126 U/L   Total Bilirubin 0.8 0.3 - 1.2 mg/dL   GFR calc non Af Amer >60 >60 mL/min   GFR calc Af Amer >60 >60 mL/min    Comment: (NOTE) The eGFR has been calculated using the CKD EPI equation. This calculation has not been validated in all clinical situations. eGFR's persistently <60 mL/min signify possible Chronic Kidney Disease.    Anion gap 10 5 - 15  CBC     Status: None   Collection Time: 02/22/16  5:16 PM  Result Value Ref Range   WBC 5.7 4.0 - 10.5 K/uL   RBC 4.23 3.87 - 5.11 MIL/uL   Hemoglobin 12.6 12.0 - 15.0 g/dL   HCT 37.0 36.0 - 46.0 %   MCV 87.5 78.0 - 100.0 fL   MCH 29.8 26.0 - 34.0 pg   MCHC 34.1 30.0 - 36.0 g/dL   RDW 12.7 11.5 - 15.5 %   Platelets 246 150 - 400 K/uL   No results found.  Assessment/Plan 30 year old lady with recently diagnosed multiple sclerosis presenting with acute exacerbation as described above.  Recommendations: 1. Solu-Medrol 500 mg every 12 hours for a total of 6 doses IV 2. Continue Tecfidera twice a day 3. PT and OT consults  We will continue to follow this patient with you.  C.R. Nicole Kindred, MD [facility]  02/22/2016, 9:18 PM

## 2016-02-23 LAB — BASIC METABOLIC PANEL
ANION GAP: 10 (ref 5–15)
BUN: 10 mg/dL (ref 6–20)
CHLORIDE: 104 mmol/L (ref 101–111)
CO2: 23 mmol/L (ref 22–32)
Calcium: 9.5 mg/dL (ref 8.9–10.3)
Creatinine, Ser: 0.69 mg/dL (ref 0.44–1.00)
GFR calc Af Amer: >60 mL/min (ref >60)
GFR calc non Af Amer: >60 mL/min (ref >60)
Glucose, Bld: 167 mg/dL — ABNORMAL HIGH (ref 65–99)
POTASSIUM: 3.9 mmol/L (ref 3.5–5.1)
SODIUM: 137 mmol/L (ref 135–145)

## 2016-02-23 LAB — URINALYSIS, ROUTINE W REFLEX MICROSCOPIC
BILIRUBIN URINE: NEGATIVE
Glucose, UA: NEGATIVE mg/dL
Hgb urine dipstick: NEGATIVE
Ketones, ur: 15 mg/dL — AB
NITRITE: NEGATIVE
Protein, ur: NEGATIVE mg/dL
SPECIFIC GRAVITY, URINE: 1.03 (ref 1.005–1.030)
pH: 6 (ref 5.0–8.0)

## 2016-02-23 LAB — URINE MICROSCOPIC-ADD ON
RBC / HPF: NONE SEEN RBC/hpf (ref 0–5)
SQUAMOUS EPITHELIAL / LPF: NONE SEEN

## 2016-02-23 LAB — TSH: TSH: 1.287 u[IU]/mL (ref 0.350–4.500)

## 2016-02-23 NOTE — Progress Notes (Signed)
Urine sample obtained, but order for UA canceled when patient transferred to unit from ER. Paged attending physician to clarify order. Lawson Radar

## 2016-02-23 NOTE — Evaluation (Signed)
Occupational Therapy Evaluation Patient Details Name: Alicia Barker MRN: [id number] DOB: 1985-09-17 Today's Date: 02/23/2016    History of Present Illness 30 yo female admitted with diplopia, vertigo and gait changes. MRI (+) demylinating 5mm lesion t3-4 PMH: MS L hip pain, SVD x5, depression    Clinical Impression   PT admitted with MS flareup. Pt currently with functional limitiations due to the deficits listed below (see OT problem list). PTA living in hotel with multiple children.  Pt will benefit from skilled OT to increase their independence and safety with adls and balance to allow discharge CIR.     Follow Up Recommendations  CIR    Equipment Recommendations  Other (comment) (TBA)    Recommendations for Other Services Rehab consult     Precautions / Restrictions Precautions Precautions: Fall Restrictions Weight Bearing Restrictions: No      Mobility Bed Mobility Overal bed mobility: Needs Assistance Bed Mobility: Supine to Sit;Sit to Supine     Supine to sit: Min guard Sit to supine: Min guard   General bed mobility comments: using bed rail to elevate trunk  Transfers Overall transfer level: Needs assistance   Transfers: Sit to/from Stand Sit to Stand: Min assist         General transfer comment: reaching for environmental support    Balance Overall balance assessment: Needs assistance Sitting-balance support: Bilateral upper extremity supported;Feet supported Sitting balance-Leahy Scale: Poor     Standing balance support: Bilateral upper extremity supported;During functional activity Standing balance-Leahy Scale: Poor                              ADL Overall ADL's : Needs assistance/impaired   Eating/Feeding Details (indicate cue type and reason): declined to eat due to dizziness and nausea         Lower Body Bathing: Moderate assistance;Sit to/from stand           Toilet Transfer: Minimal assistance Toilet  Transfer Details (indicate cue type and reason): reaching for environmental support of therapist but able to make transfer without (A) to shift weight Toileting- Clothing Manipulation and Hygiene: Min guard;Sit to/from stand Toileting - Clothing Manipulation Details (indicate cue type and reason): static standing for peri care       General ADL Comments: Pt currently unable to tolerate EOB sitting and falls back into the bed stating "my head" Pt describes the room as moving like a boat. pt states she could void on 3n1. Pt transfered to 3n1 and sitting for 10 minutes with incr symptoms. pt requires room dark due to light sensitive. Difficult to assess vision due to so dark in room. OT attempting to use light on phone and pt keeping eyes closed so unable to assess      Vision Vision Assessment?: Vision impaired- to be further tested in functional context   Perception     Praxis      Pertinent Vitals/Pain Pain Assessment: Faces Pain Score: 10-Worst pain ever Faces Pain Scale: Hurts worst Pain Location: head Pain Descriptors / Indicators: Headache (spinning) Pain Intervention(s): Monitored during session;Premedicated before session;Repositioned     Hand Dominance Right   Extremity/Trunk Assessment Upper Extremity Assessment Upper Extremity Assessment: Generalized weakness   Lower Extremity Assessment Lower Extremity Assessment: Generalized weakness   Cervical / Trunk Assessment Cervical / Trunk Assessment: Normal   Communication Communication Communication: No difficulties   Cognition Arousal/Alertness: Awake/alert Behavior During Therapy: WFL for tasks assessed/performed Overall Cognitive Status:  Within Functional Limits for tasks assessed                     General Comments       Exercises       Shoulder Instructions      Home Living Family/patient expects to be discharged to:: Other (Comment) (homeless) Living Arrangements: Spouse/significant  other;Children                           Home Equipment: Environmental consultant - 2 wheels;Walker - 4 wheels   Additional Comments: paying for a hotel room for ~ 5months, multiple children within apartment. Oldest 54 yo and youngest is 31 yo.       Prior Functioning/Environment Level of Independence: Independent        Comments: spouse helps with bathing/ dressing and movement to chair     OT Diagnosis: Generalized weakness;Acute pain   OT Problem List: Decreased strength;Decreased activity tolerance;Impaired balance (sitting and/or standing);Decreased safety awareness;Decreased knowledge of precautions;Decreased knowledge of use of DME or AE;Pain   OT Treatment/Interventions: Self-care/ADL training;Therapeutic exercise;Neuromuscular education;DME and/or AE instruction;Therapeutic activities;Patient/family education;Balance training;Visual/perceptual remediation/compensation    OT Goals(Current goals can be found in the care plan section) Acute Rehab OT Goals Patient Stated Goal: to return to work OT Goal Formulation: With patient Time For Goal Achievement: 03/08/16 Potential to Achieve Goals: Good  OT Frequency: Min 3X/week   Barriers to D/C:            Co-evaluation              End of Session Nurse Communication: Mobility status;Precautions  Activity Tolerance: Patient limited by pain;Patient limited by fatigue Patient left: in bed;with call bell/phone within reach;with family/visitor present   Time: 1120-1145 (1610-9604) OT Time Calculation (min): 25 min Charges:  OT General Charges $OT Visit: 1 Procedure OT Evaluation $OT Eval Moderate Complexity: 1 Procedure OT Treatments $Self Care/Home Management : 8-22 mins G-Codes:    Boone Master B Mar 22, 2016, 12:10 PM  Mateo Flow   OTR/L Pager: [phone] Office: [phone] .

## 2016-02-23 NOTE — Progress Notes (Signed)
OT NOTE  Patient requesting to speak to someone regarding housing and community resources. Pt has children within the hotel and lives with spouse. Spouse works during the day. Pt did not state directly the number of children in the home. Patients mother in the room states "i can't believe I am going to have a 31 yo [place] already" Pt works as Lawyer but currently with MS flareup  Community needs: Housing Medication Transportation DME   Alicia Barker   OTR/L Pager: [phone] Office: [phone] .

## 2016-02-23 NOTE — Evaluation (Signed)
Physical Therapy Evaluation Patient Details Name: Alicia Barker MRN: [id number] DOB: 1986-05-24 Today's Date: 02/23/2016   History of Present Illness  30 yo female admitted with diplopia, vertigo and gait changes. MRI (+) demylinating 5mm lesion t3-4 PMH: MS L hip pain, SVD x5, depression   Clinical Impression  Pt admitted with/for MS exacerbation.  Pt currently limited functionally due to the problems listed. ( See problems list.)   Pt will benefit from PT to maximize function and safety in order to get ready for next venue listed below.     Follow Up Recommendations CIR    Equipment Recommendations  None recommended by PT    Recommendations for Other Services       Precautions / Restrictions Precautions Precautions: Fall      Mobility  Bed Mobility Overal bed mobility: Needs Assistance Bed Mobility: Supine to Sit;Sit to Supine     Supine to sit: Min guard Sit to supine: Min guard   General bed mobility comments: using bed rail to elevate trunk  Transfers Overall transfer level: Needs assistance   Transfers: Sit to/from Stand Sit to Stand: Min assist         General transfer comment: reaching for environmental support  Ambulation/Gait             General Gait Details: not able today  Stairs            Wheelchair Mobility    Modified Rankin (Stroke Patients Only)       Balance Overall balance assessment: Needs assistance Sitting-balance support: Bilateral upper extremity supported Sitting balance-Leahy Scale: Poor Sitting balance - Comments: reliant on UE   Standing balance support: Bilateral upper extremity supported Standing balance-Leahy Scale: Poor                               Pertinent Vitals/Pain Pain Assessment: Faces Pain Score: 10-Worst pain ever Faces Pain Scale: Hurts worst Pain Location: head Pain Descriptors / Indicators: Headache Pain Intervention(s): Monitored during session    Home Living  Family/patient expects to be discharged to:: Other (Comment) (homeless) Living Arrangements: Spouse/significant other;Children             Home Equipment: Environmental consultant - 2 wheels;Walker - 4 wheels Additional Comments: paying for a hotel room for ~ 5months, multiple children within apartment. Oldest 8 yo and youngest is 31 yo.     Prior Function Level of Independence: Independent         Comments: spouse helps with bathing/ dressing and movement to chair      Hand Dominance   Dominant Hand: Right    Extremity/Trunk Assessment   Upper Extremity Assessment: Generalized weakness           Lower Extremity Assessment: Generalized weakness      Cervical / Trunk Assessment: Normal  Communication   Communication: No difficulties  Cognition Arousal/Alertness: Awake/alert Behavior During Therapy: WFL for tasks assessed/performed Overall Cognitive Status: Within Functional Limits for tasks assessed                      General Comments      Exercises        Assessment/Plan    PT Assessment Patient needs continued PT services  PT Diagnosis Generalized weakness;Other (comment) (dizziness/veertigo.)   PT Problem List Decreased strength;Decreased activity tolerance;Decreased balance;Decreased coordination;Decreased mobility  PT Treatment Interventions DME instruction;Gait training;Functional mobility training;Therapeutic activities;Balance training;Patient/family education   PT  Goals (Current goals can be found in the Care Plan section) Acute Rehab PT Goals Patient Stated Goal: to return to work PT Goal Formulation: With patient Time For Goal Achievement: 03/08/16 Potential to Achieve Goals: Good    Frequency Min 3X/week   Barriers to discharge        Co-evaluation               End of Session   Activity Tolerance: Patient tolerated treatment well Patient left: in bed;with call bell/phone within reach;with family/visitor present Nurse  Communication: Mobility status         Time: 1125-1150 PT Time Calculation (min) (ACUTE ONLY): 25 min   Charges:   PT Evaluation $PT Eval Moderate Complexity: 1 Procedure     PT G Codes:        Staphany Ditton, Eliseo Gum 02/23/2016, 1:13 PM 02/23/2016  Burden Bing, PT [phone]  (pager)

## 2016-02-23 NOTE — Discharge Summary (Signed)
Family Medicine Teaching [facility] Discharge Summary  Patient name: Alicia Barker Medical record number: [id number] Date of birth: [birth year] Age: 30 y.o. Gender: female Date of Admission: 02/22/2016  Date of Discharge: 02/25/16 Admitting Physician: Carney Living, MD  Primary Care Provider: Kathreen Cosier, MD Consultants: Neuro  Indication for Hospitalization: MS exacerbation  Discharge Diagnoses/Problem List:  Patient Active Problem List   Diagnosis Date Noted  . Multiple sclerosis exacerbation (HCC) 02/22/2016  . Weakness   . Diplopia   . Left hip pain 01/18/2016  . Left sided sciatica 01/18/2016  . Numbness 11/28/2015  . Ataxia 11/28/2015  . Visual disturbance 11/28/2015  . Other fatigue 11/28/2015  . Urinary frequency 11/28/2015  . High risk medication use 11/28/2015  . Chest pain 11/25/2015  . 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 Dizziness   . Multiple sclerosis (HCC) 11/15/2015     Disposition: Home with home PT/OT/Social work   Discharge Condition: Stable, improved  Discharge Exam:  General: lying down in bed, NAD Cardiovascular: RRR, s1/s1, no MRG Respiratory: CTABL, normal work of breathing Abdomen: soft, non-tender, non-distended MSK: no edema, no focal weakness, full ROM Extremities: warm and well perfused, no cyanosis or edema Neuro: AAOx3 Psych: normal mood and affect  Brief Hospital Course:  Alicia Barker is a 30 y.o. female with a PMH significant for MS presenting weakness, gait instability, vertigo and diplopia. She is currently on Tecfidera and was doing well until two days prior to admission.   Weakness with associated gait instability, diplopia and vertigo suggestive of MS exacerbation: The patient noted feeling unsteady on her feet and some subjective weakness in her R>L lower extremity that has worsened over the past 24 hours. She has associated diplopia and vertigo with some vomiting. These were also the presenting  symptoms she had a few months ago when she was diagnosed with MS. She was told to return if she became symptomatic. Seen by Neurology in the ED who recommend admitting her and starting on methylprednisone  Q12hrs for 6 doses total and continuing her Tecfidera BID. She was also seen by PT/OT who recommended CIR, but patient preferred home PT/OT.  Ultimately cleared by Neurology and was discharged with a plan to follow up with primary doctor on 03/07/16 at 1:30PM.    Vomiting: Patient has had 4 episodes of vomiting in the past 48 hours mostly associated with vertigo and diplopia. Very likely a symptom of her MS exacerbation. She denies any sick contacts. Nausea appears to have resolved, but still endorses symptoms of vertigo. Has not had any vomiting since being admitted, but is written for zofran  Q6hr prn.  This improved over the course of the admission.   Social: Patient currently homeless, living in hotel with husband and 5 kids.  Having difficulties keeping a job with her chronic condition and requested help finding housing.  Seen by social work who wrote the patient a note documenting homelessness and chronic medical condition.  Hopefully this will help expedite the process.   Issues for Follow Up:  1. Multiple sclerosis- Needs to have a follow up visit with Neurologist.  2. Hospital follow up- Has a follow up with myself on 03/07/16 at 1:30PM for general medical follow up.    Significant Procedures: None  Significant Labs and Imaging:   Recent Labs Lab 02/22/16 1716 02/22/16 2257  WBC 5.7 7.6  HGB 12.6 12.3  HCT 37.0 36.6  PLT 246 277    Recent Labs Lab 02/22/16 1716  02/23/16 0553  NA 137 137  K 3.3* 3.9  CL 106 104  CO2 21* 23  GLUCOSE 117* 167*  BUN 8 10  CREATININE 0.80 0.69  CALCIUM 9.4 9.5  ALKPHOS 57  --   AST 22  --   ALT 21  --   ALBUMIN 4.1  --       Results/Tests Pending at Time of Discharge: None  Discharge Medications:    Medication List     TAKE these medications        acetaminophen-codeine 300-30 MG tablet  Commonly known as:  TYLENOL #3  Take 1 tablet by mouth every 8 (eight) hours as needed for moderate pain.     cyclobenzaprine 5 MG tablet  Commonly known as:  FLEXERIL  Take 1 tablet (5 mg total) by mouth at bedtime.     Dimethyl Fumarate 240 MG Cpdr  Take 240 mg by mouth 2 (two) times daily.     meclizine 25 MG tablet  Commonly known as:  ANTIVERT  Take 1 tablet (25 mg total) by mouth 3 (three) times daily as needed for dizziness.     meloxicam 15 MG tablet  Commonly known as:  MOBIC  Take 1 tablet (15 mg total) by mouth daily.     methylPREDNISolone 4 MG tablet  Commonly known as:  MEDROL  Take 6 pills po x 1 d, then 5 po x 1 day, then 4 po x 1d, then 3 po x 1d, then 2 po x 1d, then 1 po x 1d     nitrofurantoin (macrocrystal-monohydrate) 100 MG capsule  Commonly known as:  MACROBID  Take 1 capsule (100 mg total) by mouth 2 (two) times daily.     omeprazole 20 MG capsule  Commonly known as:  PRILOSEC  Take 1 capsule (20 mg total) by mouth daily.     ondansetron 4 MG tablet  Commonly known as:  ZOFRAN  Take 1 tablet (4 mg total) by mouth every 6 (six) hours. Prn nausea or vomiting     ranitidine 150 MG tablet  Commonly known as:  ZANTAC  Take 1 tablet (150 mg total) by mouth 2 (two) times daily.     traMADol 50 MG tablet  Commonly known as:  ULTRAM  Take 1 tablet (50 mg total) by mouth every 8 (eight) hours as needed.     Vitamin D (Ergocalciferol) 50000 units Caps capsule  Commonly known as:  DRISDOL  Take 1 capsule (50,000 Units total) by mouth every 7 (seven) days.        Discharge Instructions: Please refer to Patient Instructions section of EMR for full details.  Patient was counseled important signs and symptoms that should prompt return to medical care, changes in medications, dietary instructions, activity restrictions, and follow up appointments.   Follow-Up Appointments: Follow-up  Information    Follow up with Renne Musca, MD. Go on 03/07/2016.   Why:  @ 1:30 PM   Contact information:   [address] [place] [address] [phone]       Renne Musca, MD 02/26/2016, 4:07 PM PGY-1, [facility]

## 2016-02-23 NOTE — Progress Notes (Signed)
Rehab Admissions Coordinator Note:  Patient was screened by Clois Dupes for appropriateness for an Inpatient Acute Rehab Consult per PT and OT recommendations.  At this time, we are recommending await further progress over the weekend  as she received solumedrol to see how she progresses functionally with therapy before determining rehab venue needs. I will follow up Monday with her progress.   Clois Dupes 02/23/2016, 1:34 PM  I can be reached at [phone].

## 2016-02-23 NOTE — Progress Notes (Signed)
Family Medicine Teaching Service Daily Progress Note Intern Pager: [phone]  Patient name: Alicia Barker Medical record number: [id number] Date of birth: 09-20-1985 Age: 30 y.o. Gender: female  Primary Care Provider: Kathreen Cosier, MD Consultants: Neuro Code Status: Full  Pt Overview and Major Events to Date:  02/22/16:  Admitted to FMTS under attending Chambliss  Assessment and Plan: Alicia Barker is a 30 y.o. female with a PMH significant for MS presenting weakness, gait instability, vertigo and diplopia. She is currently on Tecfidera and was doing well until two days prior to admission.   Weakness with associated gait instability, diplopia and vertigo suggestive of MS exacerbation: The patient noted feeling unsteady on her feet and some subjective weakness in her R>L lower extremity that has worsened over the past 24 hours. She has associated diplopia and vertigo with some vomiting. These were also the presenting symptoms she had a few months ago when she was diagnosed with MS. She was told to return if she became symptomatic. Seen by Neurology, recommend starting on methylprednisone 500mg  Q12hrs and continuing her Tecfidera BID. Unclear if plans to reimage, will defer to Neurology. Last MRIs in 4/17. Also recommend PT/OT consult.  - solumedrol 500mg  BID x6 doses - cont meclizine PRN - cont dimethyl fumarate 240mg  BID - f/u neuro recs - possible reimaging - zofran 4mg  q6hr PRN for nausea - f/u PT/OT consult   Vomiting: Patient has had 4 episodes of vomiting in the past 48 hours mostly associated with vertigo and diplopia. Very likely a symptom of her MS exacerbation. She denies any sick contacts. Nausea appears to have resolved, but still endorses symptoms of vertigo.  - zofran 4mg  Q6hr prn - maintenance fluids as appropriate, but currently tolerating PO well  FEN/GI: regular diet, protonix Prophylaxis: lovenox  Disposition: pending medical  improvement  Subjective:  This morning the the patient states that she feels improved and denies any nausea or vomiting overnight.  She has not had any double vision, but continues to experience symptoms of vertigo.   Objective: Temp:  [97.6 F (36.4 C)-98 F (36.7 C)] 98 F (36.7 C) (07/14 0627) Pulse Rate:  [47-75] 67 (07/14 0627) Resp:  [16-22] 16 (07/14 0627) BP: (103-121)/(60-78) 103/60 mmHg (07/14 0627) SpO2:  [99 %-100 %] 100 % (07/14 0627) Weight:  [199 lb 11.8 oz (90.6 kg)] 199 lb 11.8 oz (90.6 kg) (07/13 2245) Physical Exam: General: lying down in bed, NAD Cardiovascular: RRR, s1/s1, no MRG Respiratory: CTABL, normal work of breathing Abdomen: soft, non-tender, non-distended MSK: no edema, no focal weakness, full ROM Extremities: warm and well perfused, no cyanosis or edema Neuro: AAOx3 Psych: normal mood and affect  Laboratory:  Recent Labs Lab 02/22/16 1716 02/22/16 2257  WBC 5.7 7.6  HGB 12.6 12.3  HCT 37.0 36.6  PLT 246 277    Recent Labs Lab 02/22/16 1716 02/23/16 0553  NA 137 137  K 3.3* 3.9  CL 106 104  CO2 21* 23  BUN 8 10  CREATININE 0.80 0.69  CALCIUM 9.4 9.5  PROT 7.2  --   BILITOT 0.8  --   ALKPHOS 57  --   ALT 21  --   AST 22  --   GLUCOSE 117* 167*     Imaging/Diagnostic Tests:   Renne Musca, MD 02/23/2016, 9:10 AM PGY-1, [place] FPTS Intern pager: [phone], text pages welcome

## 2016-02-24 LAB — HEMOGLOBIN A1C
HEMOGLOBIN A1C: 5.3 % (ref 4.8–5.6)
MEAN PLASMA GLUCOSE: 105 mg/dL

## 2016-02-24 LAB — GLUCOSE, CAPILLARY: Glucose-Capillary: 137 mg/dL — ABNORMAL HIGH (ref 65–99)

## 2016-02-24 NOTE — Progress Notes (Signed)
Family Medicine Teaching Service Daily Progress Note Intern Pager: [phone]  Patient name: Alicia Barker Medical record number: [id number] Date of birth: 1986-02-01 Age: 30 y.o. Gender: female  Primary Care Provider: Kathreen Cosier, MD Consultants: Neuro Code Status: Full  Pt Overview and Major Events to Date:  02/22/16:  Admitted to FMTS under attending Chambliss  Assessment and Plan: Alicia Barker is a 30 y.o. female with a PMH significant for MS presenting weakness, gait instability, vertigo and diplopia. She is currently on Tecfidera and was doing well until two days prior to admission.   Weakness with associated gait instability, diplopia and vertigo suggestive of MS exacerbation: The patient noted feeling unsteady on her feet and some subjective weakness in her R>L lower extremity that has worsened over the past 24 hours. She has associated diplopia and vertigo with some vomiting. These were also the presenting symptoms she had a few months ago when she was diagnosed with MS. She was told to return if she became symptomatic. Seen by Neurology, recommend starting on methylprednisone 500mg  Q12hrs for total of 6 doses and continuing her Tecfidera BID. Will receive 4th dose this AM.  PT/OT recommend CIR.  CIR planning to evaluate on Monday.  Patient does not want CIR.  Will look into home PT/OT.  - solumedrol 500mg  BID x6 doses - cont meclizine PRN - cont dimethyl fumarate 240mg  BID - f/u neuro recs - zofran 4mg  q6hr PRN for nausea  Vomiting: Patient has had 4 episodes of vomiting in the past 48 hours mostly associated with vertigo and diplopia. Very likely a symptom of her MS exacerbation. She denies any sick contacts. Nausea appears to have resolved, but still endorses symptoms of vertigo. Stable - zofran 4mg  Q6hr prn - maintenance fluids as appropriate, but continuing to tolerate PO well.   FEN/GI: regular diet, protonix Prophylaxis: lovenox  Disposition: pending  medical improvement  Subjective:  This morning the the patient states that she feels improved and denies any nausea or vomiting overnight.  She has not had any double vision, but continues to experience symptoms of vertigo.   Objective: Temp:  [98.1 F (36.7 C)-98.7 F (37.1 C)] 98.5 F (36.9 C) (07/15 0619) Pulse Rate:  [48-69] 48 (07/15 0619) Resp:  [18-20] 20 (07/15 0619) BP: (110-119)/(56-73) 115/70 mmHg (07/15 0619) SpO2:  [98 %-100 %] 100 % (07/15 2122) Physical Exam: General: lying down in bed, NAD Cardiovascular: RRR, s1/s1, no MRG Respiratory: CTABL, normal work of breathing Abdomen: soft, non-tender, non-distended MSK: no edema, no focal weakness, full ROM Extremities: warm and well perfused, no cyanosis or edema Neuro: AAOx3 Psych: normal mood and affect  Laboratory:  Recent Labs Lab 02/22/16 1716 02/22/16 2257  WBC 5.7 7.6  HGB 12.6 12.3  HCT 37.0 36.6  PLT 246 277    Recent Labs Lab 02/22/16 1716 02/23/16 0553  NA 137 137  K 3.3* 3.9  CL 106 104  CO2 21* 23  BUN 8 10  CREATININE 0.80 0.69  CALCIUM 9.4 9.5  PROT 7.2  --   BILITOT 0.8  --   ALKPHOS 57  --   ALT 21  --   AST 22  --   GLUCOSE 117* 167*     Imaging/Diagnostic Tests:   Renne Musca, MD 02/24/2016, 6:43 AM PGY-1,  Family Medicine FPTS Intern pager: [phone], text pages welcome

## 2016-02-24 NOTE — Progress Notes (Signed)
Subjective:  Alicia Barker notes she is feeling a bit improved today.  She is hoping to go home Monday.  She was able to perform some PT this am.  Exam: Filed Vitals:   02/24/16 0619 02/24/16 0958  BP: 115/70 107/61  Pulse: 48 62  Temp: 98.5 F (36.9 C) 98.4 F (36.9 C)  Resp: 20 18    HEENT-  Normocephalic, no lesions, without obvious abnormality.  Normal external eye and conjunctiva.  Normal TM's bilaterally.  Normal auditory canals and external ears. Normal external nose, mucus membranes and septum.  Normal pharynx. Cardiovascular- regular rate and rhythm, S1, S2 normal, no murmur, click, rub or gallop, pulses palpable throughout   Lungs- chest clear, no wheezing, rales, normal symmetric air entry, Heart exam - S1, S2 normal, no murmur, no gallop, rate regular Abdomen- soft, non-tender; bowel sounds normal; no masses,  no organomegaly Extremities- less then 2 second capillary refill Lymph-no adenopathy palpable Musculoskeletal-no joint tenderness, deformity or swelling Skin-warm and dry, no hyperpigmentation, vitiligo, or suspicious lesions    Gen: In bed, NAD MS: alert, oriented ZO:XWRU bilateral nystagmus Motor: wnl Sensory:wnl DTR:wnl    Pertinent Labs/Diagnostics: reviewed    Impression:   Mild MS exacerbation, now improved on steroid therapy.  We also discussed that one option for mild MS exacerbations is outpatient solumedrol infusion therapy which might actually be more convenient for Alicia Barker and she would not have to be away from her five lovely children due to a hospitalization.  She was encouraged to discuss this option with her neurologist for future exacerbations.   Recommendations: 1) [name] seems to be doing well, likely ready for discharge on Monday.   Alicia Barker A. Hilda Blades, M.D. Neurohospitalist Phone: [phone]   02/24/2016, 10:36 AM

## 2016-02-25 LAB — GLUCOSE, CAPILLARY: GLUCOSE-CAPILLARY: 145 mg/dL — AB (ref 65–99)

## 2016-02-25 MED ORDER — NITROFURANTOIN MONOHYD MACRO 100 MG PO CAPS
100.0000 mg | ORAL_CAPSULE | Freq: Two times a day (BID) | ORAL | Status: AC
Start: 2016-02-25 — End: 2016-03-03

## 2016-02-25 MED ORDER — SODIUM CHLORIDE 0.9 % IV SOLN
500.0000 mg | Freq: Once | INTRAVENOUS | Status: AC
  Administered 2016-02-25: 500 mg via INTRAVENOUS
  Filled 2016-02-25: qty 4

## 2016-02-25 NOTE — Discharge Instructions (Signed)
You were admitted to the hospital with gait instability, double vision and vertigo due to a flair of your multiple sclerosis.  You were given nausea medication, your daily MS medication and some IV steroids over the course of the past few days.  We had you evaluated by physical therapy and occupational therapy and they both recommend that you receive PT/OT services at least 3x per week.  I have put in an order for you to receive these services.  Additionally, you should follow up with your Neurologist in the outpatient setting.  I have also set up a follow up appointment with myself to be your primary doctor and I look forward to seeing you.

## 2016-02-25 NOTE — Progress Notes (Signed)
Pt being discharged from hospital per orders from MD. Pt educated on discharge instructions. Pt verbalized understanding of instructions. All questions and concerns were addressed. Pt's IV was removed before discharge. Pt exited hospital via wheelchair. 

## 2016-02-25 NOTE — Progress Notes (Signed)
Subjective:  Alicia Barker appears to be doing well from a neurological perspective. She was able to sit on the edge of the bed without assistance. She was able to stand and ambulate without assistance. She was noted to have a mildly unsteady gait. [name] was seen in association with the hospitalist during today's visit.  Exam: Filed Vitals:   02/25/16 0209 02/25/16 0602  BP: 119/57 118/67  Pulse: 78 91  Temp: 98 F (36.7 C) 98 F (36.7 C)  Resp: 18 18                         HEENT- Normocephalic, no lesions, without obvious abnormality. Normal external eye and conjunctiva. Normal TM's bilaterally. Normal auditory canals and external ears. Normal external nose, mucus membranes and septum. Normal pharynx. Cardiovascular- regular rate and rhythm, S1, S2 normal, no murmur, click, rub or gallop, pulses palpable throughout  Lungs- chest clear, no wheezing, rales, normal symmetric air entry, Heart exam - S1, S2 normal, no murmur, no gallop, rate regular Abdomen- soft, non-tender; bowel sounds normal; no masses, no organomegaly Extremities- less then 2 second capillary refill Lymph-no adenopathy palpable Musculoskeletal-no joint tenderness, deformity or swelling Skin-warm and dry, no hyperpigmentation, vitiligo, or suspicious lesions   Neurological exam: Gen: In bed, NAD MS: alert, oriented GY:IRSW bilateral nystagmus Motor: wnl Sensory:wnl DTR:wnl  Pertinent Labs/Diagnostics: reviewed  Impression:   [name] appears to be doing well from a neurological perspective. Her exam appears stable. She was able to get out of the bed and ambulate without any significant assistance. [name] reports that she continues to have some difficulty with her vision and does not feel that she will be able to go home on Monday. The rehabilitation service is working with her at this time. [name] reports that she actually lives in a hotel room with her 5 children and her  husband.  Recommendations: 1) [name] seems to be doing well, likely ready for discharge as per the rehabilitation service and hospital team.          Rudy Jew. Hilda Blades, M.D. Neurohospitalist Phone: [phone]   02/25/2016, 9:31 AM

## 2016-02-25 NOTE — Clinical Social Work Note (Signed)
Clinical Social Work Assessment  Patient Details  Name: Alicia Barker MRN: [id number] Date of Birth: 01-17-1986  Date of referral:  02/25/16               Reason for consult:  Housing Concerns/Homelessness                Permission sought to share information with:   RNCM Permission granted to share information::  No   Housing/Transportation Living arrangements for the past 2 months:  Hotel/Motel Source of Information:  Patient Patient Interpreter Needed:  None Criminal Activity/Legal Involvement Pertinent to Current Situation/Hospitalization:  No - Comment as needed Significant Relationships:  Spouse, Dependent Children, Parents, Other Family Members Lives with:  Spouse, Other (Comment) (Dependent Children ) Do you feel safe going back to the place where you live?  Yes Need for family participation in patient care:  Yes (Comment)  Care giving concerns:  Clinical Social Worker received call from MD (resident) indicating patient was homeless and needed resources in regards to housing programs and short-term disability.    Social Worker assessment / plan:  CSW met with patient at bedside. Pt's mother present and CSW granted permission to complete assessment with mother present. CSW confirmed information provided by MD. Patient reported that currently she is homeless and further stated that she, her husband and their 5 children having been living in an Extended Stay hotel since March 2017. Pt reported that she applied for Section 8 in Feb 2017. CSW explained possible Section 8 waiting list of about 2 years. Pt expressed understanding and also reported that she has applied for low-income housing with [facility]. Pt stated that she has a friend who's application was expedited and approved due to homelessness. Pt believes that if letter is provided on her behalf, that her application will also be expedited and approved. CSW has provided letter to patient indicating that she  is in fact homeless and with a chronic medical condition. Patient reported that due to her illness she has been unable to maintain employment due to [place]. Patient reported losing her job and husband losing his job shortly after which has resulted in homelessness. Pt's husband currently employed.   Patient plans to discharge/recover at her aunt's home. Husband and children will remain at Extended Stay. Patient seems to have a large family with good family support however has also expressed that she has reached out to shelters in the past as well. Husband and children currently at bedside.   CSW has provided letter, and information for short-term disability. No further concerns reported at this time. Patient has been d/c'ed. CSW will sign off for now as social work intervention is no longer needed. Please consult [place] again if new need arises.  Employment status:  Unemployed Forensic scientist:  Medicaid In [place] PT Recommendations:  Inpatient Rehab Consult Information / Referral to community resources:   (None )  Patient/Family's Response to care: Pt a/o x4. Pt stated she plans to start process for short term disability application and continue to follow up with Section 8 housing application and waiting list. Pt appreciated social work intervention.   Patient/Family's Understanding of and Emotional Response to Diagnosis, Current Treatment, and Prognosis:  Patient and family aware of diagnosis and post-acute follow up care with PCP.   Emotional Assessment Appearance:  Appears stated age Attitude/Demeanor/Rapport:   (Polite ) Affect (typically observed):  Pleasant, Appropriate Orientation:  Oriented to Situation, Oriented to  Time, Oriented to Place, Oriented to Self  Alcohol / Substance use:  Not Applicable Psych involvement (Current and /or in the community):  No (Comment)  Discharge Needs  Concerns to be addressed:  Homelessness Readmission within the last 30 days:  No Current discharge risk:   Homeless Barriers to Discharge:  No Barriers Identified   Glendon Axe, MSW, LCSWA [phone] 02/25/2016 4:29 PM

## 2016-02-25 NOTE — Progress Notes (Signed)
Family Medicine Teaching Service Daily Progress Note Intern Pager: [phone]  Patient name: Alicia Barker Medical record number: [id number] Date of birth: 1985-12-23 Age: 30 y.o. Gender: female  Primary Care Provider: Kathreen Cosier, MD Consultants: Neuro Code Status: Full  Pt Overview and Major Events to Date:  02/22/16:  Admitted to FMTS under attending Chambliss  Assessment and Plan: Alicia Barker is a 30 y.o. female with a PMH significant for MS presenting weakness, gait instability, vertigo and diplopia. She is currently on Tecfidera and was doing well until two days prior to admission.   Weakness with associated gait instability, diplopia and vertigo suggestive of MS exacerbation: The patient noted feeling unsteady on her feet and some subjective weakness in her R>L lower extremity that has worsened over the past 24 hours. She has associated diplopia and vertigo with some vomiting. These were also the presenting symptoms she had a few months ago when she was diagnosed with MS. She was told to return if she became symptomatic. Seen by Neurology, recommend starting on methylprednisone  Q12hrs for total of 6 doses and continuing her Tecfidera BID. Will receive 6th dose this AM.  PT/OT recommend CIR.  Will look into home PT/OT.  Patient needs social work c/s for help finding housing and possibility of disability. Currently in hotel with 5 kids and husband.  Plan to discharge today. - solumedrol  BID x6 doses - cont meclizine PRN - cont dimethyl fumarate  BID - f/u neuro recs - zofran  q6hr PRN for nausea  Vomiting: Patient has had 4 episodes of vomiting in the past 48 hours mostly associated with vertigo and diplopia. Very likely a symptom of her MS exacerbation. She denies any sick contacts. Nausea appears to have resolved, but still endorses symptoms of vertigo. Stable - zofran  Q6hr prn - maintenance fluids as appropriate, but continuing to tolerate  PO well.   FEN/GI: regular diet, protonix Prophylaxis: lovenox  Disposition: Discharge with home PT/OT  Subjective:  This morning the the patient states that she feels improved and is ready to go home.  She does not want to stay here for CIR, but is amenable to home PT/OT.  Concerned about housing situation.   Objective: Temp:  [98 F (36.7 C)-98.2 F (36.8 C)] 98 F (36.7 C) (07/16 0602) Pulse Rate:  [50-91] 91 (07/16 0602) Resp:  [18] 18 (07/16 0602) BP: (110-123)/(56-67) 118/67 mmHg (07/16 0602) SpO2:  [98 %-100 %] 98 % (07/16 0602) Physical Exam: General: lying down in bed, NAD Cardiovascular: RRR, s1/s1, no MRG Respiratory: CTABL, normal work of breathing Abdomen: soft, non-tender, non-distended MSK: no edema, no focal weakness, full ROM Extremities: warm and well perfused, no cyanosis or edema Neuro: AAOx3 Psych: normal mood and affect  Laboratory:  Recent Labs Lab 02/22/16 1716 02/22/16 2257  WBC 5.7 7.6  HGB 12.6 12.3  HCT 37.0 36.6  PLT 246 277    Recent Labs Lab 02/22/16 1716 02/23/16 0553  NA 137 137  K 3.3* 3.9  CL 106 104  CO2 21* 23  BUN 8 10  CREATININE 0.80 0.69  CALCIUM 9.4 9.5  PROT 7.2  --   BILITOT 0.8  --   ALKPHOS 57  --   ALT 21  --   AST 22  --   GLUCOSE 117* 167*     Imaging/Diagnostic Tests:   Renne Musca, MD 02/25/2016, 10:53 AM PGY-1, [facility] FPTS Intern pager: [phone], text pages welcome

## 2016-02-25 NOTE — Progress Notes (Signed)
FMTS Interim Progress Note.  Prior to discharge patient inquired about results of UA and if she needed to be treated. Patient reported that she had dysuria and increased frequency for several days. UA on admission with moderate leukocytes. Given symptoms and positive UA will treat for presumed UA with 7 days of macrobid. Will send for culture. Instructed patient to follow up in clinic if symptoms not improving.   Katina Degree. Jimmey Ralph, MD [facility] Resident PGY-3 02/25/2016 5:05 PM

## 2016-02-27 ENCOUNTER — Encounter: Payer: Self-pay | Admitting: Licensed Clinical Social Worker

## 2016-02-27 ENCOUNTER — Encounter: Payer: Self-pay | Admitting: Neurology

## 2016-02-27 NOTE — Progress Notes (Signed)
Patient ID: Alicia Barker, female   DOB: [birth year], 30 y.o.   MRN: [id number]   CSW consult from Dr. Myrtie Soman ref. homeless concerns after discharge from hospital. Inpatient CSW provided patient with letter to support her housing needs for her section 8 application, MD also order social work support with home health.   Follow up call to patient. Called [phone] (number in chart) was informed that patient's phone number is [phone].  Left message for patient to call CSW in reference to following up. Called twice Plan: CSW will wait for return call from patient, to see if additional support is needed to assist with obtaining housing.   Alicia Hines, LCSW Licensed Clinical Social Worker [facility]   [phone] 3:28 PM

## 2016-02-28 ENCOUNTER — Other Ambulatory Visit: Payer: Self-pay | Admitting: Family Medicine

## 2016-02-28 ENCOUNTER — Telehealth: Payer: Self-pay | Admitting: Neurology

## 2016-02-29 ENCOUNTER — Telehealth: Payer: Self-pay | Admitting: Neurology

## 2016-02-29 NOTE — Telephone Encounter (Signed)
Pt returned call. Would like call today. She thinks she may go to ER.

## 2016-02-29 NOTE — Telephone Encounter (Signed)
Patient requesting refill of meclizine (ANTIVERT) 25 MG tablet  , ondansetron (ZOFRAN) 4 MG tablet - pt is complaining of nausea and dizziness Pharmacy: [id number] - [place], [place] - [address]

## 2016-02-29 NOTE — Telephone Encounter (Signed)
LMTC./fim 

## 2016-03-01 MED ORDER — MECLIZINE HCL 25 MG PO TABS: 25.0000 mg | ORAL_TABLET | Freq: Three times a day (TID) | ORAL | Status: AC | PRN

## 2016-03-01 NOTE — Telephone Encounter (Signed)
PC with Kieryn this am.  She reports being hospitalized last week for exacerbation of MS sx--dizziness, balance, blurry vision.  Sts. received 4 days of IV steroids.  Appt. given 03-04-16 at 0840. Meclizine escribed to [facility] per request./fim

## 2016-03-01 NOTE — Telephone Encounter (Signed)
Patient called to check status of meclizine (ANTIVERT) 25 MG tablet refill, please call [phone].

## 2016-03-01 NOTE — Addendum Note (Signed)
Addended by: Candis Schatz I on: 03/01/2016 10:01 AM   Modules accepted: Orders

## 2016-03-04 ENCOUNTER — Ambulatory Visit (INDEPENDENT_AMBULATORY_CARE_PROVIDER_SITE_OTHER): Payer: Medicaid Other | Admitting: Neurology

## 2016-03-04 ENCOUNTER — Encounter: Payer: Self-pay | Admitting: Neurology

## 2016-03-04 VITALS — BP 114/70 | HR 72 | Resp 16 | Ht 64.0 in | Wt 202.5 lb

## 2016-03-04 DIAGNOSIS — G35 Multiple sclerosis: Secondary | ICD-10-CM

## 2016-03-04 DIAGNOSIS — R350 Frequency of micturition: Secondary | ICD-10-CM

## 2016-03-04 DIAGNOSIS — M5432 Sciatica, left side: Secondary | ICD-10-CM | POA: Diagnosis not present

## 2016-03-04 DIAGNOSIS — R200 Anesthesia of skin: Secondary | ICD-10-CM

## 2016-03-04 DIAGNOSIS — F329 Major depressive disorder, single episode, unspecified: Secondary | ICD-10-CM | POA: Diagnosis not present

## 2016-03-04 DIAGNOSIS — Z79899 Other long term (current) drug therapy: Secondary | ICD-10-CM

## 2016-03-04 DIAGNOSIS — R270 Ataxia, unspecified: Secondary | ICD-10-CM

## 2016-03-04 DIAGNOSIS — F32A Depression, unspecified: Secondary | ICD-10-CM

## 2016-03-04 MED ORDER — CYCLOBENZAPRINE HCL 5 MG PO TABS: 5.0000 mg | ORAL_TABLET | Freq: Three times a day (TID) | ORAL | 5 refills | Status: DC | PRN

## 2016-03-04 MED ORDER — DULOXETINE HCL 20 MG PO CPEP: 60.0000 mg | ORAL_CAPSULE | Freq: Every day | ORAL | Status: DC

## 2016-03-04 NOTE — Progress Notes (Signed)
[facility]  PATIENT: Alicia Barker DOB: May 28, 1986  REFERRING DOCTOR OR PCP:  PCP is Francoise Ceo SOURCE: patient, records from hospital, MRI / lab reports, MRI images on PACS  _________________________________   HISTORICAL  CHIEF COMPLAINT:  Chief Complaint  Patient presents with  . Multiple Sclerosis    Sts. she continues to tolerate Tecfidera well.  About 3 weeks ago she was out of Tecfidera for 3 days--during that time she had onset of dizziness--only with movement, more trouble with balance.  Sts. she was hospitalized for 4 days at [facility]--received 4 days of IV steroids but doesn't think they helped much. Minimal relief with Meclizine/fim  . Dizziness    HISTORY OF PRESENT ILLNESS:  Alicia Barker is a 30 year old woman who was diagnosed with MS in 2017 and started on Tecfidera.    She is tolerating it well.   She had an episode of diplopia and the left leg and right arm felt weak.   She was also dizzy.   She went to [facility] and was placed on steroids x 4 days.       Symptoms were mildly better and she was discharged.    She is not yet at baseline.   She has been on Tecfidera x 3 months,   She notes missing a couple days around the time of the suspected exacerbation.    Gait/strength/sensation: She notes gait is off due to decreased balance.   She veers both ways with walking.   Numbness in right hand is off/on.    Left leg feels more clumsy but right leg was clumsy last episode.  . She has not noted any significant weakness.  Bladder/bowel: She has urinary frequency and urgency. She has not had any further episodes of incontinence. She also notes constipation.  Vision/vertigo: She notes that her vision is doing better.  Diplopia though is still present with a spinning sensation.     There is double vision looking in the distance.   Fatigue/sleep: She notes much more, both physical and cognitive. She felt she initially improved after her first  exacerbation but that this has been worsening in the last 2 months. fatigue in the last couple months. Her insomnia improved with nighttime cyclobenzaprine earlier this year but she is out and sleep is poor again.  Mood/cognition: She notes more depression and anxiety compared to last visit since the episode and admission.  She is more irritable and has had tearfulness. She reports apathy and poor sleep.  Eating ok.    She also has noted that her cognitive processing speed appears to be slower.  Pain:  She is noting that the stabbing-like pain going from her buttock down the left leg is worse again. Pain radiates to the foot at times.  MS History:   In early March, 2017, she had the onset of slurred speech and leg weakness and ataxia.    A head CT was reportedly normal.    Over the next couple weeks, she had right worse than left visual acuity issues, more numbness and more clumsiness.        Her gait became unsteady.     She also noted mid back pain.    She went to the ER and was diagnosed with an inner ear problem.   She went back to ER 11/15/15 and was found to have an abnormal MRI consistent with MS.    MRI of the spine also showed additional plaques.  She received 5 days of IV Steroids and noticed an improvement with improved gait but continued numbness.    I have reviewed the MRIs of the brain and spine performed for 12/30/2015 and 11/17/2015. The MRI of the brain with and without contrast shows multiple T2/FLAIR hyperintense foci, many in the periventricular white matter.    Another focus is the pons (with mild enhancement, better seen on c-spine MRI).Marland Kitchen 7 foci enhanced after gadolinium administration imply more recent MS plaques.    There are at least 2 small T2 hyperintense foci within the cervical spine, one of which appears to enhance slightly.    Another enhancing focus is noted within the thoracic spine adjacent to T3-T4.                                                                                            REVIEW OF SYSTEMS: Constitutional: No fevers, chills, sweats, or change in appetite.  She reports fatigue an dpoor sleep  Eyes: mild right visual changes, double vision, eye pain Ear, nose and throat: No hearing loss, ear pain, nasal congestion, sore throat Cardiovascular: No chest pain, palpitations Respiratory: No shortness of breath at rest or with exertion.   No wheezes GastrointestinaI: No nausea, vomiting, diarrhea, abdominal pain, fecal incontinence Genitourinary: No dysuria, urinary retention.   She has some urgency and frequency..  No nocturia. Musculoskeletal: Reports neck pain, back pain Integumentary: No rash, pruritus, skin lesions Neurological: as above Psychiatric: Notes depression > anxiety.    Endocrine: No palpitations, diaphoresis, change in appetite, change in weigh or increased thirst Hematologic/Lymphatic: No anemia, purpura, petechiae. Allergic/Immunologic: No itchy/runny eyes, nasal congestion, recent allergic reactions, rashes  ALLERGIES: Allergies  Allergen Reactions  . Penicillins Anaphylaxis, Shortness Of Breath and Swelling    Has patient had a PCN reaction causing immediate rash, facial/tongue/throat swelling, SOB or lightheadedness with hypotension: YES Has patient had a PCN reaction causing severe rash involving mucus membranes or skin necrosis: NO Has patient had a PCN reaction that required hospitalization NO Has patient had a PCN reaction occurring within the last 10 years: NO If all of the above answers are "NO", then may proceed with Cephalosporin use.    HOME MEDICATIONS:  Current Outpatient Prescriptions:  .  acetaminophen-codeine (TYLENOL #3) 300-30 MG tablet, Take 1 tablet by mouth every 8 (eight) hours as needed for moderate pain., Disp: 90 tablet, Rfl: 2 .  cyclobenzaprine (FLEXERIL) 5 MG tablet, Take 1 tablet (5 mg total) by mouth at bedtime., Disp: 30 tablet, Rfl: 5 .  Dimethyl Fumarate 240 MG CPDR, Take 240 mg by mouth 2  (two) times daily., Disp: , Rfl:  .  meclizine (ANTIVERT) 25 MG tablet, Take 1 tablet (25 mg total) by mouth 3 (three) times daily as needed for dizziness., Disp: 30 tablet, Rfl: 0 .  ondansetron (ZOFRAN) 4 MG tablet, Take 1 tablet (4 mg total) by mouth every 6 (six) hours. Prn nausea or vomiting, Disp: 12 tablet, Rfl: 0 .  ranitidine (ZANTAC) 150 MG tablet, Take 1 tablet (150 mg total) by mouth 2 (two) times daily., Disp: 60 tablet, Rfl: 1 .  Vitamin D,  Ergocalciferol, (DRISDOL) 50000 units CAPS capsule, Take 1 capsule (50,000 Units total) by mouth every 7 (seven) days. (Patient taking differently: Take 50,000 Units by mouth every 7 (seven) days. On Monday), Disp: 12 capsule, Rfl: 0 .  meloxicam (MOBIC) 15 MG tablet, Take 1 tablet (15 mg total) by mouth daily. (Patient not taking: Reported on 03/04/2016), Disp: 30 tablet, Rfl: 11 .  methylPREDNISolone (MEDROL) 4 MG tablet, Take 6 pills po x 1 d, then 5 po x 1 day, then 4 po x 1d, then 3 po x 1d, then 2 po x 1d, then 1 po x 1d (Patient not taking: Reported on 02/22/2016), Disp: 21 tablet, Rfl: 0 .  omeprazole (PRILOSEC) 20 MG capsule, Take 1 capsule (20 mg total) by mouth daily. (Patient not taking: Reported on 02/22/2016), Disp: 30 capsule, Rfl: 3 .  traMADol (ULTRAM) 50 MG tablet, Take 1 tablet (50 mg total) by mouth every 8 (eight) hours as needed. (Patient not taking: Reported on 02/22/2016), Disp: 15 tablet, Rfl: 0  PAST MEDICAL HISTORY: Past Medical History:  Diagnosis Date  . Depression    no meds currently  . MS (multiple sclerosis) (HCC)   . SVD (spontaneous vaginal delivery)    x 5    PAST SURGICAL HISTORY: Past Surgical History:  Procedure Laterality Date  . LAPAROSCOPIC TUBAL LIGATION Bilateral 01/06/2013   Procedure: LAPAROSCOPIC TUBAL LIGATION;  Surgeon: Kathreen Cosier, MD;  Location: [facility];  Service: Gynecology;  Laterality: Bilateral;  . right hand surgery      FAMILY HISTORY: Family History  Problem Relation Age of  Onset  . Diabetes Mother   . Heart disease Mother   . Diabetes Father   . Multiple sclerosis Cousin     SOCIAL HISTORY:  Social History   Social History  . Marital status: Married    Spouse name: N/A  . Number of children: N/A  . Years of education: N/A   Occupational History  . Not on file.   Social History Main Topics  . Smoking status: Former Smoker    Packs/day: 0.10    Types: Cigarettes  . Smokeless tobacco: Never Used  . Alcohol use No  . Drug use: No  . Sexual activity: Yes    Birth control/ protection: None   Other Topics Concern  . Not on file   Social History Narrative  . No narrative on file     PHYSICAL EXAM  Vitals:   03/04/16 0823  BP: 114/70  Pulse: 72  Resp: 16  Weight: 202 lb 8 oz (91.9 kg)  Height: 5\' 4"  (1.626 m)    Body mass index is 34.76 kg/m.   General: The patient is well-developed and well-nourished and in no acute distress.  Mood:  She appears to have a depressed affect. There are no flight of ideas. She answered questions with short responses.  Skin:   There is no rash. There is no pedal edema.   Musculoskeletal:  Her back is tender over lumbar paraspinals and left greater than right piriformis muscle. No tenderness at the trochanteric bursae today.    Neck is tender with good ROM  Neurologic Exam  Mental status: The patient is alert and oriented x 3 at the time of the examination. The patient has apparent normal recent and remote memory, with an apparently normal attention span and concentration ability.   Speech is normal.  Cranial nerves: Extraocular movements are full. She had a 1+ right APD.  Facial symmetry is present. There  is good facial sensation to  soft touch bilaterally.Facial strength is normal.  Trapezius and sternocleidomastoid strength is normal. No dysarthria is noted.  The tongue is midline, and the patient has symmetric elevation of the soft palate. No obvious hearing deficits are noted.  Motor:  Muscle  bulk is normal.   Tone is normal. Strength is  5 / 5 in all 4 extremities.   Sensory: Sensory testing is intact to touch and vibration sensation in all 4 extremities.    Coordination: Cerebellar testing reveals good finger-nose-finger and reduced heel-to-shin bilaterally.   RAM is slow in legs  Gait and station: Station is normal.   Gait is wide with unbalanced turn. She cannot tandem gait and her Romberg sign is positive.  Reflexes: Deep tendon reflexes are symmetric and normal bilaterally.    Plantar reflexes are normal.      DIAGNOSTIC DATA (LABS, IMAGING, TESTING) - I reviewed patient records, labs, notes, testing and imaging myself where available.  Lab Results  Component Value Date   WBC 7.6 02/22/2016   HGB 12.3 02/22/2016   HCT 36.6 02/22/2016   MCV 87.8 02/22/2016   PLT 277 02/22/2016      Component Value Date/Time   NA 137 02/23/2016 0553   K 3.9 02/23/2016 0553   CL 104 02/23/2016 0553   CO2 23 02/23/2016 0553   GLUCOSE 167 (H) 02/23/2016 0553   BUN 10 02/23/2016 0553   CREATININE 0.69 02/23/2016 0553   CALCIUM 9.5 02/23/2016 0553   PROT 7.2 02/22/2016 1716   PROT 6.7 11/28/2015 1109   ALBUMIN 4.1 02/22/2016 1716   ALBUMIN 3.7 11/28/2015 1109   AST 22 02/22/2016 1716   ALT 21 02/22/2016 1716   ALKPHOS 57 02/22/2016 1716   BILITOT 0.8 02/22/2016 1716   BILITOT 0.3 11/28/2015 1109   GFRNONAA >60 02/23/2016 0553   GFRAA >60 02/23/2016 0553       ASSESSMENT AND PLAN  Multiple sclerosis (HCC) - Plan: MR Brain W Wo Contrast  Left sided sciatica  Numbness - Plan: MR Brain W Wo Contrast  Ataxia - Plan: MR Brain W Wo Contrast  Urinary frequency  High risk medication use  Depression - Plan: DULoxetine (CYMBALTA) DR capsule 60 mg    1.   She will continue Tecfidera.   We need to check an MRI of the brain to make sure that there has not been a lot of subclinical progression since earlier this year. If present, we will need to consider a shift to a  stronger MS disease modifying therapy. 2.    For her back pain,continue meloxicam andTylenol with Codeine 3.,   She is advised to exercise stay active. 4.    She was appears to have a depression and I will start duloxetine. Hopefully this will help the pain as well. Additionally, she has insomnia and I will place her on cyclobenzaprine 5 mg that she can take up to 3 times a day. She may just want to take in the evening and at bedtime to help with her insomnia.  5.   Out of work until 03/18/2016  Return in 4 months or sooner if there are new or worsening neurologic symptoms. We discussed symptoms of  possible exacerbations.   Ahamed Hofland A. Epimenio Foot, MD, PhD 03/04/2016, 8:45 AM Certified in Neurology, Clinical Neurophysiology, Sleep Medicine, Pain Medicine and Neuroimaging  [facility] [address] [place], [place] [address] [phone]

## 2016-03-04 NOTE — Telephone Encounter (Signed)
error 

## 2016-03-07 ENCOUNTER — Inpatient Hospital Stay: Payer: Medicaid Other | Admitting: Family Medicine

## 2016-03-12 ENCOUNTER — Ambulatory Visit: Payer: Medicaid Other | Admitting: Neurology

## 2016-03-13 ENCOUNTER — Other Ambulatory Visit: Payer: Medicaid Other

## 2016-03-25 ENCOUNTER — Ambulatory Visit
Admission: RE | Admit: 2016-03-25 | Discharge: 2016-03-25 | Disposition: A | Discharge status: Home / Self Care | Payer: Medicaid Other | Source: Ambulatory Visit | Attending: Neurology | Admitting: Neurology

## 2016-03-25 ENCOUNTER — Telehealth: Payer: Self-pay | Admitting: *Deleted

## 2016-03-25 DIAGNOSIS — R270 Ataxia, unspecified: Secondary | ICD-10-CM | POA: Diagnosis not present

## 2016-03-25 DIAGNOSIS — G35 Multiple sclerosis: Secondary | ICD-10-CM | POA: Diagnosis not present

## 2016-03-25 DIAGNOSIS — R200 Anesthesia of skin: Secondary | ICD-10-CM

## 2016-03-25 MED ORDER — GADOBENATE DIMEGLUMINE 529 MG/ML IV SOLN
19.0000 mL | Freq: Once | INTRAVENOUS | Status: AC | PRN
  Administered 2016-03-25: 19 mL via INTRAVENOUS

## 2016-03-25 NOTE — Telephone Encounter (Signed)
LMTC./fim 

## 2016-03-25 NOTE — Telephone Encounter (Signed)
-----   Message from Richard A Sater, MD sent at 03/25/2016  4:33 PM EDT ----- Her MRI shows that she has several brand new MS lesions ---- I would like to do 3 days IV SOlumedrol (1g) and see her this week to discuss possibly changing to a different medication for MS 

## 2016-03-26 ENCOUNTER — Ambulatory Visit: Payer: Self-pay | Admitting: Neurology

## 2016-03-26 NOTE — Telephone Encounter (Signed)
-----   Message from Asa Lente, MD sent at 03/25/2016  4:33 PM EDT ----- Her MRI shows that she has several brand new MS lesions ---- I would like to do 3 days IV SOlumedrol (1g) and see her this week to discuss possibly changing to a different medication for MS

## 2016-03-26 NOTE — Telephone Encounter (Signed)
LMTC./fim 

## 2016-03-26 NOTE — Telephone Encounter (Signed)
-----   Message from Richard A Sater, MD sent at 03/25/2016  4:33 PM EDT ----- Her MRI shows that she has several brand new MS lesions ---- I would like to do 3 days IV SOlumedrol (1g) and see her this week to discuss possibly changing to a different medication for MS 

## 2016-03-26 NOTE — Telephone Encounter (Signed)
I have spoken with Alicia Barker this morning and per RAS, advised that MRI brain shows several brand new MS lesions.  His rec. is that she have SM 1gm IV daily for 3 days and come in to discuss changing meds.  She is agreeable.  Appt. with RAS given today to discuss tx. options.  [facility].  Her first infusion is sched. for 10am tomorrow (arrival time [id number]). Address for the [facility] clinic is [address] [place], and their phone # is [phone].  I have faxed SM orders to them at fax# [phone]/fim

## 2016-03-27 ENCOUNTER — Ambulatory Visit (HOSPITAL_COMMUNITY)
Admission: RE | Admit: 2016-03-27 | Discharge: 2016-03-27 | Disposition: A | Discharge status: Home / Self Care | Payer: Medicaid Other | Source: Ambulatory Visit | Attending: Obstetrics | Admitting: Obstetrics

## 2016-03-27 DIAGNOSIS — G35 Multiple sclerosis: Secondary | ICD-10-CM | POA: Insufficient documentation

## 2016-03-27 MED ORDER — SODIUM CHLORIDE 0.9 % IV SOLN
1000.0000 mg | Freq: Every day | INTRAVENOUS | Status: DC
  Administered 2016-03-27: 1000 mg via INTRAVENOUS
  Filled 2016-03-27: qty 8

## 2016-03-27 NOTE — Discharge Instructions (Signed)
Solumedrol IV infusion today

## 2016-03-27 NOTE — Progress Notes (Signed)
Provider: Despina Ariasichard Sater MD  Associated diagnosis: Multiple Sclerosis  Procedure: Infusion of Solumedrol 1 gram today via PIV  Patient tolerated infusion without reactions or other problems. Order to leave IV in for 2 more days for this same infusion of Solumedrol. Educated patient on how to care for the site. Explained to not get the site wet and what to do if the IV comes out. DD&I at time of discharge. Went over discharge instructions and copy given to patient. Alert, oriented and ambulatory at time of discharge. Family present.

## 2016-03-28 ENCOUNTER — Ambulatory Visit (INDEPENDENT_AMBULATORY_CARE_PROVIDER_SITE_OTHER): Payer: Medicaid Other | Admitting: Neurology

## 2016-03-28 ENCOUNTER — Encounter: Payer: Self-pay | Admitting: Neurology

## 2016-03-28 ENCOUNTER — Encounter: Payer: Self-pay | Admitting: *Deleted

## 2016-03-28 ENCOUNTER — Ambulatory Visit (HOSPITAL_COMMUNITY)
Admission: RE | Admit: 2016-03-28 | Discharge: 2016-03-28 | Disposition: A | Discharge status: Home / Self Care | Payer: Medicaid Other | Source: Ambulatory Visit | Attending: Obstetrics | Admitting: Obstetrics

## 2016-03-28 VITALS — BP 112/68 | HR 66 | Resp 16 | Ht 64.0 in | Wt 209.5 lb

## 2016-03-28 DIAGNOSIS — R5383 Other fatigue: Secondary | ICD-10-CM | POA: Diagnosis not present

## 2016-03-28 DIAGNOSIS — R200 Anesthesia of skin: Secondary | ICD-10-CM

## 2016-03-28 DIAGNOSIS — F329 Major depressive disorder, single episode, unspecified: Secondary | ICD-10-CM | POA: Diagnosis not present

## 2016-03-28 DIAGNOSIS — R270 Ataxia, unspecified: Secondary | ICD-10-CM

## 2016-03-28 DIAGNOSIS — G35 Multiple sclerosis: Secondary | ICD-10-CM | POA: Diagnosis not present

## 2016-03-28 DIAGNOSIS — F32A Depression, unspecified: Secondary | ICD-10-CM

## 2016-03-28 MED ORDER — SODIUM CHLORIDE 0.9 % IV SOLN
1000.0000 mg | Freq: Every day | INTRAVENOUS | Status: DC
  Administered 2016-03-28: 1000 mg via INTRAVENOUS
  Filled 2016-03-28: qty 8

## 2016-03-28 MED ORDER — ACETAMINOPHEN-CODEINE #3 300-30 MG PO TABS: 1.0000 | ORAL_TABLET | Freq: Three times a day (TID) | ORAL | 3 refills | Status: DC | PRN

## 2016-03-28 MED ORDER — ESCITALOPRAM OXALATE 20 MG PO TABS: 20.0000 mg | ORAL_TABLET | Freq: Every day | ORAL | 11 refills | Status: DC

## 2016-03-28 NOTE — Discharge Instructions (Signed)
Today, you received an infusion of IV solumedrol.  Please contact your primary physician for follow-up instructions.

## 2016-03-28 NOTE — Progress Notes (Signed)
Provider: Despina Arias MD  Associated diagnosis: Multiple Sclerosis  Procedure: Infusion of Solumedrol 1 gram today via PIV  Patient tolerated infusion; no complications noted. Order to leave IV in for 1 more day for this same infusion of Solumedrol. Discharge instructions explained, given, and signed. Pt is alert, oriented and ambulatory at time of discharge. Family at bedside and accompanying patient.

## 2016-03-28 NOTE — Progress Notes (Signed)
Pt arrived for infusion with peripheral IV intact; will continue to monitor

## 2016-03-28 NOTE — Progress Notes (Signed)
[facility]  PATIENT: RYLANN MUNFORD DOB: [birth year]  REFERRING DOCTOR OR PCP:  PCP is Francoise Ceo SOURCE: patient, records from hospital, MRI / lab reports, MRI images on PACS  _________________________________   HISTORICAL  CHIEF COMPLAINT:  Chief Complaint  Patient presents with  . Multiple Sclerosis    Here to discuss MRI results.  Has completed 2 of 3 days of SoluMedrol 1gram IV.  Sts. occasionally misses a dose of Tecfidera/fim    HISTORY OF PRESENT ILLNESS:  Tyanne Derocher is a 30 year old woman who was diagnosed with MS in March 2017 and started on Tecfidera April 2017.    She is tolerating it well.  Last month,  she had more difficulty with balance and speech was slurred.   We obtained an MRI 03/25/2016 showing several new enhancing lesions c/w active demyelination   She received her second day of IV steroids (3)  Gait/strength/sensation: She notes gait is off due to decreased balance.   She veers both ways with walking.   Numbness in right hand is off/on.    Left leg feels more clumsy but right leg was clumsy last episode.  . She has not noted any significant weakness.  Bladder/bowel: She has urinary frequency and urgency. She has not had any further episodes of incontinence. She also notes constipation.  Vision/vertigo: She notes that her vision is doing better.  Diplopia though is still present with a spinning sensation.     There is double vision looking in the distance.   Fatigue/sleep: She notes much more, both physical and cognitive. She felt she initially improved after her first exacerbation but that this has been worsening in the last 2 months. fatigue in the last couple months. Her insomnia improved with nighttime cyclobenzaprine earlier this year but she is out and sleep is poor again.  Mood/cognition: She notes more depression and anxiety compared to last visit since the episode and admission.  She is more irritable and has had  tearfulness. She reports apathy and poor sleep.  Eating ok.    She also has noted that her cognitive processing speed appears to be slower.  Pain:  She is noting that the stabbing-like pain going from her buttock down the left leg is worse again. Pain radiates to the foot at times.  MS History:   In early March, 2017, she had the onset of slurred speech and leg weakness and ataxia.    A head CT was reportedly normal.    Over the next couple weeks, she had right worse than left visual acuity issues, more numbness and more clumsiness.        Her gait became unsteady.     She also noted mid back pain.    She went to the ER and was diagnosed with an inner ear problem.   She went back to ER 11/15/15 and was found to have an abnormal MRI consistent with MS.    MRI of the spine also showed additional plaques.     She received 5 days of IV Steroids and noticed an improvement with improved gait but continued numbness.    I have reviewed the MRIs of the brain and spine performed for 12/30/2015 and 11/17/2015. The MRI of the brain with and without contrast shows multiple T2/FLAIR hyperintense foci, many in the periventricular white matter.    Another focus is the pons (with mild enhancement, better seen on c-spine MRI).Marland Kitchen 7 foci enhanced after gadolinium administration imply more recent MS plaques.  There are at least 2 small T2 hyperintense foci within the cervical spine, one of which appears to enhance slightly.    Another enhancing focus is noted within the thoracic spine adjacent to T3-T4.   She started Tecfidera after I first saw her 11/28/2015.                                                                                        REVIEW OF SYSTEMS: Constitutional: No fevers, chills, sweats, or change in appetite.  She reports fatigue an dpoor sleep  Eyes: mild right visual changes, double vision, eye pain Ear, nose and throat: No hearing loss, ear pain, nasal congestion, sore throat Cardiovascular: No chest  pain, palpitations Respiratory: No shortness of breath at rest or with exertion.   No wheezes GastrointestinaI: No nausea, vomiting, diarrhea, abdominal pain, fecal incontinence Genitourinary: No dysuria, urinary retention.   She has some urgency and frequency..  No nocturia. Musculoskeletal: Reports neck pain, back pain Integumentary: No rash, pruritus, skin lesions Neurological: as above Psychiatric: Notes depression > anxiety.    Endocrine: No palpitations, diaphoresis, change in appetite, change in weigh or increased thirst Hematologic/Lymphatic: No anemia, purpura, petechiae. Allergic/Immunologic: No itchy/runny eyes, nasal congestion, recent allergic reactions, rashes  ALLERGIES: Allergies  Allergen Reactions  . Penicillins Anaphylaxis, Shortness Of Breath and Swelling    Has patient had a PCN reaction causing immediate rash, facial/tongue/throat swelling, SOB or lightheadedness with hypotension: YES Has patient had a PCN reaction causing severe rash involving mucus membranes or skin necrosis: NO Has patient had a PCN reaction that required hospitalization NO Has patient had a PCN reaction occurring within the last 10 years: NO If all of the above answers are "NO", then may proceed with Cephalosporin use.    HOME MEDICATIONS:  Current Outpatient Prescriptions:  .  acetaminophen-codeine (TYLENOL #3) 300-30 MG tablet, Take 1 tablet by mouth every 8 (eight) hours as needed for moderate pain., Disp: 90 tablet, Rfl: 3 .  cyclobenzaprine (FLEXERIL) 5 MG tablet, Take 1 tablet (5 mg total) by mouth 3 (three) times daily as needed for muscle spasms., Disp: 90 tablet, Rfl: 5 .  Dimethyl Fumarate 240 MG CPDR, Take 240 mg by mouth 2 (two) times daily., Disp: , Rfl:  .  meclizine (ANTIVERT) 25 MG tablet, Take 1 tablet (25 mg total) by mouth 3 (three) times daily as needed for dizziness., Disp: 30 tablet, Rfl: 0 .  ondansetron (ZOFRAN) 4 MG tablet, Take 1 tablet (4 mg total) by mouth every  6 (six) hours. Prn nausea or vomiting, Disp: 12 tablet, Rfl: 0 .  ranitidine (ZANTAC) 150 MG tablet, Take 1 tablet (150 mg total) by mouth 2 (two) times daily., Disp: 60 tablet, Rfl: 1 .  traMADol (ULTRAM) 50 MG tablet, Take 1 tablet (50 mg total) by mouth every 8 (eight) hours as needed., Disp: 15 tablet, Rfl: 0 .  Vitamin D, Ergocalciferol, (DRISDOL) 50000 units CAPS capsule, Take 1 capsule (50,000 Units total) by mouth every 7 (seven) days. (Patient taking differently: Take 50,000 Units by mouth every 7 (seven) days. On Monday), Disp: 12 capsule, Rfl: 0 .  escitalopram (LEXAPRO) 20 MG tablet,  Take 1 tablet (20 mg total) by mouth daily., Disp: 30 tablet, Rfl: 11 .  meloxicam (MOBIC) 15 MG tablet, Take 1 tablet (15 mg total) by mouth daily. (Patient not taking: Reported on 03/04/2016), Disp: 30 tablet, Rfl: 11 .  methylPREDNISolone (MEDROL) 4 MG tablet, Take 6 pills po x 1 d, then 5 po x 1 day, then 4 po x 1d, then 3 po x 1d, then 2 po x 1d, then 1 po x 1d (Patient not taking: Reported on 02/22/2016), Disp: 21 tablet, Rfl: 0 .  natalizumab (TYSABRI) 300 MG/15ML injection, Inject 300 mg into the vein every 30 (thirty) days., Disp: , Rfl:  .  omeprazole (PRILOSEC) 20 MG capsule, Take 1 capsule (20 mg total) by mouth daily. (Patient not taking: Reported on 02/22/2016), Disp: 30 capsule, Rfl: 3  Current Facility-Administered Medications:  .  DULoxetine (CYMBALTA) DR capsule 60 mg, 60 mg, Oral, Daily, Asa Lente, MD  Facility-Administered Medications Ordered in Other Visits:  .  methylPREDNISolone sodium succinate (SOLU-MEDROL) 1,000 mg in sodium chloride 0.9 % 50 mL IVPB, 1,000 mg, Intravenous, Daily, Asa Lente, MD, 1,000 mg at 03/28/16 1139  PAST MEDICAL HISTORY: Past Medical History:  Diagnosis Date  . Depression    no meds currently  . MS (multiple sclerosis) (HCC)   . SVD (spontaneous vaginal delivery)    x 5    PAST SURGICAL HISTORY: Past Surgical History:  Procedure Laterality  Date  . LAPAROSCOPIC TUBAL LIGATION Bilateral 01/06/2013   Procedure: LAPAROSCOPIC TUBAL LIGATION;  Surgeon: Kathreen Cosier, MD;  Location: [facility];  Service: Gynecology;  Laterality: Bilateral;  . right hand surgery      FAMILY HISTORY: Family History  Problem Relation Age of Onset  . Diabetes Mother   . Heart disease Mother   . Diabetes Father   . Multiple sclerosis Cousin     SOCIAL HISTORY:  Social History   Social History  . Marital status: Married    Spouse name: N/A  . Number of children: N/A  . Years of education: N/A   Occupational History  . Not on file.   Social History Main Topics  . Smoking status: Former Smoker    Packs/day: 0.10    Types: Cigarettes  . Smokeless tobacco: Never Used  . Alcohol use No  . Drug use: No  . Sexual activity: Yes    Birth control/ protection: None   Other Topics Concern  . Not on file   Social History Narrative  . No narrative on file     PHYSICAL EXAM  Vitals:   03/28/16 1519  BP: 112/68  Pulse: 66  Resp: 16  Weight: 209 lb 8 oz (95 kg)  Height: 5\' 4"  (1.626 m)    Body mass index is 35.96 kg/m.   General: The patient is well-developed and well-nourished and in no acute distress.  Mood:  She appears to have a depressed affect, though better than last visit.      Skin:   There is no rash. There is no pedal edema.   Musculoskeletal:  Her back is tender over lumbar paraspinals and left greater than right piriformis muscle. No tenderness at the trochanteric bursae today.    Neck is tender with good ROM  Neurologic Exam  Mental status: The patient is alert and oriented x 3 at the time of the examination. The patient has apparent normal recent and remote memory, with an apparently normal attention span and concentration ability.   Speech  is normal.  Cranial nerves: Extraocular movements are full. She had a 1+ right APD.  Facial symmetry is present. There is good facial sensation to  soft touch  bilaterally.Facial strength is normal.  Trapezius and sternocleidomastoid strength is normal. No dysarthria is noted.  The tongue is midline, and the patient has symmetric elevation of the soft palate. No obvious hearing deficits are noted.  Motor:  Muscle bulk is normal.   Tone is normal. Strength is  5 / 5 in all 4 extremities.   Sensory: Sensory testing shows reduced left arm and leg sensation on the left to temperature and vibration.     Coordination: Cerebellar testing reveals good finger-nose-finger and reduced heel-to-shin bilaterally.   RAM is slow in legs  Gait and station: Station is normal.   Gait is wide with unbalanced turn. She cannot tandem gait and her Romberg sign is positive.  Reflexes: Deep tendon reflexes are symmetric and normal bilaterally.    Plantar reflexes are normal.      DIAGNOSTIC DATA (LABS, IMAGING, TESTING) - I reviewed patient records, labs, notes, testing and imaging myself where available.  Lab Results  Component Value Date   WBC 7.6 02/22/2016   HGB 12.3 02/22/2016   HCT 36.6 02/22/2016   MCV 87.8 02/22/2016   PLT 277 02/22/2016      Component Value Date/Time   NA 137 02/23/2016 0553   K 3.9 02/23/2016 0553   CL 104 02/23/2016 0553   CO2 23 02/23/2016 0553   GLUCOSE 167 (H) 02/23/2016 0553   BUN 10 02/23/2016 0553   CREATININE 0.69 02/23/2016 0553   CALCIUM 9.5 02/23/2016 0553   PROT 7.2 02/22/2016 1716   PROT 6.7 11/28/2015 1109   ALBUMIN 4.1 02/22/2016 1716   ALBUMIN 3.7 11/28/2015 1109   AST 22 02/22/2016 1716   ALT 21 02/22/2016 1716   ALKPHOS 57 02/22/2016 1716   BILITOT 0.8 02/22/2016 1716   BILITOT 0.3 11/28/2015 1109   GFRNONAA >60 02/23/2016 0553   GFRAA >60 02/23/2016 0553       ASSESSMENT AND PLAN  Multiple sclerosis (HCC) - Plan: Stratify JCV Antibody Test ([facility]), CBC with Differential/Platelets  Numbness  Ataxia  Other fatigue  Depression    1.   She has several new enhancing lesions despite being on  Tecfidera. She has fair compliance. I discussed with her that I think we need to move her to more efficacious medication. We went over her options including Tysabri 300 mg IV every 4 weeks. I had her sign a service request form and we will were started within a couple of weeks .  She will do 1 more day of IV steroids. 2.    For her back pain,continue meloxicam andTylenol with Codeine and she is advised to exercise stay active. 3.    Continue duloxetine 60 mg daily and Flexeril 5 mg nightly. 4.   I wrote her a short note explaining that she has MS and has had several exacerbations associated with impairments.   Return in 3 months or sooner if there are new or worsening neurologic symptoms. We discussed symptoms of  possible exacerbations.  40 minutes face-to-face evaluation with greater than one half of the time counseling and coordinating care about her breakthrough MS disease and need to change to a more efficacious disease modifying therapy and discussing the risks and benefits of a different therapy.   Richard A. Epimenio FootSater, MD, PhD 03/28/2016, 5:48 PM Certified in Neurology, Clinical Neurophysiology, Sleep Medicine, Pain Medicine  and Neuroimaging  [facility] [address] [place], [place] [address] [phone]

## 2016-03-29 ENCOUNTER — Ambulatory Visit (HOSPITAL_COMMUNITY)
Admission: RE | Admit: 2016-03-29 | Discharge: 2016-03-29 | Disposition: A | Discharge status: Home / Self Care | Payer: Medicaid Other | Source: Ambulatory Visit | Attending: Obstetrics | Admitting: Obstetrics

## 2016-03-29 DIAGNOSIS — G35 Multiple sclerosis: Secondary | ICD-10-CM | POA: Diagnosis not present

## 2016-03-29 LAB — CBC WITH DIFFERENTIAL/PLATELET
BASOS ABS: 0 10*3/uL (ref 0.0–0.2)
Basos: 0 %
EOS (ABSOLUTE): 0 10*3/uL (ref 0.0–0.4)
EOS: 0 %
Hematocrit: 35.6 % (ref 34.0–46.6)
Hemoglobin: 11.8 g/dL (ref 11.1–15.9)
IMMATURE GRANS (ABS): 0.1 10*3/uL (ref 0.0–0.1)
IMMATURE GRANULOCYTES: 1 %
LYMPHS ABS: 0.9 10*3/uL (ref 0.7–3.1)
LYMPHS: 5 %
MCH: 29.6 pg (ref 26.6–33.0)
MCHC: 33.1 g/dL (ref 31.5–35.7)
MCV: 89 fL (ref 79–97)
MONOCYTES: 2 %
Monocytes Absolute: 0.4 10*3/uL (ref 0.1–0.9)
NEUTROS ABS: 15.7 10*3/uL — AB (ref 1.4–7.0)
Neutrophils: 92 %
PLATELETS: 331 10*3/uL (ref 150–379)
RBC: 3.99 x10E6/uL (ref 3.77–5.28)
RDW: 13.7 % (ref 12.3–15.4)
WBC: 17.2 10*3/uL — ABNORMAL HIGH (ref 3.4–10.8)

## 2016-03-29 MED ORDER — SODIUM CHLORIDE 0.9 % IV SOLN
1000.0000 mg | Freq: Once | INTRAVENOUS | Status: AC
  Administered 2016-03-29: 1000 mg via INTRAVENOUS
  Filled 2016-03-29: qty 8

## 2016-03-29 NOTE — Progress Notes (Signed)
Provider: Despina Arias MD  Associated diagnosis: Multiple Sclerosis  Procedure: Infusion of Solumedrol 1 gram today via PIV  Patient tolerated infusion without reactions or other problems. This was the third infusion so the IV was discontinued. Site in left forearm clear. Instructed patient to seek medical attention if the site were to become swollen, red and inflamed. Went over discharge instructions and copy given to patient. Alert, oriented and ambulatory at time of discharge. Family present.

## 2016-03-29 NOTE — Discharge Instructions (Signed)
Solumedrol 1 gram IV infusion

## 2016-04-02 ENCOUNTER — Ambulatory Visit: Payer: Medicaid Other | Admitting: Neurology

## 2016-04-05 ENCOUNTER — Encounter: Payer: Self-pay | Admitting: Neurology

## 2016-04-08 ENCOUNTER — Encounter: Payer: Self-pay | Admitting: Family Medicine

## 2016-04-08 ENCOUNTER — Ambulatory Visit (INDEPENDENT_AMBULATORY_CARE_PROVIDER_SITE_OTHER): Payer: Medicaid Other | Admitting: Family Medicine

## 2016-04-08 ENCOUNTER — Telehealth: Payer: Self-pay | Admitting: Neurology

## 2016-04-08 VITALS — BP 116/76 | HR 96 | Temp 98.4°F | Ht 64.0 in | Wt 215.2 lb

## 2016-04-08 DIAGNOSIS — R300 Dysuria: Secondary | ICD-10-CM

## 2016-04-08 DIAGNOSIS — Z5900 Homelessness unspecified: Secondary | ICD-10-CM

## 2016-04-08 DIAGNOSIS — G35 Multiple sclerosis: Secondary | ICD-10-CM | POA: Diagnosis not present

## 2016-04-08 DIAGNOSIS — Z0000 Encounter for general adult medical examination without abnormal findings: Secondary | ICD-10-CM

## 2016-04-08 DIAGNOSIS — Z79899 Other long term (current) drug therapy: Secondary | ICD-10-CM

## 2016-04-08 LAB — POCT URINALYSIS DIPSTICK
BILIRUBIN UA: NEGATIVE
Blood, UA: NEGATIVE
GLUCOSE UA: NEGATIVE
Ketones, UA: NEGATIVE
Leukocytes, UA: NEGATIVE
Nitrite, UA: NEGATIVE
PH UA: 5.5
Protein, UA: NEGATIVE
Spec Grav, UA: 1.025
Urobilinogen, UA: 1

## 2016-04-08 MED ORDER — MICONAZOLE NITRATE 2 % VA CREA: 1.0000 | TOPICAL_CREAM | Freq: Every day | VAGINAL | Status: DC

## 2016-04-08 MED ORDER — MICONAZOLE NITRATE 100 MG VA SUPP: 100.0000 mg | Freq: Every day | VAGINAL | 0 refills | Status: DC

## 2016-04-08 NOTE — Patient Instructions (Addendum)
Today we discussed your recent hospitalization and Neurology visit with Dr. Epimenio Foot and the new findings on your MRI.   I think it is a great idea that you're on board with trying the new medication that he recommended.    We also discussed the feeling of needing to urinate, vaginal itching and white discharge.  This is likely a yeast infection.  I have prescribed you miconazole vaginal suppository that you will take 2 times daily for 7 days.    Lastly we discussed your housing situation and financial troubles.  I offered to write a letter on your behalf to the [place] housing authority to help with section 8 housing.  We also talked about calling DSS to start the process for a disability application.  Thank you for coming in to see me today.  Alphons Burgert L. Myrtie Soman, MD [facility] Resident PGY-1 04/08/2016 3:00 PM

## 2016-04-08 NOTE — Progress Notes (Signed)
Subjective:  Alicia Barker is a 30 y.o. female who [facility] today for a hospital follow up.   HPI: Alicia Barker is a 30yo F with history significant for MS with a recent flair up that required admission to [facility] on 02/22/16.  She received solumedrol and was discharged with recommendation to follow up with her Neurologist.  She was [facility] shortly after discharge and had repeat MRI head that showed new lesions.  Around this time she also began having worsening symptoms and began another course of solumedrol outpatient. Her Neurologist has since recommended that she begin natiluzimab, which she will start in a couple of weeks. She does feel improved since receiving the last course of steroids.    She also endorses some burning with urination, itching and white discharge.  She had a POCT UA that was negative. No significant history of UTIs.  No blood in urine.  Has previously had yeast infections and this feels similar.   Patient also still homeless and struggling to make ends meet.  She is currently residing in a hotel and is on a list for section 8 housing for the past 8 mo.  She was given a timeframe of 8-10 mo.  Asked if I would write a letter to [facility] housing authority, which I agreed.   Objective:  Physical Exam: BP 116/76   Pulse 96   Temp 98.4 F (36.9 C) (Oral)   Ht 5\' 4"  (1.626 m)   Wt 215 lb 3.2 oz (97.6 kg)   LMP 02/10/2016   BMI 36.94 kg/m   Gen: well appearing 29yo woman in NAD, resting comfortably CV: RRR with no murmurs appreciated Pulm: NWOB, CTAB with no crackles, wheezes, or rhonchi GI: Normal bowel sounds present. Soft, Nontender, Nondistended. MSK: no edema, cyanosis, or clubbing noted Skin: warm, dry Neuro: grossly normal, moves all extremities Psych: Normal affect and thought content  Results for orders placed or performed in visit on 04/08/16 (from the past 72 hour(s))  Urinalysis Dipstick     Status: None   Collection Time: 04/08/16  1:58 PM  Result Value Ref Range   Color, UA yellow    Clarity, UA cloudy    Glucose, UA neg    Bilirubin, UA neg    Ketones, UA neg    Spec Grav, UA 1.025    Blood, UA neg    pH, UA 5.5    Protein, UA neg    Urobilinogen, UA 1.0    Nitrite, UA neg    Leukocytes, UA Negative Negative     Assessment/Plan:  Dysuria Patient describes dysuria for past week with vaginal itching and white discharge.  She requested a UA, which was negative.  I did not do a speculum exam, but suspect that this is likely a yeast infection.   - 7 days miconazole 100mg  vaginal suppository at bedtime.    Multiple sclerosis [facility](HCC) Patient [facility]. Recent MS flair and new lesions demonstrated on brain MRI, patient will be started on natalizumab.  - appreciate Neurology care; will follow recommendations.   Homeless family Alicia Barker and children are currently homeless and living in a hotel.  She and her husband have broken up for time being.  She is unemployed due to her MS.  She has been on a list for section 8 for the past 8 months and was given a time period of 8-10 mo.   - recommended contacting DSS  to open disability application - will write letter to [facility] housing authority   Healthcare maintenance Patient due for flu-shot and prevnar, given her immunocompromised state. - administered prevnar and flu shot.

## 2016-04-08 NOTE — Telephone Encounter (Signed)
I left a message to go over the results of the JCV antibody test. He has high titer positive which would imply higher risk of PML if she goes on Tysabri for an extended period of time.  She is breaking through the Tecfidera and needs to switch medications.  I was going to discuss options which are: 1.   We could go on Tysabri but I would want to switch her off for 12-18 months. 2.   We could also do ocrelizumab which has a lower risk of PML.

## 2016-04-09 ENCOUNTER — Other Ambulatory Visit (HOSPITAL_COMMUNITY): Payer: Self-pay | Admitting: Neurology

## 2016-04-09 NOTE — Telephone Encounter (Signed)
I spoke with Alicia Barker about her JCV antibody test. As she is high titer positive my recommendation would be that she go on ocrelizumab instead of Tysabri.  She is in agreement and will come in this week to get her hepatitis and TB test done.  Faith:   Please have her sign the ocrelizumab form when she comes in for her blood work.   I have placed the blood work orders

## 2016-04-10 DIAGNOSIS — R300 Dysuria: Secondary | ICD-10-CM | POA: Insufficient documentation

## 2016-04-10 DIAGNOSIS — Z5900 Homelessness unspecified: Secondary | ICD-10-CM | POA: Insufficient documentation

## 2016-04-10 DIAGNOSIS — Z0000 Encounter for general adult medical examination without abnormal findings: Secondary | ICD-10-CM | POA: Insufficient documentation

## 2016-04-10 NOTE — Assessment & Plan Note (Signed)
Patient is being followed by [facility]. Recent MS flair and new lesions demonstrated on brain MRI, patient will be started on natalizumab.  - appreciate Neurology care; will follow recommendations.

## 2016-04-10 NOTE — Assessment & Plan Note (Signed)
Patient describes dysuria for past week with vaginal itching and white discharge.  She requested a UA, which was negative.  I did not do a speculum exam, but suspect that this is likely a yeast infection.   - 7 days miconazole 100mg  vaginal suppository at bedtime.

## 2016-04-10 NOTE — Assessment & Plan Note (Signed)
Alicia Barker and children are currently homeless and living in a hotel.  She and her husband have broken up for time being.  She is unemployed due to her MS.  She has been on a list for section 8 for the past 8 months and was given a time period of 8-10 mo.   - recommended contacting DSS to open disability application - will [place] letter to [place] housing authority

## 2016-04-10 NOTE — Assessment & Plan Note (Addendum)
Patient due for flu-shot and prevnar, given her immunocompromised state. - administered prevnar and flu shot.

## 2016-04-11 ENCOUNTER — Other Ambulatory Visit: Payer: Self-pay | Admitting: Neurology

## 2016-04-11 DIAGNOSIS — E559 Vitamin D deficiency, unspecified: Secondary | ICD-10-CM

## 2016-04-11 NOTE — Telephone Encounter (Signed)
Called and LVM for pt to call. Per phone note from 11/29/15, "vit. d 50,000iu weekly for 12 weeks, then otc vit. d 5,000iu daily"   Pt should now be taking OTC vitamin D 5,000 units daily.

## 2016-04-12 NOTE — Telephone Encounter (Signed)
I have spoken with Alicia Barker this morning.  She sts. she will come in this morning for labwork and to sign Ocrelizumab srf/fim

## 2016-04-16 ENCOUNTER — Other Ambulatory Visit (INDEPENDENT_AMBULATORY_CARE_PROVIDER_SITE_OTHER): Payer: Self-pay

## 2016-04-16 DIAGNOSIS — Z0289 Encounter for other administrative examinations: Secondary | ICD-10-CM

## 2016-04-16 DIAGNOSIS — G35 Multiple sclerosis: Secondary | ICD-10-CM

## 2016-04-16 DIAGNOSIS — Z79899 Other long term (current) drug therapy: Secondary | ICD-10-CM

## 2016-04-16 NOTE — Telephone Encounter (Signed)
Eloisa came into the office today and signed Ocrelizumab srf and had labs drawn.  I have given srf to [facility] in the infusion suite/fim

## 2016-04-17 LAB — HEPATIC FUNCTION PANEL
ALBUMIN: 3.9 g/dL (ref 3.5–5.5)
ALK PHOS: 59 IU/L (ref 39–117)
ALT: 16 IU/L (ref 0–32)
AST: 18 IU/L (ref 0–40)
Bilirubin, Direct: 0.06 mg/dL (ref 0.00–0.40)
Total Protein: 6.5 g/dL (ref 6.0–8.5)

## 2016-04-17 LAB — HEPATITIS B SURFACE ANTIGEN: HEP B S AG: NEGATIVE

## 2016-04-17 LAB — HEPATITIS B SURFACE ANTIBODY,QUALITATIVE: Hep B Surface Ab, Qual: NONREACTIVE

## 2016-04-17 LAB — HEPATITIS B CORE ANTIBODY, TOTAL: Hep B Core Total Ab: NEGATIVE

## 2016-04-19 ENCOUNTER — Encounter (HOSPITAL_COMMUNITY)
Admission: RE | Admit: 2016-04-19 | Discharge: 2016-04-19 | Disposition: A | Discharge status: Home / Self Care | Payer: Medicaid Other | Source: Ambulatory Visit | Attending: Neurology | Admitting: Neurology

## 2016-04-19 ENCOUNTER — Other Ambulatory Visit (HOSPITAL_COMMUNITY): Payer: Self-pay | Admitting: Neurology

## 2016-04-19 LAB — QUANTIFERON IN TUBE
QFT TB AG MINUS NIL VALUE: <0 IU/mL
QUANTIFERON MITOGEN VALUE: 7.64 IU/mL
QUANTIFERON NIL VALUE: 0.05 [IU]/mL
QUANTIFERON TB AG VALUE: 0.04 [IU]/mL
QUANTIFERON TB GOLD: NEGATIVE

## 2016-04-19 LAB — QUANTIFERON TB GOLD ASSAY (BLOOD)

## 2016-04-22 ENCOUNTER — Telehealth: Payer: Self-pay | Admitting: Neurology

## 2016-04-22 ENCOUNTER — Telehealth: Payer: Self-pay | Admitting: *Deleted

## 2016-04-22 DIAGNOSIS — M545 Low back pain, unspecified: Secondary | ICD-10-CM

## 2016-04-22 NOTE — Telephone Encounter (Signed)
Noted/fim 

## 2016-04-22 NOTE — Telephone Encounter (Signed)
-----   Message from Asa Lente, MD sent at 04/19/2016  8:43 PM EDT ----- The TB gold test was also negative.   I know you already sent in the ocrelizumab form so she is cleared to be scheduled once preauthorize.

## 2016-04-22 NOTE — Telephone Encounter (Signed)
Pt called in stating  acetaminophen-codeine (TYLENOL #3) 300-30 MG tablet is making her sick on her stomach and it is not helping. Please call and advise [phone] She also wants to know if Medicaid will allow her to do 6 month treatment of Ocreavus .

## 2016-04-22 NOTE — Telephone Encounter (Signed)
I have spoken with Alicia Barker.  She sts. back pain is worse, making it difficult to get out of bed.  Sts. balance is also worse.  She stopped Tecfidera in mid-August; awaiting approval for Ocrelizumab. Per RAS, ok for Solumedrol 1gram IV daily for one day.  [name] is agreeable, will come in at 1130 tomorrow.  Orders given to Mindy in the infusion suite/fim

## 2016-04-25 NOTE — Telephone Encounter (Signed)
Pt called in stating her back is till hurting and her legs feel very heavy, making her trip. Please call and advise

## 2016-04-25 NOTE — Telephone Encounter (Signed)
I have spoken with Alicia Barker this afternoon.  she sts. nonradiating  lbp is worse, right leg feels heavy.  No relief with tpi's given in our office.  No relief with Tyl. #3, No relief with SM 1gm IV.  Per RAS, ok to given Tramadol 50mg .  She sts. she has taken this as well without relief.  Hopefully Ocrelizumab infusions will be approved soon.  Will check with RAS tomorrow to see if there are other options for pain control./fim

## 2016-04-26 NOTE — Telephone Encounter (Signed)
LMTC.  RAS would like to further investigate possible causes of her back pain, since meds (pain meds, IV SM., and tpi's have not helped.  He would like her to have an MRI lumbar spine.  Will order if this is ok with her/fim

## 2016-04-29 NOTE — Addendum Note (Signed)
Addended by: Candis Schatz I on: 04/29/2016 02:19 PM   Modules accepted: Orders

## 2016-04-29 NOTE — Telephone Encounter (Signed)
I spoke with Alicia Barker on Friday 04-26-16 and offered mri.  She was agreeabel to same.  MRI L-spine without contrast ordered/fim

## 2016-05-06 ENCOUNTER — Other Ambulatory Visit: Payer: Self-pay | Admitting: Neurology

## 2016-05-06 DIAGNOSIS — E559 Vitamin D deficiency, unspecified: Secondary | ICD-10-CM

## 2016-05-15 ENCOUNTER — Other Ambulatory Visit: Payer: Medicaid Other

## 2016-05-21 ENCOUNTER — Ambulatory Visit: Payer: Medicaid Other | Admitting: Neurology

## 2016-05-22 ENCOUNTER — Telehealth: Payer: Self-pay | Admitting: Neurology

## 2016-05-22 NOTE — Telephone Encounter (Signed)
LMTC.  Per Inetta Fermo, she has spoken with [facility] Medicaid sent a denial letter for Ocrevus to [place]--Tina needs for [place] to either bring that form to our office for Inetta Fermo to complete, or she can complete it and sent it back to Medicaid/fim

## 2016-05-22 NOTE — Telephone Encounter (Signed)
Pt called to see if she has been approved for new medication. Please call and advise

## 2016-05-30 ENCOUNTER — Telehealth: Payer: Self-pay | Admitting: Neurology

## 2016-05-30 NOTE — Telephone Encounter (Signed)
Patient returned Faith's call °

## 2016-05-30 NOTE — Telephone Encounter (Signed)
I have spoken with Alicia Barker this morning and confirmed pt. is no longer on Tecfidera/fim

## 2016-05-30 NOTE — Telephone Encounter (Signed)
Jennifer/[facility] [phone] called regarding Tecfidera therapy change.

## 2016-06-02 ENCOUNTER — Ambulatory Visit
Admission: RE | Admit: 2016-06-02 | Discharge: 2016-06-02 | Disposition: A | Discharge status: Home / Self Care | Payer: Medicaid Other | Source: Ambulatory Visit | Attending: Neurology | Admitting: Neurology

## 2016-06-02 DIAGNOSIS — M545 Low back pain, unspecified: Secondary | ICD-10-CM

## 2016-06-04 NOTE — Telephone Encounter (Signed)
Message printed and given to Tina in the infusion suite/fim 

## 2016-06-04 NOTE — Telephone Encounter (Signed)
Patient called to advise Alicia Barker, she still hasn't received paperwork for OCREVUS, please call [phone].

## 2016-06-06 ENCOUNTER — Telehealth: Payer: Self-pay | Admitting: *Deleted

## 2016-06-06 NOTE — Telephone Encounter (Signed)
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 Can discuss further at her next appt.  She does not need to return this call unless she has further questions/fim

## 2016-06-06 NOTE — Telephone Encounter (Signed)
-----   Message from Asa Lente, MD sent at 06/03/2016 11:32 AM EDT ----- Please note that the MRI of the lumbar spine showed some arthritis at the bottom 2 lumbar levels but there was no nerve root compression.

## 2016-06-10 ENCOUNTER — Telehealth: Payer: Self-pay | Admitting: Neurology

## 2016-06-10 NOTE — Telephone Encounter (Signed)
I have spoken with Alicia Barker this afternoon  She sts. she has spoken with Medicaid and has been told there is nothing for her to do to get Ocrelizumab approved.  Will give this message to Tina/fim

## 2016-06-10 NOTE — Telephone Encounter (Signed)
Pt called in about receiving a letter about trying a different medication other than Ocreavus. . Please call and advise

## 2016-06-20 DIAGNOSIS — Z0271 Encounter for disability determination: Secondary | ICD-10-CM

## 2016-06-24 ENCOUNTER — Telehealth: Payer: Self-pay | Admitting: Neurology

## 2016-06-24 NOTE — Telephone Encounter (Signed)
I have spoken with Alicia Barker this morning.  She c/o increased left sided numbness/sts. is dragging her left leg.  Ocrelizumab has not been scheduled yet.  Appt. given with RAS 1430 tomorrow--she is aware there may be a slight wait as this is a w/i appt/fim

## 2016-06-24 NOTE — Telephone Encounter (Addendum)
Patient is calling experiencing left side numbness for the past 4 days with a headache. Please call and discuss.

## 2016-06-25 ENCOUNTER — Encounter: Payer: Self-pay | Admitting: Neurology

## 2016-06-25 ENCOUNTER — Ambulatory Visit (INDEPENDENT_AMBULATORY_CARE_PROVIDER_SITE_OTHER): Payer: Self-pay | Admitting: Neurology

## 2016-06-25 VITALS — BP 118/82 | HR 78 | Resp 6 | Ht 64.0 in | Wt 216.0 lb

## 2016-06-25 DIAGNOSIS — R269 Unspecified abnormalities of gait and mobility: Secondary | ICD-10-CM

## 2016-06-25 DIAGNOSIS — R5383 Other fatigue: Secondary | ICD-10-CM

## 2016-06-25 DIAGNOSIS — F32A Depression, unspecified: Secondary | ICD-10-CM

## 2016-06-25 DIAGNOSIS — G35 Multiple sclerosis: Secondary | ICD-10-CM

## 2016-06-25 DIAGNOSIS — F329 Major depressive disorder, single episode, unspecified: Secondary | ICD-10-CM

## 2016-06-25 DIAGNOSIS — R350 Frequency of micturition: Secondary | ICD-10-CM

## 2016-06-25 MED ORDER — CYCLOBENZAPRINE HCL 5 MG PO TABS
ORAL_TABLET | ORAL | 3 refills | Status: DC
Start: 2016-06-25 — End: 2016-12-23

## 2016-06-25 MED ORDER — CITALOPRAM HYDROBROMIDE 40 MG PO TABS: 40.0000 mg | ORAL_TABLET | Freq: Every day | ORAL | 3 refills | Status: DC

## 2016-06-25 NOTE — Progress Notes (Signed)
[facility]  PATIENT: Alicia Barker DOB: 18-Apr-1986  REFERRING DOCTOR OR PCP:  PCP is Francoise Ceo SOURCE: patient, records from hospital, MRI / lab reports, MRI images on PACS  _________________________________   HISTORICAL  CHIEF COMPLAINT:  Chief Complaint  Patient presents with  . Multiple Sclerosis    Sts. she has had more right sided weakness over the last week.  Also sts. she is having new numbness left foot.  She has not started Ocrelizumab due to problems with Medicaid/fim    HISTORY OF PRESENT ILLNESS:  Alicia Barker is a 30 year old woman who was diagnosed with MS in March 2017 and started on Tecfidera April 2017.    She was tolerating Tecfidera well but in August had more difficulty with balance and slurred speech.    MRI 03/25/2016 showed several new enhancing lesions c/w active demyelination   She received IV steroids.     Gait/strength/sensation:   She is having more trouble.   About 5 days ago, she began to experience numbness in the right leg and this morning began to experience numbness in the left leg. She feels a more unsteady with her gait.  She veers and stumbles.    She has trouble lifting of the right leg.  No new hand numbness  Bladder/bowel: She has more urinary frequency and urgency. She has had a few episodes of incontinence. She also notes constipation.  Vision/vertigo: She notes that her vision is doing better.  Left and right are similar.   She has some diplopia but this is better.  Fatigue/sleep: She notes much more physical and cognitive fatigue.   Sleep is worse with more trouble staying asleep.   Her insomnia improved with nighttime cyclobenzaprine earlier this year but now she has more again.  Mood/cognition: She notes depression and anxiety and escitalopram helped a little bit but was too expensive.,  She reports apathy and poor sleep.  Cognitive processing speed appears to be slower.  Pain:  She is noting that the  stabbing-like pain going from her buttock down the left leg is worse again. Pain radiates to the foot at times.  MS History:   In early March, 2017, she had the onset of slurred speech and leg weakness and ataxia.    A head CT was reportedly normal.    Over the next couple weeks, she had right worse than left visual acuity issues, more numbness and more clumsiness.        Her gait became unsteady.     She also noted mid back pain.    She went to the ER and was diagnosed with an inner ear problem.   She went back to ER 11/15/15 and was found to have an abnormal MRI consistent with MS.    MRI of the spine also showed additional plaques.     She received 5 days of IV Steroids and noticed an improvement with improved gait but continued numbness.    I have reviewed the MRIs of the brain and spine performed for 12/30/2015 and 11/17/2015. The MRI of the brain with and without contrast shows multiple T2/FLAIR hyperintense foci, many in the periventricular white matter.    Another focus is the pons (with mild enhancement, better seen on c-spine MRI).Marland Kitchen 7 foci enhanced after gadolinium administration imply more recent MS plaques.    There are at least 2 small T2 hyperintense foci within the cervical spine, one of which appears to enhance slightly.    Another enhancing  focus is noted within the thoracic spine adjacent to T3-T4.   She started Tecfidera after I first saw her 11/28/2015.                                                                                        REVIEW OF SYSTEMS: Constitutional: No fevers, chills, sweats, or change in appetite.  She reports fatigue an dpoor sleep  Eyes: mild right visual changes, double vision, eye pain Ear, nose and throat: No hearing loss, ear pain, nasal congestion, sore throat Cardiovascular: No chest pain, palpitations Respiratory: No shortness of breath at rest or with exertion.   No wheezes GastrointestinaI: No nausea, vomiting, diarrhea, abdominal pain, fecal  incontinence Genitourinary: No dysuria, urinary retention.   She has some urgency and frequency..  No nocturia. Musculoskeletal: Reports neck pain, back pain Integumentary: No rash, pruritus, skin lesions Neurological: as above Psychiatric: Notes depression > anxiety.    Endocrine: No palpitations, diaphoresis, change in appetite, change in weigh or increased thirst Hematologic/Lymphatic: No anemia, purpura, petechiae. Allergic/Immunologic: No itchy/runny eyes, nasal congestion, recent allergic reactions, rashes  ALLERGIES: Allergies  Allergen Reactions  . Penicillins Anaphylaxis, Shortness Of Breath and Swelling    Has patient had a PCN reaction causing immediate rash, facial/tongue/throat swelling, SOB or lightheadedness with hypotension: YES Has patient had a PCN reaction causing severe rash involving mucus membranes or skin necrosis: NO Has patient had a PCN reaction that required hospitalization NO Has patient had a PCN reaction occurring within the last 10 years: NO If all of the above answers are "NO", then may proceed with Cephalosporin use.    HOME MEDICATIONS:  Current Outpatient Prescriptions:  .  acetaminophen-codeine (TYLENOL #3) 300-30 MG tablet, Take 1 tablet by mouth every 8 (eight) hours as needed for moderate pain., Disp: 90 tablet, Rfl: 3 .  cholecalciferol (VITAMIN D) 1000 units tablet, Take 5,000 Units by mouth daily., Disp: , Rfl:  .  cyclobenzaprine (FLEXERIL) 5 MG tablet, Take 1 tablet (5 mg total) by mouth 3 (three) times daily as needed for muscle spasms., Disp: 90 tablet, Rfl: 5 .  meclizine (ANTIVERT) 25 MG tablet, Take 1 tablet (25 mg total) by mouth 3 (three) times daily as needed for dizziness., Disp: 30 tablet, Rfl: 0 .  meloxicam (MOBIC) 15 MG tablet, Take 1 tablet (15 mg total) by mouth daily., Disp: 30 tablet, Rfl: 11 .  escitalopram (LEXAPRO) 20 MG tablet, Take 1 tablet (20 mg total) by mouth daily. (Patient not taking: Reported on 06/25/2016),  Disp: 30 tablet, Rfl: 11 .  methylPREDNISolone (MEDROL) 4 MG tablet, Take 6 pills po x 1 d, then 5 po x 1 day, then 4 po x 1d, then 3 po x 1d, then 2 po x 1d, then 1 po x 1d (Patient not taking: Reported on 06/25/2016), Disp: 21 tablet, Rfl: 0 .  miconazole (MICOTIN) 100 MG vaginal suppository, Place 1 suppository (100 mg total) vaginally at bedtime. (Patient not taking: Reported on 06/25/2016), Disp: 7 suppository, Rfl: 0 .  natalizumab (TYSABRI) 300 MG/15ML injection, Inject 300 mg into the vein every 30 (thirty) days., Disp: , Rfl:  .  omeprazole (PRILOSEC) 20  MG capsule, Take 1 capsule (20 mg total) by mouth daily. (Patient not taking: Reported on 06/25/2016), Disp: 30 capsule, Rfl: 3 .  ondansetron (ZOFRAN) 4 MG tablet, Take 1 tablet (4 mg total) by mouth every 6 (six) hours. Prn nausea or vomiting (Patient not taking: Reported on 06/25/2016), Disp: 12 tablet, Rfl: 0 .  ranitidine (ZANTAC) 150 MG tablet, Take 1 tablet (150 mg total) by mouth 2 (two) times daily. (Patient not taking: Reported on 06/25/2016), Disp: 60 tablet, Rfl: 1 .  traMADol (ULTRAM) 50 MG tablet, Take 1 tablet (50 mg total) by mouth every 8 (eight) hours as needed. (Patient not taking: Reported on 06/25/2016), Disp: 15 tablet, Rfl: 0  PAST MEDICAL HISTORY: Past Medical History:  Diagnosis Date  . Depression    no meds currently  . MS (multiple sclerosis) (HCC)   . SVD (spontaneous vaginal delivery)    x 5    PAST SURGICAL HISTORY: Past Surgical History:  Procedure Laterality Date  . LAPAROSCOPIC TUBAL LIGATION Bilateral 01/06/2013   Procedure: LAPAROSCOPIC TUBAL LIGATION;  Surgeon: Kathreen Cosier, MD;  Location: [facility];  Service: Gynecology;  Laterality: Bilateral;  . right hand surgery      FAMILY HISTORY: Family History  Problem Relation Age of Onset  . Diabetes Mother   . Heart disease Mother   . Diabetes Father   . Multiple sclerosis Cousin     SOCIAL HISTORY:  Social History   Social History  .  Marital status: Married    Spouse name: N/A  . Number of children: N/A  . Years of education: N/A   Occupational History  . Not on file.   Social History Main Topics  . Smoking status: Former Smoker    Packs/day: 0.10    Types: Cigarettes  . Smokeless tobacco: Never Used  . Alcohol use No  . Drug use: No  . Sexual activity: Yes    Birth control/ protection: None   Other Topics Concern  . Not on file   Social History Narrative  . No narrative on file     PHYSICAL EXAM  Vitals:   06/25/16 1449  BP: 118/82  Pulse: 78  Resp: (!) 6  Weight: 216 lb (98 kg)  Height: 5\' 4"  (1.626 m)    Body mass index is 37.08 kg/m.   General: The patient is well-developed and well-nourished and in no acute distress.  Mood:  She appears to have a depressed affect, though better than last visit.      Skin:   There is no rash. There is no pedal edema.    Neurologic Exam  Mental status: The patient is alert and oriented x 3 at the time of the examination. The patient has apparent normal recent and remote memory, with an apparently normal attention span and concentration ability.   Speech is normal.  Cranial nerves: Extraocular movements are full.  There is good facial sensation to  soft touch bilaterally.Facial strength is normal.  Trapezius and sternocleidomastoid strength is normal. No dysarthria is noted.  The tongue is midline, and the patient has symmetric elevation of the soft palate. No obvious hearing deficits are noted.  Motor:  Muscle bulk is normal.   Tone is normal. Strength is  5 / 5 in arms but she has 4+/5 right foot strength.   Sensory: Sensory testing shows reduced right leg sensation to temperature and vibration.     Coordination: Cerebellar testing reveals good finger-nose-finger and reduced right worse than left heel-to-shin .  RAM is slow in legs  Gait and station: Station is normal.   Gait is wide with unbalanced turn. Mild right foot drop.  She cannot tandem  gait and her Romberg sign is positive.  Reflexes: Deep tendon reflexes are symmetric and normal bilaterally.           DIAGNOSTIC DATA (LABS, IMAGING, TESTING) - I reviewed patient records, labs, notes, testing and imaging myself where available.  Lab Results  Component Value Date   WBC 17.2 (H) 03/28/2016   HGB 12.3 02/22/2016   HCT 35.6 03/28/2016   MCV 89 03/28/2016   PLT 331 03/28/2016      Component Value Date/Time   NA 137 02/23/2016 0553   K 3.9 02/23/2016 0553   CL 104 02/23/2016 0553   CO2 23 02/23/2016 0553   GLUCOSE 167 (H) 02/23/2016 0553   BUN 10 02/23/2016 0553   CREATININE 0.69 02/23/2016 0553   CALCIUM 9.5 02/23/2016 0553   PROT 6.5 04/16/2016 1119   ALBUMIN 3.9 04/16/2016 1119   AST 18 04/16/2016 1119   ALT 16 04/16/2016 1119   ALKPHOS 59 04/16/2016 1119   BILITOT <0.2 04/16/2016 1119   GFRNONAA >60 02/23/2016 0553   GFRAA >60 02/23/2016 0553       ASSESSMENT AND PLAN  Multiple sclerosis (HCC)  Other fatigue  Urinary frequency  Depression, unspecified depression type  Gait disturbance   1.    We are having some insurance problems getting her on Tysabri.   We will re-contact the infusion center to see if any way to expedite.   I'll have her get a gram of IV Solu-medrol while she is here today 2.    Citalopram for mood 3.    Flexeril 5 mg nightly for insomnia  Return in 3 months or sooner if there are new or worsening neurologic symptoms. We discussed symptoms of  possible exacerbations.   Candance Bohlman A. Epimenio FootSater, MD, PhD 06/25/2016, 5:11 PM Certified in Neurology, Clinical Neurophysiology, Sleep Medicine, Pain Medicine and Neuroimaging  [facility] [address], [address] [place] [address] [phone]

## 2016-06-25 NOTE — Telephone Encounter (Signed)
Pt called in stating she did not have her co-payment. I advised the pt it will be her 3rd no show and she may be dismissed from the practice. " That's ok I'm about to find a different doctor. Ya'll don't work with me. " I advised the pt our policy for every pt to have their co-pay for their appt or will not be able to see them. "Well, that's fine."

## 2016-06-25 NOTE — Telephone Encounter (Signed)
Pt. seen and treated in the office today.  She is having difficulty with getting Medicaid straightened out.  She is treated for exacerbated MS sx. at this time.   Dr. Epimenio FootSater will not dismiss her from our practice/fim

## 2016-07-09 ENCOUNTER — Ambulatory Visit: Payer: Medicaid Other | Admitting: Neurology

## 2016-09-18 IMAGING — MR MR THORACIC SPINE WO/W CM
5 of 10 series · 22 of 48 positions shown · IV contrast (multihance)
Comparison: None.

CLINICAL DATA: Demyelinating disease.  Brain lesions on recent MRI.

EXAM:
MRI THORACIC SPINE WITHOUT AND WITH CONTRAST
TECHNIQUE: Multiplanar and multiecho pulse sequences of the thoracic spine were
obtained without and with intravenous contrast.
CONTRAST:  20mL MULTIHANCE GADOBENATE DIMEGLUMINE 529 MG/ML IV SOLN

[Series 4: T2 · sagittal · 3.0mm · 0.66mm/px · 2 of 15 slices shown (1 of 3)]
[im 1/15]
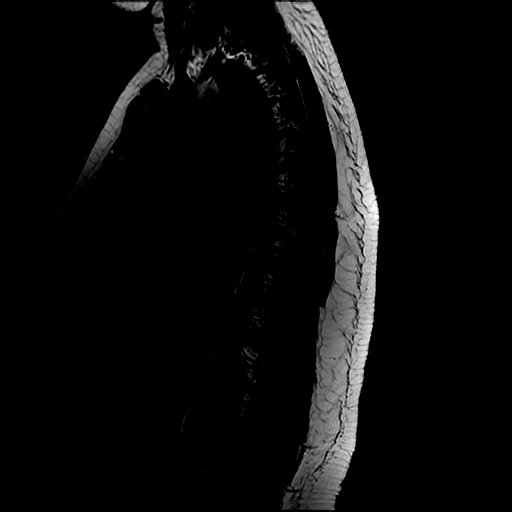
[im 15/15]
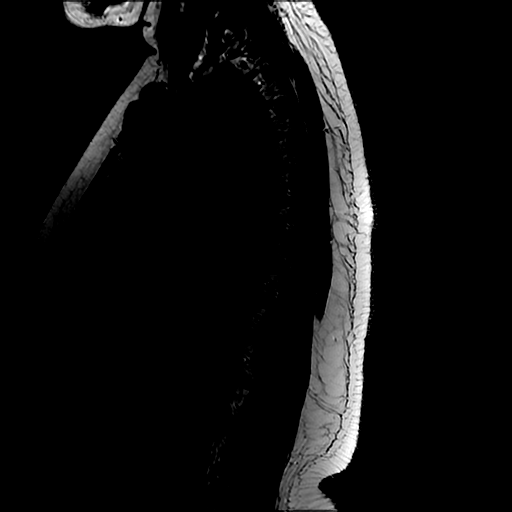

[Series 6: T1 · sagittal · 3.0mm · 0.66mm/px · 3 of 15 slices shown (1 of 2)]
[im 1/15]
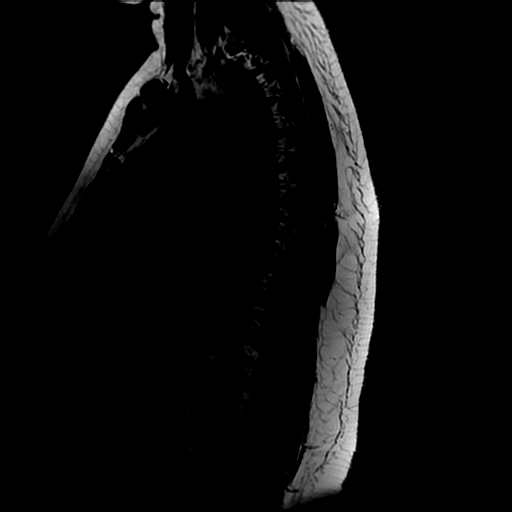
[im 8/15]
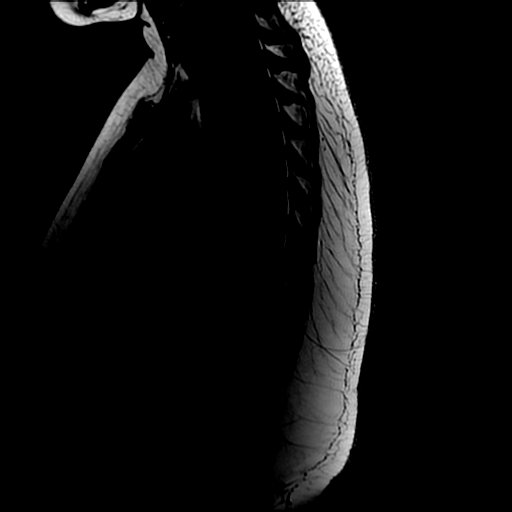
[im 15/15]
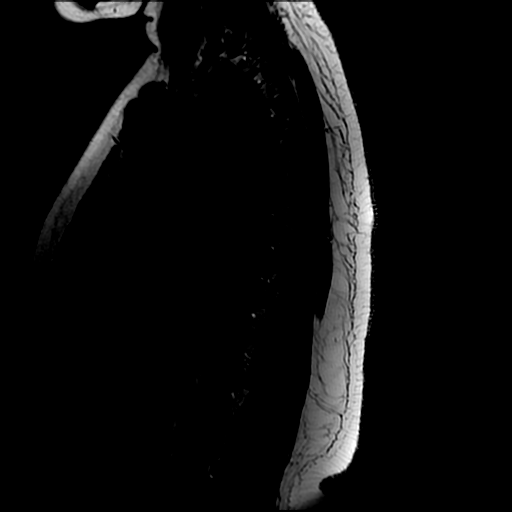

[Series 7: T2 · axial · 4.0mm · 0.39mm/px · z∈[-232,-57]mm · 7 of 33 slices shown (2 of 3)]
[im 1/33]
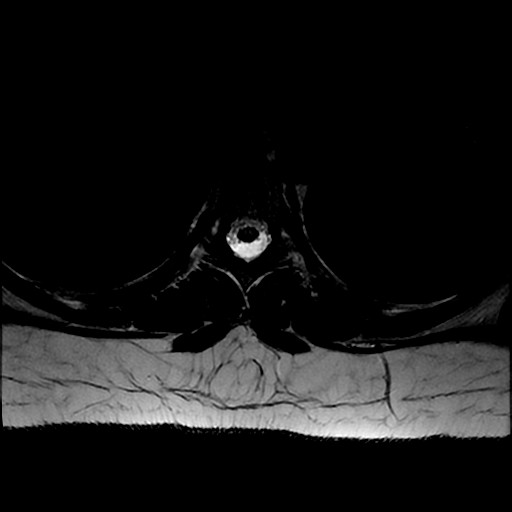
[im 6/33]
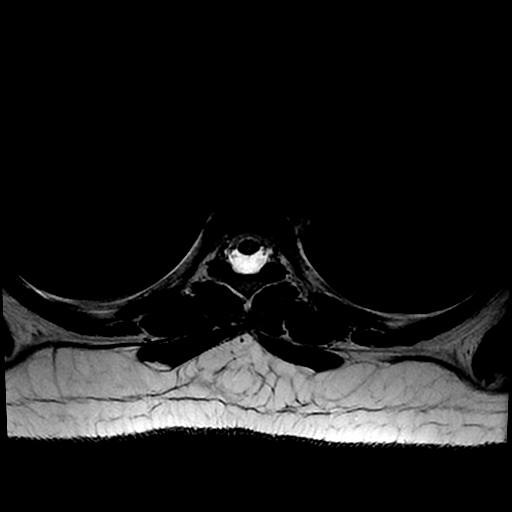
[im 11/33]
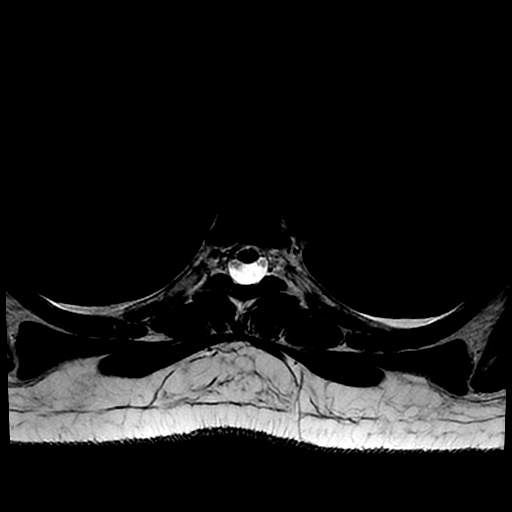
[im 17/33]
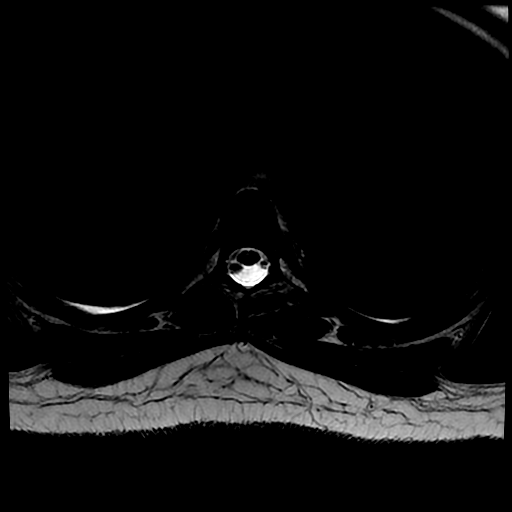
[im 22/33]
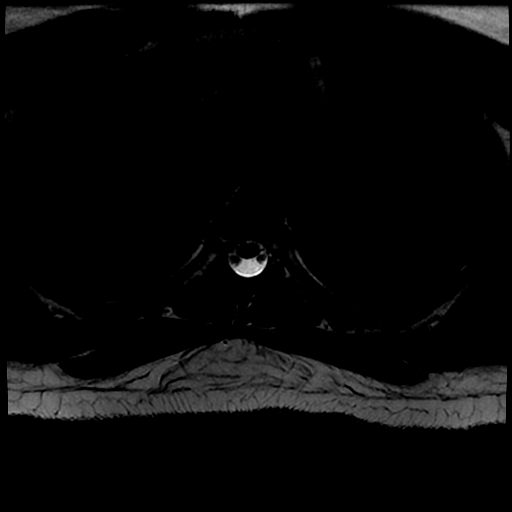
[im 27/33]
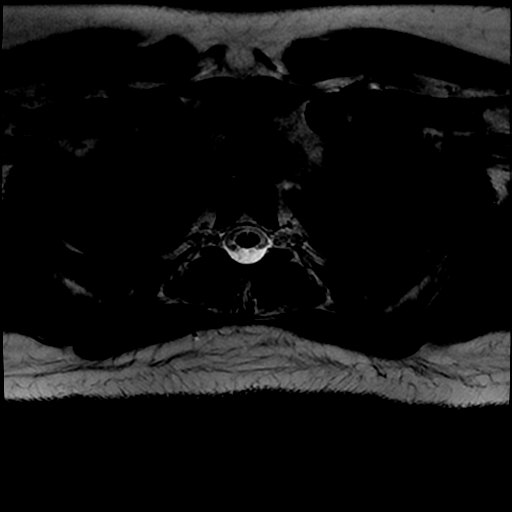
[im 33/33]
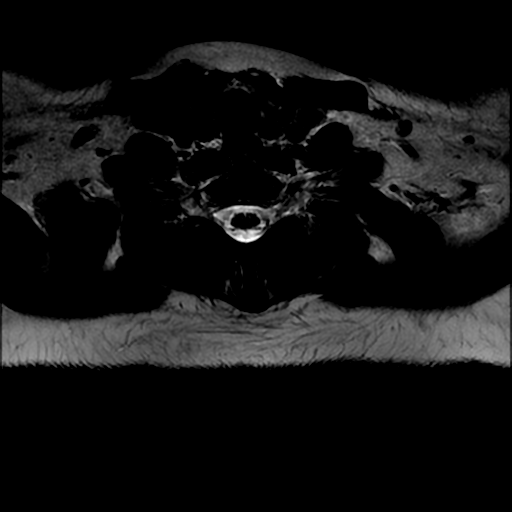

[Series 8: T2 · axial · 4.0mm · 0.39mm/px · z∈[-338,-188]mm · 6 of 29 slices shown (3 of 3)]
[im 1/29]
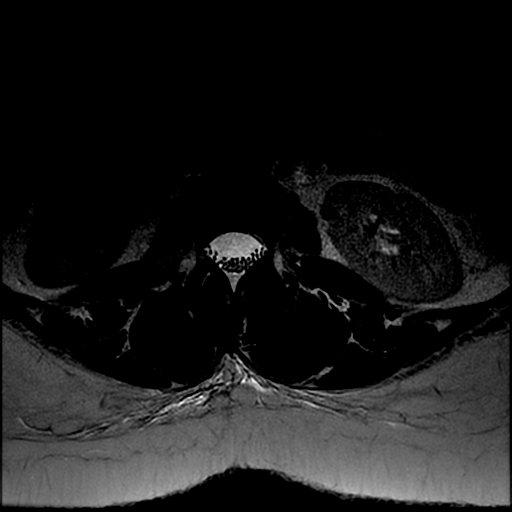
[im 6/29]
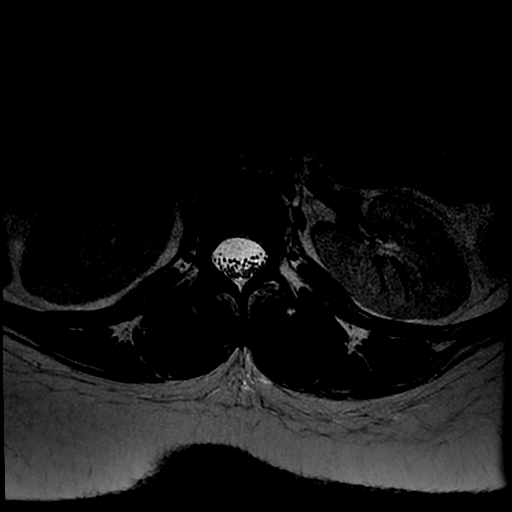
[im 12/29]
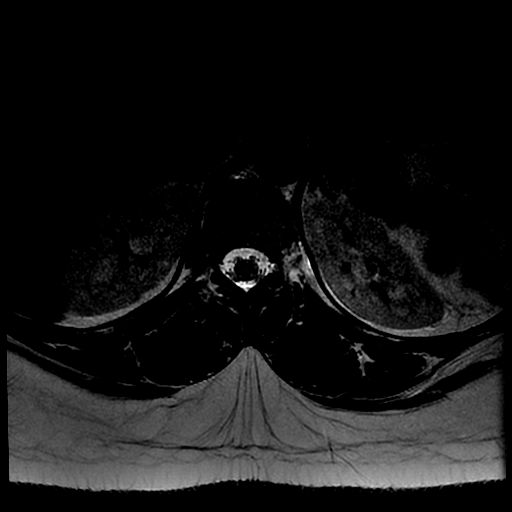
[im 17/29]
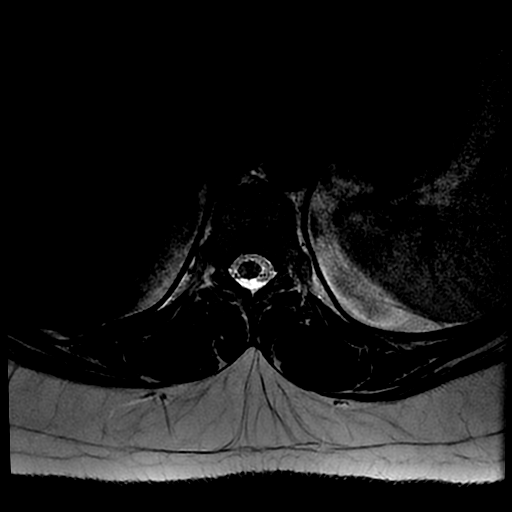
[im 23/29]
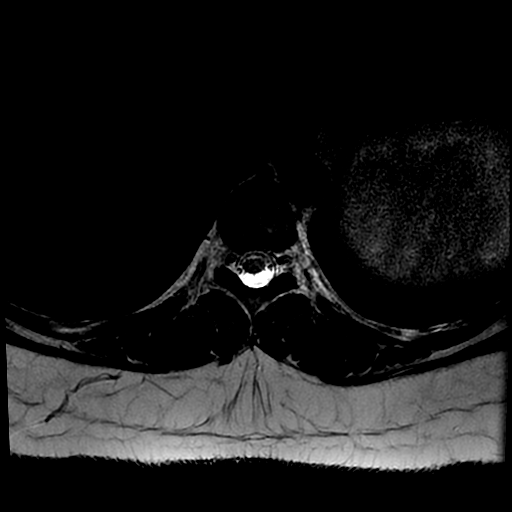
[im 29/29]
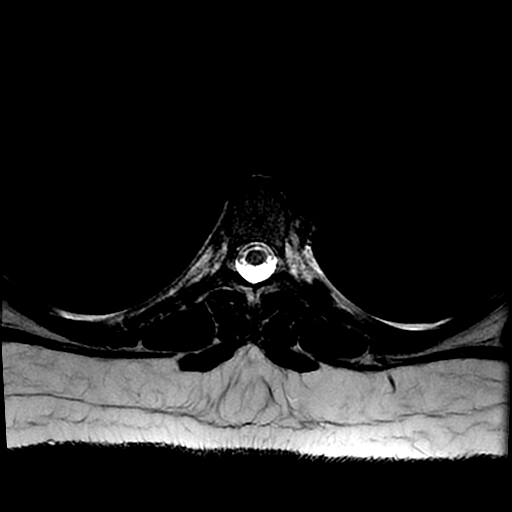

[Series 11: T1 · axial · non-contrast · 4.0mm · 0.78mm/px · z∈[-232,-144]mm · 4 of 33 slices shown (2 of 2)]
[im 1/33]
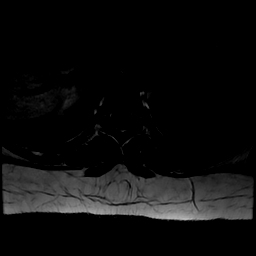
[im 6/33]
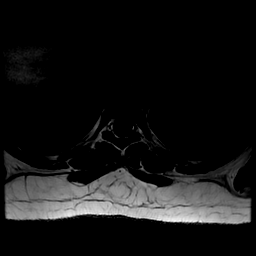
[im 11/33]
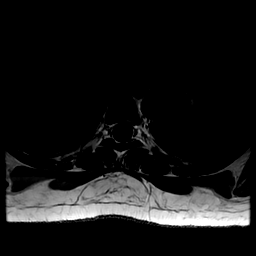
[im 17/33]
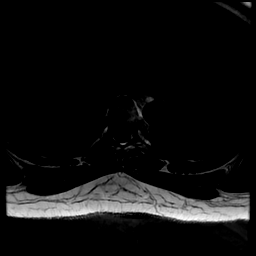

[22 of 48 positions shown; findings below may reference images not displayed]

FINDINGS: Vertebral alignment is normal paravertebral body heights and
intervertebral disc space heights are preserved. No significant
vertebral marrow edema is seen. No disc herniation, spinal stenosis,
or neural foraminal stenosis is identified. There are trace
bilateral pleural effusions.

The cervical scratched of the thoracic spinal cord is normal in
caliber. The conus medullaris terminates at T12-L1. There is an
approximately 5 mm focus of T2 hyperintensity in the central/ dorsal
cord at T3-4 with mild enhancement which is most apparent on axial
images. An additional punctate T2 lesion is questioned at T7-8 on
the sagittal STIR sequence with possible subtle T2 hyperintensity on
axial images and no evidence of enhancement.
IMPRESSION: 1. 5 mm mildly enhancing lesion in the spinal cord at T3-4
consistent with demyelinating disease.
2. Questionable nonenhancing lesion at T7-8.

## 2016-10-08 ENCOUNTER — Telehealth: Payer: Self-pay | Admitting: Neurology

## 2016-10-08 MED ORDER — ACETAMINOPHEN-CODEINE #3 300-30 MG PO TABS: 1.0000 | ORAL_TABLET | Freq: Three times a day (TID) | ORAL | 0 refills | Status: DC | PRN

## 2016-10-08 NOTE — Telephone Encounter (Signed)
Patient called office requesting refill for acetaminophen-codeine (TYLENOL #3) 300-30 MG tablet. °

## 2016-10-08 NOTE — Addendum Note (Signed)
Addended by: Candis Schatz I on: 10/08/2016 02:16 PM   Modules accepted: Orders

## 2016-10-08 NOTE — Telephone Encounter (Signed)
Rx. printed, awaiting RAS sig/fim 

## 2016-10-08 NOTE — Telephone Encounter (Signed)
Rx. up front [facility]/fim 

## 2016-11-14 ENCOUNTER — Ambulatory Visit: Payer: Self-pay | Admitting: Neurology

## 2016-12-20 ENCOUNTER — Ambulatory Visit: Payer: Medicaid Other | Admitting: Family Medicine

## 2016-12-23 ENCOUNTER — Ambulatory Visit (INDEPENDENT_AMBULATORY_CARE_PROVIDER_SITE_OTHER): Payer: Medicaid Other | Admitting: Family Medicine

## 2016-12-23 ENCOUNTER — Encounter: Payer: Self-pay | Admitting: Family Medicine

## 2016-12-23 VITALS — BP 110/74 | HR 86 | Temp 99.2°F | Ht 64.0 in | Wt 214.6 lb

## 2016-12-23 DIAGNOSIS — M25552 Pain in left hip: Secondary | ICD-10-CM | POA: Diagnosis not present

## 2016-12-23 DIAGNOSIS — G35 Multiple sclerosis: Secondary | ICD-10-CM

## 2016-12-23 DIAGNOSIS — M549 Dorsalgia, unspecified: Secondary | ICD-10-CM

## 2016-12-23 MED ORDER — CYCLOBENZAPRINE HCL 5 MG PO TABS: ORAL_TABLET | ORAL | 0 refills | Status: DC

## 2016-12-23 MED ORDER — METHYLPREDNISOLONE ACETATE 40 MG/ML IJ SUSP
40.0000 mg | Freq: Once | INTRAMUSCULAR | Status: AC
  Administered 2016-12-23: 40 mg via INTRAMUSCULAR

## 2016-12-23 NOTE — Assessment & Plan Note (Signed)
  Chronic.   -rx given for home walker to use as patient has difficulty ambulating at times due to weakness -follow up with neurology as scheduled (may 25)

## 2016-12-23 NOTE — Progress Notes (Signed)
    Subjective:    Patient ID: Alicia Barker, female    DOB: 1985/10/19, 31 y.o.   MRN: [id number]   CC: hip and back pain  Hip pain/back pain Hip pain is chronic, was told she has arthritis. Takes tylenol 3 for this. Back pain is new. Both started up 5 days ago when she woke up. Denies any trauma or inciting event. The pain is constant and sharp. Does not radiate. Occurs in her left hip (laterally) and her left shoulder. She also reports back pain down the center of her back. She has been taking tylenol 3 3-4 times a day and on top of that 2 pills of tylenol (325mg  each), she takes mobic and tramadol. None of these have been working.  Denies weakness. Denies this being like an MS flair- usually she has visual involvement with this. She is not currently taking any steroids or treatment for MS.   She reports chronic weakness and difficulty ambulating at times and is requesting a walker or cane to help her get to bathroom in the morning.  Smoking status reviewed- non-smoker  Review of Systems- see HPI   Objective:  BP 110/74   Pulse 86   Temp 99.2 F (37.3 C) (Oral)   Ht 5\' 4"  (1.626 m)   Wt 214 lb 9.6 oz (97.3 kg)   LMP 12/14/2016 (Exact Date)   SpO2 96%   BMI 36.84 kg/m  Vitals and nursing note reviewed  General: well nourished, in no acute distress Back: tender to palpation over left shoulder and down midline of back from T1-L5.  MSK: tender to palpation of left hip near greater trochanter Skin: warm and dry, no rashes noted Neuro: alert and oriented, no focal deficits  Greater Trochanter Injection Written and verbal consent was obtained after discussing the risks and benefits of the procedure with the patient. The lateral left hip was cleansed in a sterile fashion with betadine. 40 mg Depo-medrol and 3 cc 1% Lidocaine was injected using a 5 cc syringe and 25 gauge 11/2 in needle. No complications were encountered. No blood loss. A band aid was applied.   Assessment &  Plan:    Left hip pain  Chronic, not well controlled. More consistent with greater trochanteric bursitis  -bursa injected today with lidocaine and depomedrol -follow up if worsens or fails to improve   Acute upper back pain  Unclear etiology, acute in nature  -gave rx for 5mg  flexeril 30 pills to take as needed TID -follow up if worsens -advised heating pain or ice for relief -continue home medications  Multiple sclerosis (HCC)  Chronic.   -rx given for home walker to use as patient has difficulty ambulating at times due to weakness -follow up with neurology as scheduled (may 25)    Return if symptoms worsen or fail to improve.   Dolores Patty, DO Family Medicine Resident PGY-1

## 2016-12-23 NOTE — Assessment & Plan Note (Signed)
  Unclear etiology, acute in nature  -gave rx for 5mg  flexeril 30 pills to take as needed TID -follow up if worsens -advised heating pain or ice for relief -continue home medications

## 2016-12-23 NOTE — Patient Instructions (Signed)
  Please take prescription for walker to [facility] [address] [place]   Please call us with any questions or concerns! [phone]  I hope you feel better soon.   Dolores Patty, DO PGY-1, [facility] 12/23/2016 11:24 AM

## 2016-12-23 NOTE — Assessment & Plan Note (Signed)
  Chronic, not well controlled. More consistent with greater trochanteric bursitis  -bursa injected today with lidocaine and depomedrol -follow up if worsens or fails to improve

## 2017-01-02 ENCOUNTER — Telehealth: Payer: Self-pay | Admitting: Neurology

## 2017-01-02 MED ORDER — ACETAMINOPHEN-CODEINE #3 300-30 MG PO TABS: 1.0000 | ORAL_TABLET | Freq: Three times a day (TID) | ORAL | 0 refills | Status: DC | PRN

## 2017-01-02 NOTE — Addendum Note (Signed)
Addended by: Candis Schatz I on: 01/02/2017 02:46 PM   Modules accepted: Orders

## 2017-01-02 NOTE — Telephone Encounter (Signed)
Rx. up front [facility]/fim 

## 2017-01-02 NOTE — Telephone Encounter (Signed)
Patient called office requesting refill for acetaminophen-codeine (TYLENOL #3) 300-30 MG tablet.

## 2017-01-03 ENCOUNTER — Encounter: Payer: Self-pay | Admitting: Neurology

## 2017-01-03 ENCOUNTER — Ambulatory Visit (INDEPENDENT_AMBULATORY_CARE_PROVIDER_SITE_OTHER): Payer: Medicaid Other | Admitting: Neurology

## 2017-01-03 ENCOUNTER — Other Ambulatory Visit: Payer: Self-pay | Admitting: *Deleted

## 2017-01-03 VITALS — BP 106/75 | HR 65 | Resp 18 | Ht 64.0 in | Wt 216.5 lb

## 2017-01-03 DIAGNOSIS — R350 Frequency of micturition: Secondary | ICD-10-CM | POA: Diagnosis not present

## 2017-01-03 DIAGNOSIS — M5432 Sciatica, left side: Secondary | ICD-10-CM | POA: Diagnosis not present

## 2017-01-03 DIAGNOSIS — F329 Major depressive disorder, single episode, unspecified: Secondary | ICD-10-CM

## 2017-01-03 DIAGNOSIS — G35 Multiple sclerosis: Secondary | ICD-10-CM

## 2017-01-03 DIAGNOSIS — R3989 Other symptoms and signs involving the genitourinary system: Secondary | ICD-10-CM

## 2017-01-03 DIAGNOSIS — R5383 Other fatigue: Secondary | ICD-10-CM

## 2017-01-03 DIAGNOSIS — R269 Unspecified abnormalities of gait and mobility: Secondary | ICD-10-CM | POA: Diagnosis not present

## 2017-01-03 DIAGNOSIS — F32A Depression, unspecified: Secondary | ICD-10-CM

## 2017-01-03 MED ORDER — CITALOPRAM HYDROBROMIDE 40 MG PO TABS: 40.0000 mg | ORAL_TABLET | Freq: Every day | ORAL | 3 refills | Status: DC

## 2017-01-03 MED ORDER — ACETAMINOPHEN-CODEINE #3 300-30 MG PO TABS
1.0000 | ORAL_TABLET | Freq: Three times a day (TID) | ORAL | 2 refills | Status: DC | PRN
Start: 2017-01-03 — End: 2017-03-12

## 2017-01-03 MED ORDER — OXYBUTYNIN CHLORIDE 5 MG PO TABS: 5.0000 mg | ORAL_TABLET | Freq: Two times a day (BID) | ORAL | 3 refills | Status: DC

## 2017-01-03 MED ORDER — CYCLOBENZAPRINE HCL 5 MG PO TABS: 5.0000 mg | ORAL_TABLET | Freq: Every day | ORAL | 1 refills | Status: DC

## 2017-01-03 NOTE — Progress Notes (Signed)
[facility]  PATIENT: Alicia Barker DOB: [birth year]  REFERRING DOCTOR OR PCP:  PCP is Francoise Ceo SOURCE: patient, records from hospital, MRI / lab reports, MRI images on PACS  _________________________________   HISTORICAL  CHIEF COMPLAINT:  Chief Complaint  Patient presents with  . Multiple Sclerosis    Sts. she is tolerating Ocrevus well.  Next infusion is scheduled for June. Sts. she is having more lbp. Sts. urine has been darker than normal./fim    HISTORY OF PRESENT ILLNESS:  Alicia Barker is a 31 year old woman who was diagnosed with MS in March 2017  MS:  She was started on Tecfidera April 2017.    She was tolerating Tecfidera well but in August had more difficulty with balance and slurred speech.    MRI 03/25/2016 showed several new enhancing lesions c/w active demyelination   She received IV steroids and started Ocrevus, first dose in December.  .     Gait/strength/sensation:   She had a large exacerbation last year and had reduced gait and leg numbness, first on the right and then on the left.   She feels a more unsteady with her gait.  She veers and stumbles but no falls.   Sometimes, she uses a walker.    She has trouble lifting the left leg.  The right leg was weaker last year.   No new hand numbness.    The left leg is now painful and the pain as stabbing.. Gabapentin had not helped in the past    Bladder/bowel: She has more urinary frequency and urgency with occasional urinary incontinence.   She has rare bowel incontinence.    Vision/vertigo: She notes that her vision is doing better.  Left and right are similar.   She has some diplopia but this is better.  Fatigue/sleep: She notes a lot of fatigue, physical > mental.   Sleep is worse with more trouble staying asleep.   Her insomnia improved with nighttime cyclobenzaprine earlier this year but now she has more again.  Mood/cognition: She notes depression and anxiety.   She gets irritable  easily.     She also reports apathy and poor sleep.  Cognitive processing speed appears to be slower.   MS History:   In early March, 2017, she had the onset of slurred speech and leg weakness and ataxia.    A head CT was reportedly normal.    Over the next couple weeks, she had right worse than left visual acuity issues, more numbness and more clumsiness.        Her gait became unsteady.     She also noted mid back pain.    She went to the ER and was diagnosed with an inner ear problem.   She went back to ER 11/15/15 and was found to have an abnormal MRI consistent with MS.    MRI of the spine also showed additional plaques.     She received 5 days of IV Steroids and noticed an improvement with improved gait but continued numbness.    I have reviewed the MRIs of the brain and spine performed for 12/30/2015 and 11/17/2015. The MRI of the brain with and without contrast shows multiple T2/FLAIR hyperintense foci, many in the periventricular white matter.    Another focus is the pons (with mild enhancement, better seen on c-spine MRI).Marland Kitchen 7 foci enhanced after gadolinium administration imply more recent MS plaques.    There are at least 2 small T2 hyperintense foci  within the cervical spine, one of which appears to enhance slightly.    Another enhancing focus is noted within the thoracic spine adjacent to T3-T4.   She started Tecfidera after I first saw her 11/28/2015.   She had a significant relapse and was switched over to ocrelizumab if her first dose around January or February 2018.                                                                                       REVIEW OF SYSTEMS: Constitutional: No fevers, chills, sweats, or change in appetite.  She reports fatigue and poor sleep  Eyes: mild right visual changes, double vision, eye pain Ear, nose and throat: No hearing loss, ear pain, nasal congestion, sore throat Cardiovascular: No chest pain, palpitations Respiratory: No shortness of breath at rest or  with exertion.   No wheezes GastrointestinaI: No nausea, vomiting, diarrhea, abdominal pain, fecal incontinence Genitourinary: No dysuria, urinary retention.   She has some urgency and frequency..  She has some nocturia. Musculoskeletal: Reports neck pain, back pain Integumentary: No rash, pruritus, skin lesions Neurological: as above Psychiatric: Notes depression > anxiety.    Endocrine: No palpitations, diaphoresis, change in appetite, change in weigh or increased thirst Hematologic/Lymphatic: No anemia, purpura, petechiae. Allergic/Immunologic: No itchy/runny eyes, nasal congestion, recent allergic reactions, rashes  ALLERGIES: Allergies  Allergen Reactions  . Penicillins Anaphylaxis, Shortness Of Breath and Swelling    Has patient had a PCN reaction causing immediate rash, facial/tongue/throat swelling, SOB or lightheadedness with hypotension: YES Has patient had a PCN reaction causing severe rash involving mucus membranes or skin necrosis: NO Has patient had a PCN reaction that required hospitalization NO Has patient had a PCN reaction occurring within the last 10 years: NO If all of the above answers are "NO", then may proceed with Cephalosporin use.    HOME MEDICATIONS:  Current Outpatient Prescriptions:  .  acetaminophen-codeine (TYLENOL #3) 300-30 MG tablet, Take 1 tablet by mouth every 8 (eight) hours as needed for moderate pain., Disp: 90 tablet, Rfl: 2 .  cholecalciferol (VITAMIN D) 1000 units tablet, Take 5,000 Units by mouth daily., Disp: , Rfl:  .  citalopram (CELEXA) 40 MG tablet, Take 1 tablet (40 mg total) by mouth daily., Disp: 90 tablet, Rfl: 3 .  cyclobenzaprine (FLEXERIL) 5 MG tablet, Take 1 tablet (5 mg total) by mouth at bedtime. Take up to 3 times a day, Disp: 90 tablet, Rfl: 1 .  escitalopram (LEXAPRO) 20 MG tablet, Take 1 tablet (20 mg total) by mouth daily., Disp: 30 tablet, Rfl: 11 .  meclizine (ANTIVERT) 25 MG tablet, Take 1 tablet (25 mg total) by mouth  3 (three) times daily as needed for dizziness., Disp: 30 tablet, Rfl: 0 .  meloxicam (MOBIC) 15 MG tablet, Take 1 tablet (15 mg total) by mouth daily., Disp: 30 tablet, Rfl: 11 .  methylPREDNISolone (MEDROL) 4 MG tablet, Take 6 pills po x 1 d, then 5 po x 1 day, then 4 po x 1d, then 3 po x 1d, then 2 po x 1d, then 1 po x 1d, Disp: 21 tablet, Rfl: 0 .  miconazole (MICOTIN) 100 MG  vaginal suppository, Place 1 suppository (100 mg total) vaginally at bedtime., Disp: 7 suppository, Rfl: 0 .  ocrelizumab 600 mg in sodium chloride 0.9 % 500 mL, Inject 600 mg into the vein once., Disp: , Rfl:  .  omeprazole (PRILOSEC) 20 MG capsule, Take 1 capsule (20 mg total) by mouth daily., Disp: 30 capsule, Rfl: 3 .  ondansetron (ZOFRAN) 4 MG tablet, Take 1 tablet (4 mg total) by mouth every 6 (six) hours. Prn nausea or vomiting, Disp: 12 tablet, Rfl: 0 .  traMADol (ULTRAM) 50 MG tablet, Take 1 tablet (50 mg total) by mouth every 8 (eight) hours as needed., Disp: 15 tablet, Rfl: 0 .  oxybutynin (DITROPAN) 5 MG tablet, Take 1 tablet (5 mg total) by mouth 2 (two) times daily., Disp: 180 tablet, Rfl: 3 .  ranitidine (ZANTAC) 150 MG tablet, Take 1 tablet (150 mg total) by mouth 2 (two) times daily. (Patient not taking: Reported on 06/25/2016), Disp: 60 tablet, Rfl: 1  PAST MEDICAL HISTORY: Past Medical History:  Diagnosis Date  . Depression    no meds currently  . MS (multiple sclerosis) (HCC)   . SVD (spontaneous vaginal delivery)    x 5    PAST SURGICAL HISTORY: Past Surgical History:  Procedure Laterality Date  . LAPAROSCOPIC TUBAL LIGATION Bilateral 01/06/2013   Procedure: LAPAROSCOPIC TUBAL LIGATION;  Surgeon: Kathreen Cosier, MD;  Location: [facility];  Service: Gynecology;  Laterality: Bilateral;  . right hand surgery      FAMILY HISTORY: Family History  Problem Relation Age of Onset  . Diabetes Mother   . Heart disease Mother   . Diabetes Father   . Multiple sclerosis Cousin     SOCIAL  HISTORY:  Social History   Social History  . Marital status: Married    Spouse name: N/A  . Number of children: N/A  . Years of education: N/A   Occupational History  . Not on file.   Social History Main Topics  . Smoking status: Former Smoker    Packs/day: 0.10    Types: Cigarettes  . Smokeless tobacco: Never Used  . Alcohol use No  . Drug use: No  . Sexual activity: Yes    Birth control/ protection: None   Other Topics Concern  . Not on file   Social History Narrative  . No narrative on file     PHYSICAL EXAM  Vitals:   01/03/17 0926  BP: 106/75  Pulse: 65  Resp: 18  Weight: 216 lb 8 oz (98.2 kg)  Height: 5\' 4"  (1.626 m)    Body mass index is 37.16 kg/m.   General: The patient is well-developed and well-nourished and in no acute distress.   -Skin:   There is no rash. There is no pedal edema.    Neurologic Exam  Mental status: The patient is alert and oriented x 3 at the time of the examination. The patient has apparent normal recent and remote memory, with an apparently normal attention span and concentration ability.   Speech is normal.  Cranial nerves: Extraocular movements are full.  Facial strength and sensation is normal. Trapezius and sternocleidomastoid strength is normal. No dysarthria is noted.  The tongue is midline, and the patient has symmetric elevation of the soft palate. No obvious hearing deficits are noted.  Motor:  Muscle bulk is normal.   Tone is normal. Strength is  5 / 5 in arms but she has 4+/5 right foot strength.   Sensory: Sensory testing shows  reduced left leg sensation to vibration.   Touch sensation is normal.  Coordination: Finger-nose-finger is normal. She has mildly reduced heel-to-shin in the legs, a little worse on the left .   RAM is slow in legs  Gait and station: Station is normal.   Gait is wide and her turns are unstable. She has a mild right foot drop.  She cannot tandem gait and her Romberg sign is borderline  positive.  Reflexes: Deep tendon reflexes are symmetric and normal bilaterally.           DIAGNOSTIC DATA (LABS, IMAGING, TESTING) - I reviewed patient records, labs, notes, testing and imaging myself where available.  Lab Results  Component Value Date   WBC 17.2 (H) 03/28/2016   HGB 12.3 02/22/2016   HCT 35.6 03/28/2016   MCV 89 03/28/2016   PLT 331 03/28/2016      Component Value Date/Time   NA 137 02/23/2016 0553   K 3.9 02/23/2016 0553   CL 104 02/23/2016 0553   CO2 23 02/23/2016 0553   GLUCOSE 167 (H) 02/23/2016 0553   BUN 10 02/23/2016 0553   CREATININE 0.69 02/23/2016 0553   CALCIUM 9.5 02/23/2016 0553   PROT 6.5 04/16/2016 1119   ALBUMIN 3.9 04/16/2016 1119   AST 18 04/16/2016 1119   ALT 16 04/16/2016 1119   ALKPHOS 59 04/16/2016 1119   BILITOT <0.2 04/16/2016 1119   GFRNONAA >60 02/23/2016 0553   GFRAA >60 02/23/2016 0553       ASSESSMENT AND PLAN  Multiple sclerosis (HCC)  Gait disturbance  Left sided sciatica  Other fatigue  Depression, unspecified depression type   1.    Continue ocrelizumab. Her next dose will be in July. 2.    Continue citalopram for mood 3.    Flexeril 5 mg nightly for insomnia 4.    Add oxybutynin for the bladder.    Renew Tylenol No. 3 for pain.  Return in 3 months or sooner if there are new or worsening neurologic symptoms. We discussed symptoms of  possible exacerbations.   Nahshon Reich A. Epimenio Foot, MD, PhD 01/03/2017, 10:04 AM Certified in Neurology, Clinical Neurophysiology, Sleep Medicine, Pain Medicine and Neuroimaging  [facility] [address] [place], [place] [address] [phone]

## 2017-01-03 NOTE — Addendum Note (Signed)
Addended by: Margo Aye on: 01/03/2017 10:32 AM   Modules accepted: Orders

## 2017-01-04 LAB — URINALYSIS, ROUTINE W REFLEX MICROSCOPIC
Bilirubin, UA: NEGATIVE
Glucose, UA: NEGATIVE
Ketones, UA: NEGATIVE
NITRITE UA: NEGATIVE
PH UA: 7.5 (ref 5.0–7.5)
Specific Gravity, UA: 1.021 (ref 1.005–1.030)
UUROB: 0.2 mg/dL (ref 0.2–1.0)

## 2017-01-04 LAB — MICROSCOPIC EXAMINATION: CASTS: NONE SEEN /LPF

## 2017-01-08 LAB — URINE CULTURE

## 2017-01-09 ENCOUNTER — Telehealth: Payer: Self-pay | Admitting: Neurology

## 2017-01-09 MED ORDER — SULFAMETHOXAZOLE-TRIMETHOPRIM 800-160 MG PO TABS: 1.0000 | ORAL_TABLET | Freq: Two times a day (BID) | ORAL | 0 refills | Status: DC

## 2017-01-09 NOTE — Telephone Encounter (Signed)
I spoke to Mrs. Zazueta. The urine culture grew back bacteria. I will call in an antibiotic. She expressed understanding.

## 2017-01-10 ENCOUNTER — Telehealth: Payer: Self-pay | Admitting: *Deleted

## 2017-01-10 NOTE — Telephone Encounter (Signed)
RAS completed this yesterday--spoke with pt. and he called in med/fim

## 2017-01-10 NOTE — Telephone Encounter (Signed)
-----   Message from Asa Lente, MD sent at 01/09/2017  5:07 PM EDT ----- Her UA did show some bacteria. Please call in cefuroxime 250 mg daily 10 days.

## 2017-01-24 ENCOUNTER — Ambulatory Visit: Payer: Medicaid Other | Admitting: Family Medicine

## 2017-03-12 ENCOUNTER — Telehealth: Payer: Self-pay | Admitting: *Deleted

## 2017-03-12 ENCOUNTER — Ambulatory Visit (INDEPENDENT_AMBULATORY_CARE_PROVIDER_SITE_OTHER): Payer: Medicaid Other | Admitting: Neurology

## 2017-03-12 ENCOUNTER — Encounter: Payer: Self-pay | Admitting: Neurology

## 2017-03-12 VITALS — BP 104/58 | HR 76 | Resp 16 | Ht 64.0 in | Wt 215.0 lb

## 2017-03-12 DIAGNOSIS — R350 Frequency of micturition: Secondary | ICD-10-CM

## 2017-03-12 DIAGNOSIS — Z79899 Other long term (current) drug therapy: Secondary | ICD-10-CM

## 2017-03-12 DIAGNOSIS — M542 Cervicalgia: Secondary | ICD-10-CM | POA: Insufficient documentation

## 2017-03-12 DIAGNOSIS — H539 Unspecified visual disturbance: Secondary | ICD-10-CM

## 2017-03-12 DIAGNOSIS — R270 Ataxia, unspecified: Secondary | ICD-10-CM | POA: Diagnosis not present

## 2017-03-12 DIAGNOSIS — R200 Anesthesia of skin: Secondary | ICD-10-CM | POA: Diagnosis not present

## 2017-03-12 DIAGNOSIS — R5383 Other fatigue: Secondary | ICD-10-CM

## 2017-03-12 DIAGNOSIS — G35 Multiple sclerosis: Secondary | ICD-10-CM

## 2017-03-12 MED ORDER — MIRABEGRON ER 50 MG PO TB24: 50.0000 mg | ORAL_TABLET | Freq: Every day | ORAL | 11 refills | Status: DC

## 2017-03-12 MED ORDER — TRAMADOL HCL 50 MG PO TABS: 50.0000 mg | ORAL_TABLET | Freq: Three times a day (TID) | ORAL | 0 refills | Status: DC | PRN

## 2017-03-12 MED ORDER — ACETAMINOPHEN-CODEINE #3 300-30 MG PO TABS: 1.0000 | ORAL_TABLET | Freq: Three times a day (TID) | ORAL | 2 refills | Status: DC | PRN

## 2017-03-12 MED ORDER — CYCLOBENZAPRINE HCL 5 MG PO TABS: 5.0000 mg | ORAL_TABLET | Freq: Every day | ORAL | 4 refills | Status: DC

## 2017-03-12 NOTE — Telephone Encounter (Signed)
PA for Myrbetriq 50mg  #30/30 completed by phone with [employer].  Pt. has tried and failed Oxybutynin and Vesicare.  Myrbetriq approved thru 02/24/18.  PA# [id number].  ID# for this call [id number]/fim

## 2017-03-12 NOTE — Progress Notes (Signed)
[facility]  PATIENT: Alicia Barker DOB: Dec 03, 1985  REFERRING DOCTOR OR PCP:  PCP is Francoise Ceo SOURCE: patient, records from hospital, MRI / lab reports, MRI images on PACS  _________________________________   HISTORICAL  CHIEF COMPLAINT:  Chief Complaint  Patient presents with  . Multiple Sclerosis    Sts. she continues to tolerate Ocrevus well.  Last infusion was 03/10/17.  She c/o generalized h/a, neck pain onset 2 days ago/fim    HISTORY OF PRESENT ILLNESS:  Alicia Barker is a 31 year old woman who was diagnosed with MS in March 2017.   She feels the MS is mostly stabel but she has a HA x 2 days.    MS:  She switched from Tecfidera to [facility] in December 2017/January 2018 and just had her second infusion.    She has no new exacerbation.  She was started on Tecfidera April 2017.    She was tolerating Tecfidera well but in August had more difficulty with balance and slurred speech.    MRI 03/25/2016 showed several new enhancing lesions c/w active demyelination   She received IV steroids and started Ocrevus, first dose in December.  .     Gait/strength/sensation:   Since the large exacerbation in 2017, She reports reports left > right leg weakness and clumsiness.   Gait is unsteady.   She has fallen a few times.  She uses a cane sometimes but no longer needs a walker. The right leg was weaker before the large exacerbation.   No hand numbness.    The left leg has painful dysesthesias with a stabbing quality.     Gabapentin had not helped in the past    Bladder/bowel: She has more urinary frequency and urgency with occasional urinary incontinence. Oxybutynin has not helped much.   She has rare bowel incontinence.     Vision/vertigo: She notes some visual blurring since last year.   She has some diplopia but this is better.  Fatigue/sleep: She notes a lot of fatigue, physical > mental.   Sleep is worse with more trouble staying asleep.   Her insomnia  improved with nighttime cyclobenzaprine earlier this year but now she has more again.  Mood/cognition: She is having some depression and anxiety that worsened last year and has persisted despite escitalopram.     She gets irritable easily and has a short fuse.     She also reports apathy and poor sleep.  Cognitive processing speed appears to be slower.  HA:   She has pain in the temples and the neck since Monday.   Pain worsens when she stands or moves and is best when she is still.   She denies N/V but has photophobia and phonophobia.   This is her worst HA in years but similar to ones she had many years ago.   Tylenol #3 and Ibuprofen have not helped.     MS History:   In early March, 2017, she had the onset of slurred speech and leg weakness and ataxia.    A head CT was reportedly normal.    Over the next couple weeks, she had right worse than left visual acuity issues, more numbness and more clumsiness.        Her gait became unsteady.     She also noted mid back pain.    She went to the ER and was diagnosed with an inner ear problem.   She went back to ER 11/15/15 and was found to have  an abnormal MRI consistent with MS.    MRI of the spine also showed additional plaques.     She received 5 days of IV Steroids and noticed an improvement with improved gait but continued numbness.    I have reviewed the MRIs of the brain and spine performed for 12/30/2015 and 11/17/2015. The MRI of the brain with and without contrast shows multiple T2/FLAIR hyperintense foci, many in the periventricular white matter.    Another focus is the pons (with mild enhancement, better seen on c-spine MRI).Marland Kitchen 7 foci enhanced after gadolinium administration imply more recent MS plaques.    There are at least 2 small T2 hyperintense foci within the cervical spine, one of which appears to enhance slightly.    Another enhancing focus is noted within the thoracic spine adjacent to T3-T4.   She started Tecfidera after I first saw her  11/28/2015.   She had a significant relapse and was switched over to ocrelizumab if her first dose around January or February 2018.                                                                                       REVIEW OF SYSTEMS: Constitutional: No fevers, chills, sweats, or change in appetite.  She reports fatigue and poor sleep  Eyes: mild right visual changes, double vision, eye pain Ear, nose and throat: No hearing loss, ear pain, nasal congestion, sore throat Cardiovascular: No chest pain, palpitations Respiratory: No shortness of breath at rest or with exertion.   No wheezes GastrointestinaI: No nausea, vomiting, diarrhea, abdominal pain, fecal incontinence Genitourinary: No dysuria, urinary retention.   She has some urgency and frequency..  She has some nocturia. Musculoskeletal: Reports neck pain, back pain Integumentary: No rash, pruritus, skin lesions Neurological: as above Psychiatric: Notes depression > anxiety.    Endocrine: No palpitations, diaphoresis, change in appetite, change in weigh or increased thirst Hematologic/Lymphatic: No anemia, purpura, petechiae. Allergic/Immunologic: No itchy/runny eyes, nasal congestion, recent allergic reactions, rashes  ALLERGIES: Allergies  Allergen Reactions  . Penicillins Anaphylaxis, Shortness Of Breath and Swelling    Has patient had a PCN reaction causing immediate rash, facial/tongue/throat swelling, SOB or lightheadedness with hypotension: YES Has patient had a PCN reaction causing severe rash involving mucus membranes or skin necrosis: NO Has patient had a PCN reaction that required hospitalization NO Has patient had a PCN reaction occurring within the last 10 years: NO If all of the above answers are "NO", then may proceed with Cephalosporin use.    HOME MEDICATIONS:  Current Outpatient Prescriptions:  .  acetaminophen-codeine (TYLENOL #3) 300-30 MG tablet, Take 1 tablet by mouth every 8 (eight) hours as needed for  moderate pain., Disp: 90 tablet, Rfl: 2 .  cholecalciferol (VITAMIN D) 1000 units tablet, Take 5,000 Units by mouth daily., Disp: , Rfl:  .  citalopram (CELEXA) 40 MG tablet, Take 1 tablet (40 mg total) by mouth daily., Disp: 90 tablet, Rfl: 3 .  cyclobenzaprine (FLEXERIL) 5 MG tablet, Take 1 tablet (5 mg total) by mouth at bedtime. Take up to 3 times a day, Disp: 90 tablet, Rfl: 4 .  escitalopram (LEXAPRO) 20 MG  tablet, Take 1 tablet (20 mg total) by mouth daily., Disp: 30 tablet, Rfl: 11 .  meclizine (ANTIVERT) 25 MG tablet, Take 1 tablet (25 mg total) by mouth 3 (three) times daily as needed for dizziness., Disp: 30 tablet, Rfl: 0 .  meloxicam (MOBIC) 15 MG tablet, Take 1 tablet (15 mg total) by mouth daily., Disp: 30 tablet, Rfl: 11 .  methylPREDNISolone (MEDROL) 4 MG tablet, Take 6 pills po x 1 d, then 5 po x 1 day, then 4 po x 1d, then 3 po x 1d, then 2 po x 1d, then 1 po x 1d, Disp: 21 tablet, Rfl: 0 .  miconazole (MICOTIN) 100 MG vaginal suppository, Place 1 suppository (100 mg total) vaginally at bedtime., Disp: 7 suppository, Rfl: 0 .  ocrelizumab 600 mg in sodium chloride 0.9 % 500 mL, Inject 600 mg into the vein once., Disp: , Rfl:  .  omeprazole (PRILOSEC) 20 MG capsule, Take 1 capsule (20 mg total) by mouth daily., Disp: 30 capsule, Rfl: 3 .  ondansetron (ZOFRAN) 4 MG tablet, Take 1 tablet (4 mg total) by mouth every 6 (six) hours. Prn nausea or vomiting, Disp: 12 tablet, Rfl: 0 .  oxybutynin (DITROPAN) 5 MG tablet, Take 1 tablet (5 mg total) by mouth 2 (two) times daily., Disp: 180 tablet, Rfl: 3 .  sulfamethoxazole-trimethoprim (BACTRIM DS,SEPTRA DS) 800-160 MG tablet, Take 1 tablet by mouth 2 (two) times daily., Disp: 14 tablet, Rfl: 0 .  traMADol (ULTRAM) 50 MG tablet, Take 1 tablet (50 mg total) by mouth every 8 (eight) hours as needed., Disp: 15 tablet, Rfl: 0 .  mirabegron ER (MYRBETRIQ) 50 MG TB24 tablet, Take 1 tablet (50 mg total) by mouth daily., Disp: 30 tablet, Rfl: 11 .   ranitidine (ZANTAC) 150 MG tablet, Take 1 tablet (150 mg total) by mouth 2 (two) times daily. (Patient not taking: Reported on 06/25/2016), Disp: 60 tablet, Rfl: 1  PAST MEDICAL HISTORY: Past Medical History:  Diagnosis Date  . Depression    no meds currently  . MS (multiple sclerosis) (HCC)   . SVD (spontaneous vaginal delivery)    x 5    PAST SURGICAL HISTORY: Past Surgical History:  Procedure Laterality Date  . LAPAROSCOPIC TUBAL LIGATION Bilateral 01/06/2013   Procedure: LAPAROSCOPIC TUBAL LIGATION;  Surgeon: Kathreen Cosier, MD;  Location: [facility];  Service: Gynecology;  Laterality: Bilateral;  . right hand surgery      FAMILY HISTORY: Family History  Problem Relation Age of Onset  . Diabetes Mother   . Heart disease Mother   . Diabetes Father   . Multiple sclerosis Cousin     SOCIAL HISTORY:  Social History   Social History  . Marital status: Married    Spouse name: N/A  . Number of children: N/A  . Years of education: N/A   Occupational History  . Not on file.   Social History Main Topics  . Smoking status: Former Smoker    Packs/day: 0.10    Types: Cigarettes  . Smokeless tobacco: Never Used  . Alcohol use No  . Drug use: No  . Sexual activity: Yes    Birth control/ protection: None   Other Topics Concern  . Not on file   Social History Narrative  . No narrative on file     PHYSICAL EXAM  Vitals:   03/12/17 1141  BP: (!) 104/58  Pulse: 76  Resp: 16  Weight: 215 lb (97.5 kg)  Height: 5\' 4"  (1.626 m)  Body mass index is 36.9 kg/m.   General: The patient is well-developed and well-nourished and in no acute distress.   Skin:   There is no rash. There is no pedal edema.  Neck:   She is tender over the splenius capitis muscles and the occipital nerves bilaterally.    Neurologic Exam  Mental status: The patient is alert and oriented x 3 at the time of the examination. The patient has apparent normal recent and remote memory, with  an apparently normal attention span and concentration ability.   Speech is normal.  Cranial nerves: Extraocular movements are full.  Facial strength and sensation is normal. Trapezius and sternocleidomastoid strength is normal. No dysarthria is noted.  The tongue is midline, and the patient has symmetric elevation of the soft palate. No obvious hearing deficits are noted.  Motor:  Muscle bulk is normal.   Tone is normal. Strength is 5/5 in the arms. Strength is 4+/5 in the left lower leg and 5 minus/5 in the right lower leg.  Sensory: She reported reduced sensation to vibration in the left leg. There is also mild asymmetry to touch, lower on the right.   Coordination: Finger-nose-finger is normal. Heel-to-shin is reduced in the legs, left worse than right.   Gait and station: Station is normal.   Gait is wide and her turns are unstable. There is a mild left foot drop. She is unable to tandem walk. Romberg is negative.   Reflexes: Deep tendon reflexes are symmetric and normal bilaterally.           DIAGNOSTIC DATA (LABS, IMAGING, TESTING) - I reviewed patient records, labs, notes, testing and imaging myself where available.  Lab Results  Component Value Date   WBC 17.2 (H) 03/28/2016   HGB 11.8 03/28/2016   HCT 35.6 03/28/2016   MCV 89 03/28/2016   PLT 331 03/28/2016      Component Value Date/Time   NA 137 02/23/2016 0553   K 3.9 02/23/2016 0553   CL 104 02/23/2016 0553   CO2 23 02/23/2016 0553   GLUCOSE 167 (H) 02/23/2016 0553   BUN 10 02/23/2016 0553   CREATININE 0.69 02/23/2016 0553   CALCIUM 9.5 02/23/2016 0553   PROT 6.5 04/16/2016 1119   ALBUMIN 3.9 04/16/2016 1119   AST 18 04/16/2016 1119   ALT 16 04/16/2016 1119   ALKPHOS 59 04/16/2016 1119   BILITOT <0.2 04/16/2016 1119   GFRNONAA >60 02/23/2016 0553   GFRAA >60 02/23/2016 0553       ASSESSMENT AND PLAN  Multiple sclerosis (HCC)  Numbness  Ataxia  Visual disturbance  Other fatigue  Urinary  frequency  High risk medication use  Neck pain   1.    Continue ocrelizumab. Her next dose will be in January 2019 2.    Bilateral splenius capitus trigger point injection with 80 mg Depo-Medrol in Marcaine using sterile technique.    She tolerated the procedure well and there were no complications.   3.    Flexeril 5 mg nightly for insomnia.   Continue citalopram for mood 4.    Add Myrbetriq to the oxybutynin for the bladder.      Renew Tylenol No. 3 for pain and Tramadol prn   Return in 4 months or sooner if there are new or worsening neurologic symptoms. We discussed symptoms of  possible exacerbations.  40 minutes face-to-face interaction with greater than one half of the time counseling and coordinating care about her MS and other MS related  issues and more recent headache.Pearletha Furl. Epimenio Foot, MD, PhD 03/12/2017, 12:34 PM Certified in Neurology, Clinical Neurophysiology, Sleep Medicine, Pain Medicine and Neuroimaging  [facility] [address], [address] [place], [place] [address] [phone]

## 2017-03-31 DIAGNOSIS — G35 Multiple sclerosis: Secondary | ICD-10-CM | POA: Diagnosis not present

## 2017-03-31 DIAGNOSIS — H538 Other visual disturbances: Secondary | ICD-10-CM | POA: Diagnosis not present

## 2017-05-29 ENCOUNTER — Telehealth: Payer: Self-pay | Admitting: *Deleted

## 2017-05-29 NOTE — Telephone Encounter (Signed)
PA for Tylenol #3 #90/30 completed and faxed to [facility], fax# [phone]. Dx: Left sided sciatica (M54.32), Left hip pain (M25.552), Neck Pain (M54.2)/fim

## 2017-06-11 NOTE — Telephone Encounter (Signed)
Tyl. #3 PA approved for dates 05/29/17 thru 11/25/17.  Ref# for call [id number]

## 2017-06-17 ENCOUNTER — Other Ambulatory Visit: Payer: Self-pay | Admitting: Neurology

## 2017-06-19 ENCOUNTER — Encounter: Payer: Self-pay | Admitting: *Deleted

## 2017-07-08 ENCOUNTER — Other Ambulatory Visit: Payer: Self-pay | Admitting: Neurology

## 2017-07-08 NOTE — Telephone Encounter (Signed)
Pt calling back re: acetaminophen-codeine (TYLENOL #3) 300-30 MG tablet  Pt states the pharmacy [facility] [phone] [place] - [address] AT [address] [phone] (Phone) [phone] (Fax)   Is telling her that they are in need of a prescription.  Pt states she is very much in need of this medication.  Please call

## 2017-07-08 NOTE — Telephone Encounter (Signed)
Spoke with Alicia Barker this morning.  Tyl. #3 was r/f 06/19/17 and is available to be picked up in the office.  She verbalized understanding of same/fim

## 2017-07-14 ENCOUNTER — Ambulatory Visit: Payer: Self-pay | Admitting: Family Medicine

## 2017-07-14 ENCOUNTER — Ambulatory Visit: Payer: Medicaid Other | Admitting: Neurology

## 2017-07-14 ENCOUNTER — Encounter: Payer: Self-pay | Admitting: Neurology

## 2017-07-14 ENCOUNTER — Telehealth: Payer: Self-pay | Admitting: Neurology

## 2017-07-14 ENCOUNTER — Other Ambulatory Visit: Payer: Self-pay

## 2017-07-14 VITALS — BP 110/67 | HR 88 | Resp 16 | Ht 64.0 in | Wt 203.0 lb

## 2017-07-14 DIAGNOSIS — F329 Major depressive disorder, single episode, unspecified: Secondary | ICD-10-CM | POA: Diagnosis not present

## 2017-07-14 DIAGNOSIS — G3281 Cerebellar ataxia in diseases classified elsewhere: Secondary | ICD-10-CM

## 2017-07-14 DIAGNOSIS — R350 Frequency of micturition: Secondary | ICD-10-CM | POA: Diagnosis not present

## 2017-07-14 DIAGNOSIS — Z79899 Other long term (current) drug therapy: Secondary | ICD-10-CM

## 2017-07-14 DIAGNOSIS — R200 Anesthesia of skin: Secondary | ICD-10-CM

## 2017-07-14 DIAGNOSIS — F32A Depression, unspecified: Secondary | ICD-10-CM

## 2017-07-14 DIAGNOSIS — G35 Multiple sclerosis: Secondary | ICD-10-CM

## 2017-07-14 DIAGNOSIS — R270 Ataxia, unspecified: Secondary | ICD-10-CM | POA: Diagnosis not present

## 2017-07-14 MED ORDER — MIRABEGRON ER 50 MG PO TB24
50.0000 mg | ORAL_TABLET | Freq: Every day | ORAL | 11 refills | Status: DC
Start: 2017-07-14 — End: 2018-03-28

## 2017-07-14 MED ORDER — TRAMADOL HCL 50 MG PO TABS: 50.0000 mg | ORAL_TABLET | Freq: Three times a day (TID) | ORAL | 1 refills | Status: DC | PRN

## 2017-07-14 MED ORDER — OXYBUTYNIN CHLORIDE 5 MG PO TABS: 5.0000 mg | ORAL_TABLET | Freq: Two times a day (BID) | ORAL | 3 refills | Status: DC

## 2017-07-14 MED ORDER — ONDANSETRON HCL 4 MG PO TABS: 4.0000 mg | ORAL_TABLET | Freq: Four times a day (QID) | ORAL | 3 refills | Status: DC

## 2017-07-14 NOTE — Telephone Encounter (Signed)
Noted/fim 

## 2017-07-14 NOTE — Progress Notes (Signed)
[facility]  PATIENT: Alicia Barker DOB: September 09, 1985  REFERRING DOCTOR OR PCP:  PCP is Francoise Ceo SOURCE: patient, records from hospital, MRI / lab reports, MRI images on PACS  _________________________________   HISTORICAL  CHIEF COMPLAINT:  Chief Complaint  Patient presents with  . Multiple Sclerosis    Last Ocrevus infusion was 03/10/17.  Next infusion scheduled for 09/11/17.  Sts. she occasionally still tripping due to ? right foot drop, but feels she is doing well enough to try working again (at [employer]      HISTORY OF PRESENT ILLNESS:  Alicia Barker is a 31 year old woman who was diagnosed with MS in March 2017.   She feels the MS is mostly stabel but she has a HA x 2 days.    Update 07/14/2017: She had her last ocrelizumab infusion at the end of July 2018. The next infusion is scheduled for 09/11/2017. She has tolerated the infusions well and has not had any definite exacerbations. Since her large exacerbation in 2017, she continues to have weakness in her legs.   Her right leg feels weaker than her left leg.   She sometimes notes a right foot drop.    She walks without a cane.    She is able to do some part time work.  She notes some right leg numbness and tingling.   She has some urinary incontinenc associated with urge.     Oxybutynin has helped a lot.     Mood is better recently but some days she feels hopeless.    She is still on an antidepressant and feels it helps.      From 03/12/2017 MS:  She switched from Tecfidera to Ocrevus in December 2017/January 2018 and just had her second infusion.    She has no new exacerbation.  She was started on Tecfidera April 2017.    She was tolerating Tecfidera well but in August had more difficulty with balance and slurred speech.    MRI 03/25/2016 showed several new enhancing lesions c/w active demyelination   She received IV steroids and started Ocrevus, first dose in December.  .      Gait/strength/sensation:   Since the large exacerbation in 2017, She reports reports left > right leg weakness and clumsiness.   Gait is unsteady.   She has fallen a few times.  She uses a cane sometimes but no longer needs a walker. The right leg was weaker before the large exacerbation.   No hand numbness.    The left leg has painful dysesthesias with a stabbing quality.     Gabapentin had not helped in the past    Bladder/bowel: She has more urinary frequency and urgency with occasional urinary incontinence. Oxybutynin has not helped much.   She has rare bowel incontinence.     Vision/vertigo: She notes some visual blurring since last year.   She has some diplopia but this is better.  Fatigue/sleep: She notes a lot of fatigue, physical > mental.   Sleep is worse with more trouble staying asleep.   Her insomnia improved with nighttime cyclobenzaprine earlier this year but now she has more again.  Mood/cognition: She is having some depression and anxiety that worsened last year and has persisted despite escitalopram.     She gets irritable easily and has a short fuse.     She also reports apathy and poor sleep.  Cognitive processing speed appears to be slower.  HA:   She has  pain in the temples and the neck since Monday.   Pain worsens when she stands or moves and is best when she is still.   She denies N/V but has photophobia and phonophobia.   This is her worst HA in years but similar to ones she had many years ago.   Tylenol #3 and Ibuprofen have not helped.     MS History:   In early March, 2017, she had the onset of slurred speech and leg weakness and ataxia.    A head CT was reportedly normal.    Over the next couple weeks, she had right worse than left visual acuity issues, more numbness and more clumsiness.        Her gait became unsteady.     She also noted mid back pain.    She went to the ER and was diagnosed with an inner ear problem.   She went back to ER 11/15/15 and was found to have an  abnormal MRI consistent with MS.    MRI of the spine also showed additional plaques.     She received 5 days of IV Steroids and noticed an improvement with improved gait but continued numbness.    I have reviewed the MRIs of the brain and spine performed for 12/30/2015 and 11/17/2015. The MRI of the brain with and without contrast shows multiple T2/FLAIR hyperintense foci, many in the periventricular white matter.    Another focus is the pons (with mild enhancement, better seen on c-spine MRI).Marland Kitchen 7 foci enhanced after gadolinium administration imply more recent MS plaques.    There are at least 2 small T2 hyperintense foci within the cervical spine, one of which appears to enhance slightly.    Another enhancing focus is noted within the thoracic spine adjacent to T3-T4.   She started Tecfidera after I first saw her 11/28/2015.   She had a significant relapse and was switched over to ocrelizumab if her first dose around January or February 2018.                                                                                       REVIEW OF SYSTEMS: Constitutional: No fevers, chills, sweats, or change in appetite.  She reports fatigue and poor sleep  Eyes: mild right visual changes, double vision, eye pain Ear, nose and throat: No hearing loss, ear pain, nasal congestion, sore throat Cardiovascular: No chest pain, palpitations Respiratory: No shortness of breath at rest or with exertion.   No wheezes GastrointestinaI: No nausea, vomiting, diarrhea, abdominal pain, fecal incontinence Genitourinary: No dysuria, urinary retention.   She has some urgency and frequency..  She has some nocturia. Musculoskeletal: Reports neck pain, back pain Integumentary: No rash, pruritus, skin lesions Neurological: as above Psychiatric: Notes depression > anxiety.    Endocrine: No palpitations, diaphoresis, change in appetite, change in weigh or increased thirst Hematologic/Lymphatic: No anemia, purpura,  petechiae. Allergic/Immunologic: No itchy/runny eyes, nasal congestion, recent allergic reactions, rashes  ALLERGIES: Allergies  Allergen Reactions  . Penicillins Anaphylaxis, Shortness Of Breath and Swelling    Has patient had a PCN reaction causing immediate rash, facial/tongue/throat swelling, SOB or lightheadedness  with hypotension: YES Has patient had a PCN reaction causing severe rash involving mucus membranes or skin necrosis: NO Has patient had a PCN reaction that required hospitalization NO Has patient had a PCN reaction occurring within the last 10 years: NO If all of the above answers are "NO", then may proceed with Cephalosporin use.    HOME MEDICATIONS:  Current Outpatient Medications:  .  acetaminophen-codeine (TYLENOL #3) 300-30 MG tablet, TAKE 1 TABLET BY MOUTH EVERY 8 HOURS AS NEEDED FOR MODERATE PAIN, Disp: 90 tablet, Rfl: 0 .  meclizine (ANTIVERT) 25 MG tablet, Take 1 tablet (25 mg total) by mouth 3 (three) times daily as needed for dizziness., Disp: 30 tablet, Rfl: 0 .  mirabegron ER (MYRBETRIQ) 50 MG TB24 tablet, Take 1 tablet (50 mg total) by mouth daily., Disp: 30 tablet, Rfl: 11 .  ocrelizumab 600 mg in sodium chloride 0.9 % 500 mL, Inject 600 mg into the vein once., Disp: , Rfl:  .  omeprazole (PRILOSEC) 20 MG capsule, Take 1 capsule (20 mg total) by mouth daily., Disp: 30 capsule, Rfl: 3 .  ondansetron (ZOFRAN) 4 MG tablet, Take 1 tablet (4 mg total) by mouth every 6 (six) hours. Prn nausea or vomiting, Disp: 12 tablet, Rfl: 3 .  traMADol (ULTRAM) 50 MG tablet, Take 1 tablet (50 mg total) by mouth every 8 (eight) hours as needed., Disp: 30 tablet, Rfl: 1 .  cholecalciferol (VITAMIN D) 1000 units tablet, Take 5,000 Units by mouth daily., Disp: , Rfl:  .  citalopram (CELEXA) 40 MG tablet, Take 1 tablet (40 mg total) by mouth daily., Disp: 90 tablet, Rfl: 3 .  cyclobenzaprine (FLEXERIL) 5 MG tablet, Take 1 tablet (5 mg total) by mouth at bedtime. Take up to 3 times a  day, Disp: 90 tablet, Rfl: 4 .  escitalopram (LEXAPRO) 20 MG tablet, Take 1 tablet (20 mg total) by mouth daily., Disp: 30 tablet, Rfl: 11 .  meloxicam (MOBIC) 15 MG tablet, Take 1 tablet (15 mg total) by mouth daily., Disp: 30 tablet, Rfl: 11 .  methylPREDNISolone (MEDROL) 4 MG tablet, Take 6 pills po x 1 d, then 5 po x 1 day, then 4 po x 1d, then 3 po x 1d, then 2 po x 1d, then 1 po x 1d (Patient not taking: Reported on 07/14/2017), Disp: 21 tablet, Rfl: 0 .  miconazole (MICOTIN) 100 MG vaginal suppository, Place 1 suppository (100 mg total) vaginally at bedtime., Disp: 7 suppository, Rfl: 0 .  oxybutynin (DITROPAN) 5 MG tablet, Take 1 tablet (5 mg total) by mouth 2 (two) times daily., Disp: 180 tablet, Rfl: 3 .  ranitidine (ZANTAC) 150 MG tablet, Take 1 tablet (150 mg total) by mouth 2 (two) times daily. (Patient not taking: Reported on 06/25/2016), Disp: 60 tablet, Rfl: 1 .  sulfamethoxazole-trimethoprim (BACTRIM DS,SEPTRA DS) 800-160 MG tablet, Take 1 tablet by mouth 2 (two) times daily. (Patient not taking: Reported on 07/14/2017), Disp: 14 tablet, Rfl: 0  PAST MEDICAL HISTORY: Past Medical History:  Diagnosis Date  . Depression    no meds currently  . MS (multiple sclerosis) (HCC)   . SVD (spontaneous vaginal delivery)    x 5    PAST SURGICAL HISTORY: Past Surgical History:  Procedure Laterality Date  . LAPAROSCOPIC TUBAL LIGATION Bilateral 01/06/2013   Procedure: LAPAROSCOPIC TUBAL LIGATION;  Surgeon: Kathreen CosierBernard A Marshall, MD;  Location: [facility];  Service: Gynecology;  Laterality: Bilateral;  . right hand surgery      FAMILY HISTORY: Family  History  Problem Relation Age of Onset  . Diabetes Mother   . Heart disease Mother   . Diabetes Father   . Multiple sclerosis Cousin     SOCIAL HISTORY:  Social History   Socioeconomic History  . Marital status: Married    Spouse name: Not on file  . Number of children: Not on file  . Years of education: Not on file  . Highest  education level: Not on file  Social Needs  . Financial resource strain: Not on file  . Food insecurity - worry: Not on file  . Food insecurity - inability: Not on file  . Transportation needs - medical: Not on file  . Transportation needs - non-medical: Not on file  Occupational History  . Not on file  Tobacco Use  . Smoking status: Former Smoker    Packs/day: 0.10    Types: Cigarettes  . Smokeless tobacco: Never Used  Substance and Sexual Activity  . Alcohol use: No    Alcohol/week: 0.0 oz  . Drug use: No  . Sexual activity: Yes    Birth control/protection: None  Other Topics Concern  . Not on file  Social History Narrative  . Not on file     PHYSICAL EXAM  Vitals:   07/14/17 1152  BP: 110/67  Pulse: 88  Resp: 16  Weight: 203 lb (92.1 kg)  Height: 5\' 4"  (1.626 m)    Body mass index is 34.84 kg/m.   General: The patient is well-developed and well-nourished and in no acute distress.   Skin:   There is no rash. There is no pedal edema.  Neck:   She is tender over the splenius capitis muscles and the occipital nerves bilaterally.    Neurologic Exam  Mental status: The patient is alert and oriented x 3 at the time of the examination. The patient has apparent normal recent and remote memory, with an apparently normal attention span and concentration ability.   Speech is normal.  Cranial nerves: Extraocular movements are full.  Facial strength and sensation is normal. Trapezius and sternocleidomastoid strength is normal. No dysarthria is noted.  The tongue is midline, and the patient has symmetric elevation of the soft palate. No obvious hearing deficits are noted.  Motor:  Muscle bulk is normal.   Muscle tone is normal. The strength is 5/5 in the arms and the right leg and 4+/5 in the left leg.   Sensory: She reported reduced sensation to vibration in the left leg. There is also mild asymmetry to touch, lower on the right.   Coordination: Finger-nose-finger is  normal. Heel-to-shin is mildly off in the legs, worse on the left.   Gait and station: Station is normal.   Gait is wide and her turns are unstable. There is a mild left foot drop. She is unable to tandem walk. Romberg is negative.   Reflexes: Deep tendon reflexes are symmetric and normal bilaterally.           DIAGNOSTIC DATA (LABS, IMAGING, TESTING) - I reviewed patient records, labs, notes, testing and imaging myself where available.  Lab Results  Component Value Date   WBC 17.2 (H) 03/28/2016   HGB 11.8 03/28/2016   HCT 35.6 03/28/2016   MCV 89 03/28/2016   PLT 331 03/28/2016      Component Value Date/Time   NA 137 02/23/2016 0553   K 3.9 02/23/2016 0553   CL 104 02/23/2016 0553   CO2 23 02/23/2016 0553   GLUCOSE 167 (  H) 02/23/2016 0553   BUN 10 02/23/2016 0553   CREATININE 0.69 02/23/2016 0553   CALCIUM 9.5 02/23/2016 0553   PROT 6.5 04/16/2016 1119   ALBUMIN 3.9 04/16/2016 1119   AST 18 04/16/2016 1119   ALT 16 04/16/2016 1119   ALKPHOS 59 04/16/2016 1119   BILITOT <0.2 04/16/2016 1119   GFRNONAA >60 02/23/2016 0553   GFRAA >60 02/23/2016 0553       ASSESSMENT AND PLAN  Multiple sclerosis (HCC) - Plan: Ambulatory referral to Psychology, CBC with Differential/Platelet, IgG, IgA, IgM, MR BRAIN W WO CONTRAST, MR CERVICAL SPINE W WO CONTRAST  Ataxia  Numbness  Urinary frequency  High risk medication use - Plan: CBC with Differential/Platelet, IgG, IgA, IgM  Depression, unspecified depression type - Plan: Ambulatory referral to Psychology  Cerebellar ataxia in diseases classified elsewhere [facility]) - Plan: MR CERVICAL SPINE W WO CONTRAST   1.    Continue ocrelizumab. Her next dose will be in January 2019.  MRI of the brain and cervical spine to determine if there is any subclinical progression. If present, we will need to consider a different disease modifying therapy. 2.    Ambulatory referral to psychology/counseling for her depression.  3.    Renew  med's 4.    Return in 6 months or sooner if there are new or worsening neurologic symptoms. We discussed symptoms of  possible exacerbations.   Mykal Kirchman A. Epimenio Foot, MD, PhD 07/14/2017, 2:24 PM Certified in Neurology, Clinical Neurophysiology, Sleep Medicine, Pain Medicine and Neuroimaging  [facility] [address] [place], [place] [address] [phone]

## 2017-07-14 NOTE — Telephone Encounter (Signed)
Pt. States they will return to get blood work done.

## 2017-07-15 ENCOUNTER — Other Ambulatory Visit (INDEPENDENT_AMBULATORY_CARE_PROVIDER_SITE_OTHER): Payer: Self-pay

## 2017-07-15 DIAGNOSIS — Z0289 Encounter for other administrative examinations: Secondary | ICD-10-CM

## 2017-07-16 ENCOUNTER — Telehealth: Payer: Self-pay | Admitting: *Deleted

## 2017-07-16 LAB — CBC WITH DIFFERENTIAL/PLATELET
BASOS ABS: 0 10*3/uL (ref 0.0–0.2)
BASOS: 0 %
EOS (ABSOLUTE): 0.3 10*3/uL (ref 0.0–0.4)
Eos: 7 %
Hematocrit: 33.5 % — ABNORMAL LOW (ref 34.0–46.6)
Hemoglobin: 10.8 g/dL — ABNORMAL LOW (ref 11.1–15.9)
IMMATURE GRANULOCYTES: 0 %
Immature Grans (Abs): 0 10*3/uL (ref 0.0–0.1)
Lymphocytes Absolute: 1.2 10*3/uL (ref 0.7–3.1)
Lymphs: 25 %
MCH: 27.7 pg (ref 26.6–33.0)
MCHC: 32.2 g/dL (ref 31.5–35.7)
MCV: 86 fL (ref 79–97)
MONOS ABS: 0.4 10*3/uL (ref 0.1–0.9)
Monocytes: 9 %
NEUTROS ABS: 2.8 10*3/uL (ref 1.4–7.0)
NEUTROS PCT: 59 %
PLATELETS: 281 10*3/uL (ref 150–379)
RBC: 3.9 x10E6/uL (ref 3.77–5.28)
RDW: 16.1 % — AB (ref 12.3–15.4)
WBC: 4.7 10*3/uL (ref 3.4–10.8)

## 2017-07-16 LAB — IGG, IGA, IGM
IgA/Immunoglobulin A, Serum: 174 mg/dL (ref 87–352)
IgG (Immunoglobin G), Serum: 1140 mg/dL (ref 700–1600)
IgM (Immunoglobulin M), Srm: 68 mg/dL (ref 26–217)

## 2017-07-16 NOTE — Telephone Encounter (Signed)
-----   Message from Asa Lente, MD sent at 07/16/2017  3:16 PM EST ----- Please let the patient know that the lab work is fine.

## 2017-07-16 NOTE — Telephone Encounter (Signed)
I have spoken with Alicia Barker this afternoon and explained lab work done in our office is fine.  She verbalized understanding of same/fim

## 2017-07-18 ENCOUNTER — Ambulatory Visit: Payer: Self-pay | Admitting: Family Medicine

## 2017-07-23 ENCOUNTER — Telehealth: Payer: Self-pay | Admitting: *Deleted

## 2017-07-23 NOTE — Telephone Encounter (Signed)
PA for Tramadol 50mg  #30/30 completed and faxed to [facility], fax# [phone]/fim

## 2017-07-25 ENCOUNTER — Ambulatory Visit: Payer: Medicaid Other | Admitting: Family Medicine

## 2017-07-25 ENCOUNTER — Other Ambulatory Visit: Payer: Self-pay | Admitting: Family Medicine

## 2017-07-25 ENCOUNTER — Encounter: Payer: Self-pay | Admitting: Family Medicine

## 2017-07-25 ENCOUNTER — Other Ambulatory Visit: Payer: Self-pay

## 2017-07-25 VITALS — BP 110/68 | HR 88 | Temp 98.4°F | Ht 64.0 in | Wt 202.0 lb

## 2017-07-25 DIAGNOSIS — M79641 Pain in right hand: Secondary | ICD-10-CM | POA: Diagnosis not present

## 2017-07-25 DIAGNOSIS — N393 Stress incontinence (female) (male): Secondary | ICD-10-CM

## 2017-07-25 DIAGNOSIS — M79642 Pain in left hand: Secondary | ICD-10-CM

## 2017-07-25 DIAGNOSIS — G35 Multiple sclerosis: Secondary | ICD-10-CM

## 2017-07-25 DIAGNOSIS — Z23 Encounter for immunization: Secondary | ICD-10-CM

## 2017-07-25 DIAGNOSIS — N3946 Mixed incontinence: Secondary | ICD-10-CM | POA: Diagnosis not present

## 2017-07-25 LAB — POCT URINALYSIS DIP (MANUAL ENTRY)
Bilirubin, UA: NEGATIVE
Glucose, UA: NEGATIVE mg/dL
Ketones, POC UA: NEGATIVE mg/dL
NITRITE UA: NEGATIVE
PROTEIN UA: NEGATIVE mg/dL
UROBILINOGEN UA: 0.2 U/dL
pH, UA: 6 (ref 5.0–8.0)

## 2017-07-25 LAB — POCT UA - MICROSCOPIC ONLY: Trichomonas, UA: POSITIVE

## 2017-07-25 MED ORDER — METRONIDAZOLE 500 MG PO TABS: 2000.0000 mg | ORAL_TABLET | Freq: Once | ORAL | 0 refills | Status: AC

## 2017-07-25 NOTE — Patient Instructions (Signed)
Alicia Barker, your seen today for hand pain.  You may have some arthritis in her hands I have put in a referral for occupational therapy to help with some range of motion exercises and to help strengthen her hands.  For your urinary incontinence you have a mixed stress and urge incontinence.  The stress (leakage of urine with cough, sneeze) is most likely due to having multiple children and is very common.  The urge incontinence (sudden urge to go) is little unclear to me at this time.  I will be checking her urine to make sure he do not have any urine infection.  I will call you if there are any abnormal results.  You are on the right medication right now therefore stress incontinence which is the oxybutynin.  I would recommend that you continue taking this.  I have also placed a referral to physical therapy they can help strengthen up your pelvic floor muscles and help with this type of incontinence as well.   If you do not hear from the occupational therapist office within a week I would recommend that you call the hand center at [phone].   It was very nice seeing you today I will be feel better.   Alicia Hill L. Myrtie Soman, MD [facility] Family Medicine Resident PGY-2 07/25/2017 10:51 AM

## 2017-07-25 NOTE — Assessment & Plan Note (Signed)
This is a chronic problem for her appears to be stable.  Requesting a walking roller today.  Chart review reveals that she was also seen 7 months ago and had an order placed for rolling walker.  Signed DME order today and recommended that [facility] to see if she can fill this.  Recommended that she follow up and call the office if this does not work out.

## 2017-07-25 NOTE — Progress Notes (Signed)
Subjective:  Alicia Barker is a 31 y.o. female who [facility] today with a chief complaint of hand pain and urinary incontinence  HPI:  Urinary incontinence Has a long history of stress incontinence that has been ongoing since she had her children.  Now reporting new onset urge incontinence symptoms for the past 2 weeks without any dysuria, vaginal discharge or abnormal uterine bleeding.  Denies any abdominal pain, fevers, chills, nausea, vomiting or diarrhea.  Has been taking oxybutynin and reports improvement of her symptoms.  Bilateral hand pain Reporting pain in her bilateral hands and some reported redness and swelling.  Reports that sometimes she will make a fist and her hands feelings are getting stuck.  Denies any fevers, chills or joint pain elsewhere.  Has no known history of rheumatoid arthritis and has no known family history of rheumatoid arthritis.  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  Denies any known trauma.  Does have a history significant for multiple sclerosis and [facility].  PMH: Multiple sclerosis Tobacco use: Smoker Medication: reviewed and updated ROS: see HPI   Objective:  Physical Exam: BP 110/68   Pulse 88   Temp 98.4 F (36.9 C) (Oral)   Ht 5\' 4"  (1.626 m)   Wt 202 lb (91.6 kg)   LMP 07/24/2017 (Exact Date)   SpO2 99%   BMI 34.67 kg/m   Gen: 31 year old female in NAD, resting comfortably CV: RRR with no murmurs appreciated Pulm: NWOB, CTAB with no crackles, wheezes, or rhonchi GI: Normal bowel sounds present. Soft, Nontender, Nondistended. MSK: no edema, cyanosis, or clubbing noted.  Hands with no joint deformity or edema.  No overlying erythema.  Full active and passive range of motion Skin: warm, dry Neuro: grossly normal, moves all extremities.  Bilateral grip strength 4/5.  Psych: Normal affect and thought content  Results for orders placed or performed in visit on 07/25/17  (from the past 72 hour(s))  POCT UA - Microscopic Only     Status: Abnormal   Collection Time: 07/25/17 10:50 AM  Result Value Ref Range   WBC, Ur, HPF, POC 5-10    RBC, urine, microscopic 5-10    Bacteria, U Microscopic FEW    Mucus, UA 3+    Epithelial cells, urine per micros 0-3    Trichomonas, UA Positive   POCT urinalysis dipstick     Status: Abnormal   Collection Time: 07/25/17 11:00 AM  Result Value Ref Range   Color, UA yellow yellow   Clarity, UA clear clear   Glucose, UA negative negative mg/dL   Bilirubin, UA negative negative   Ketones, POC UA negative negative mg/dL   Spec Grav, UA >=1.610>=1.030 (A) 1.010 - 1.025   Blood, UA small (A) negative   pH, UA 6.0 5.0 - 8.0   Protein Ur, POC negative negative mg/dL   Urobilinogen, UA 0.2 0.2 or 1.0 E.U./dL   Nitrite, UA Negative Negative   Leukocytes, UA Small (1+) (A) Negative     Assessment/Plan:  Multiple sclerosis (HCC) This is a chronic problem for her appears to be stable.  Requesting a walking roller today.  Chart review reveals that she was also seen 7 months ago and had an order placed for rolling walker.  Signed DME order today and recommended that she go to [facility] to see if she can fill this.  Recommended that she follow up and call the office if this does not  work out.  Stress incontinence Chronic problem for this patient since she has had children.  Has been taking oxybutynin and reports some improvement of symptoms.  Discussed that strengthening of pelvic muscles is also important.  Placed referral to physical therapy will have her follow-up with Jerene Bears who is known to do good work with pelvic floor physical therapy.  Urge incontinence and trichomoniasis Patient reports urge incontinence for the past 2 weeks and some increased frequency.  Denies any vaginal discharge, abnormal uterine bleeding, abdominal pain, fever, chills, nausea, vomiting or diarrhea.  Ordered UA which was positive for  trichomoniasis.  Will call patient to tell her about infection and placed order for Flagyl.  Will recommend that she come in for STD testing.   Bilateral hand pain With patient has some weakness in her grip strength about 4 out of and some chronic numbness and tingling in her bilateral upper extremities going down to her hands which is most likely normal sequela of her multiple sclerosis.  Given her concern for changes in her range of motion and strength, have placed referral to occupational therapy and will have her follow-up with a hand center.  She has no redness, swelling, warmth or significant tenderness to palpation in the hands or wrists and has full active and passive range of motion.  No red flag signs or symptoms.  Joan Avetisyan L. Myrtie Soman, MD [facility] Family Medicine Resident PGY-2 07/25/2017 12:46 PM

## 2017-07-25 NOTE — Progress Notes (Signed)
Call patient to let her know that she had Trichomonas in her urine.  Unfortunately had to leave message.  Told her that I put in a prescription to the [facility] on file and that that her sexual partners should also be treated and she should be using protection in the interim.   Daniel L. Myrtie SomanWarden, MD [facility] Resident PGY-2 07/25/2017 2:11 PM

## 2017-07-25 NOTE — Assessment & Plan Note (Signed)
Chronic problem for this patient since she has had children.  Has been taking oxybutynin and reports some improvement of symptoms.  Discussed that strengthening of pelvic muscles is also important.  Placed referral to physical therapy will have her follow-up with Jerene Bears who is known to do good work with pelvic floor physical therapy.

## 2017-07-27 LAB — URINE CULTURE: ORGANISM ID, BACTERIA: NO GROWTH

## 2017-07-29 ENCOUNTER — Telehealth: Payer: Self-pay | Admitting: Family Medicine

## 2017-07-29 ENCOUNTER — Other Ambulatory Visit: Payer: Self-pay | Admitting: Family Medicine

## 2017-07-29 MED ORDER — METRONIDAZOLE 500 MG PO TABS: 500.0000 mg | ORAL_TABLET | Freq: Two times a day (BID) | ORAL | 0 refills | Status: AC

## 2017-07-29 NOTE — Telephone Encounter (Signed)
Pt returning Dr Don PerkingWardens phone call. Pt's phone doesn't work and he needs to call pt at [phone]. Please advise

## 2017-07-29 NOTE — Telephone Encounter (Signed)
Called patient. Thanks.

## 2017-07-31 ENCOUNTER — Telehealth: Payer: Self-pay | Admitting: Family Medicine

## 2017-07-31 NOTE — Telephone Encounter (Signed)
The physical therapist the pt was referred to does not except medicaid. Pt needs to be referred to a different PT who does except medicaid. Please advise

## 2017-08-11 ENCOUNTER — Other Ambulatory Visit: Payer: Self-pay | Admitting: Neurology

## 2017-08-13 ENCOUNTER — Telehealth: Payer: Self-pay | Admitting: Neurology

## 2017-08-13 ENCOUNTER — Other Ambulatory Visit: Payer: Self-pay | Admitting: Family Medicine

## 2017-08-13 MED ORDER — FLUCONAZOLE 150 MG PO TABS: 150.0000 mg | ORAL_TABLET | Freq: Once | ORAL | 0 refills | Status: AC

## 2017-08-13 MED ORDER — ACETAMINOPHEN-CODEINE #3 300-30 MG PO TABS: ORAL_TABLET | ORAL | 0 refills | Status: DC

## 2017-08-13 NOTE — Telephone Encounter (Signed)
Order placed. Please call and let patient know and if she develops any abdominal pain, fevers or chills.  Daniel L. Myrtie Soman, MD [facility] Family Medicine Resident PGY-2 08/13/2017 12:22 PM

## 2017-08-13 NOTE — Telephone Encounter (Signed)
Rx. awaiting RAS sig/fim 

## 2017-08-13 NOTE — Telephone Encounter (Signed)
[phone].  Would like medication for yeast infection.  [facility] on [address]

## 2017-08-13 NOTE — Telephone Encounter (Signed)
Pt request refill for acetaminophen-codeine (TYLENOL #3) 300-30 MG tablet

## 2017-08-13 NOTE — Telephone Encounter (Signed)
Rx. up front [facility]/fim 

## 2017-08-15 NOTE — Telephone Encounter (Signed)
Did not see where med was sent in for yeast infection. Lamonte Sakai, April D, [place]

## 2017-08-15 NOTE — Telephone Encounter (Signed)
Please call in diflucan 150mg  PO once.  I will not be able to get to a computer until this afternoon.  Thanks, Dr. Myrtie Soman

## 2017-08-15 NOTE — Telephone Encounter (Signed)
Pharmacy says Rx has never been received.  Please advise

## 2017-08-19 NOTE — Telephone Encounter (Signed)
Called into [facility] pharmacy. 

## 2017-09-04 NOTE — Telephone Encounter (Signed)
PA approved by [employer] on 07/23/17.  RV#[id number].  Valid through 01/19/18.

## 2017-09-23 ENCOUNTER — Telehealth: Payer: Self-pay | Admitting: Neurology

## 2017-09-23 NOTE — Telephone Encounter (Signed)
LMTC. She is getting #90 Tramadol and #90 Tyl. #3 per month.  Per RAS, ok to fill the one that helps the most, but not both./fim

## 2017-09-23 NOTE — Telephone Encounter (Signed)
Pt returning call, wanting to speak with RN about which one helps the most

## 2017-09-23 NOTE — Telephone Encounter (Signed)
Pt is asking if she can get a refill prescription for Tylenol 3, please call and advise

## 2017-09-24 MED ORDER — AMPHETAMINE-DEXTROAMPHETAMINE 10 MG PO TABS: 10.0000 mg | ORAL_TABLET | Freq: Two times a day (BID) | ORAL | 0 refills | Status: DC

## 2017-09-24 MED ORDER — ACETAMINOPHEN-CODEINE #3 300-30 MG PO TABS: ORAL_TABLET | ORAL | 0 refills | Status: DC

## 2017-09-24 NOTE — Telephone Encounter (Signed)
Per RAS, ok for generic Adderall IR 10mg  po bid for decreased focus, attention, fatigue.  Adderall and Tyl. #3 rx's up front [facility].  Tramadol d/c from med list/fim

## 2017-09-24 NOTE — Telephone Encounter (Signed)
LMTC./fim 

## 2017-09-24 NOTE — Telephone Encounter (Signed)
Pt returning call, sating if she doesn't answer to please leave a detailed message since she will be at work.

## 2017-09-24 NOTE — Addendum Note (Signed)
Addended by: Candis Schatz I on: 09/24/2017 03:37 PM   Modules accepted: Orders

## 2017-09-24 NOTE — Telephone Encounter (Signed)
Spoke with [employer]. She feels Tyl. #3 helps most with discomfort, so Tramadol will be d/c and Tyl.#3 r/f.  She also requests med for fatigue.  She has returned to work, and so fatigue has been more of an issue.  Has not tried med for fatigue yet.  Will check with [facility] and have that rx. ready for her to pick up this afternoon as well./fim

## 2017-09-30 ENCOUNTER — Ambulatory Visit: Payer: Medicaid Other | Attending: Family Medicine | Admitting: Physical Therapy

## 2017-10-02 ENCOUNTER — Ambulatory Visit: Payer: Medicaid Other | Admitting: Occupational Therapy

## 2017-11-03 ENCOUNTER — Telehealth: Payer: Self-pay | Admitting: Neurology

## 2017-11-03 MED ORDER — ACETAMINOPHEN-CODEINE #3 300-30 MG PO TABS: ORAL_TABLET | ORAL | 0 refills | Status: DC

## 2017-11-03 NOTE — Telephone Encounter (Signed)
Pt requesting a refill for acetaminophen-codeine (TYLENOL #3) 300-30 MG tablet

## 2017-11-03 NOTE — Telephone Encounter (Signed)
Rx. awaiting RAS sig/fim 

## 2017-11-03 NOTE — Telephone Encounter (Signed)
Rx. up front [facility]/fim 

## 2017-12-03 ENCOUNTER — Other Ambulatory Visit: Payer: Self-pay | Admitting: Neurology

## 2017-12-16 ENCOUNTER — Telehealth: Payer: Self-pay | Admitting: *Deleted

## 2017-12-16 ENCOUNTER — Telehealth: Payer: Self-pay | Admitting: Neurology

## 2017-12-16 NOTE — Telephone Encounter (Signed)
Fax received from [facility] [employer] [facility].  Tyl.#3 PA denied because documentation did not demonstrate pt's need for continuation of therapy with a short acting opioid for chronic pain.  Pt. aware that Medicaid may not cover this in the future for her; if so, will discuss other tx. options at next appt.  She verbalized understanding of same/fim

## 2017-12-16 NOTE — Telephone Encounter (Signed)
Last rx. given for Tyl. #3 was on 12/04/16, #90.  I spoke with the pharmacist at [facility] and was told rx. is ready, for the full #90, waiting for pt. to pick it up.  I spoke with pt. and let her know this./fim

## 2017-12-16 NOTE — Telephone Encounter (Signed)
Patient requesting refill of acetaminophen-codeine (TYLENOL #3) 300-30 MG tablet.

## 2018-01-12 ENCOUNTER — Ambulatory Visit: Payer: Medicaid Other | Admitting: Neurology

## 2018-01-13 ENCOUNTER — Encounter: Payer: Self-pay | Admitting: Neurology

## 2018-01-19 ENCOUNTER — Ambulatory Visit: Payer: Medicaid Other | Admitting: Neurology

## 2018-01-19 ENCOUNTER — Encounter: Payer: Self-pay | Admitting: Neurology

## 2018-01-19 VITALS — BP 118/78 | Ht 64.0 in | Wt 203.0 lb

## 2018-01-19 DIAGNOSIS — R5383 Other fatigue: Secondary | ICD-10-CM | POA: Diagnosis not present

## 2018-01-19 DIAGNOSIS — F329 Major depressive disorder, single episode, unspecified: Secondary | ICD-10-CM

## 2018-01-19 DIAGNOSIS — R300 Dysuria: Secondary | ICD-10-CM | POA: Diagnosis not present

## 2018-01-19 DIAGNOSIS — G35 Multiple sclerosis: Secondary | ICD-10-CM

## 2018-01-19 DIAGNOSIS — Z79899 Other long term (current) drug therapy: Secondary | ICD-10-CM

## 2018-01-19 DIAGNOSIS — R531 Weakness: Secondary | ICD-10-CM | POA: Diagnosis not present

## 2018-01-19 DIAGNOSIS — R200 Anesthesia of skin: Secondary | ICD-10-CM | POA: Diagnosis not present

## 2018-01-19 DIAGNOSIS — R269 Unspecified abnormalities of gait and mobility: Secondary | ICD-10-CM

## 2018-01-19 DIAGNOSIS — F32A Depression, unspecified: Secondary | ICD-10-CM

## 2018-01-19 MED ORDER — ACETAMINOPHEN-CODEINE #3 300-30 MG PO TABS: ORAL_TABLET | ORAL | 0 refills | Status: DC

## 2018-01-19 MED ORDER — AMPHETAMINE-DEXTROAMPHETAMINE 10 MG PO TABS: 10.0000 mg | ORAL_TABLET | Freq: Two times a day (BID) | ORAL | 0 refills | Status: DC

## 2018-01-19 NOTE — Progress Notes (Signed)
[facility]  PATIENT: Alicia Barker DOB: 1986-05-04  REFERRING DOCTOR OR PCP:  PCP is Francoise Ceo SOURCE: patient, records from hospital, MRI / lab reports, MRI images on PACS  _________________________________   HISTORICAL  CHIEF COMPLAINT:  Chief Complaint  Patient presents with  . Multiple Sclerosis    Pt on Ocrevus. Patient reports that she is doing well.     HISTORY OF PRESENT ILLNESS:  Alicia Barker is a 32 year old woman who was diagnosed with MS in March 2017.      Update 01/19/2018:   She feels her MS is stable.  She denies any exacerbations.  She is on Ocrevus.  She tolerates it well.  Her last infusion was April 2019.  At the last visit we checked IgG/IgM and they were normal.  She notes some difficulty with her gait and some mild weakness in her legs.  The weakness is worse on the right than the left.  When she walks a while she will have a right foot drop.  She also notes some right leg numbness and tingling.  She is able to walk without a cane.  Vision is fine.  She notes less urinary urgency incontinence on oxybutynin and Myrbetriq  She has some fatigue.  She sleeps well most nights.  She has mild depression, somewhat helped by Celexa.   She never did counseling.   She has had some attention deficit helped by Adderall (does not take every day).      She has fewer headaches.  When one occurs, she takes Tyl #3  Update 07/14/2017: She had her last ocrelizumab infusion at the end of July 2018. The next infusion is scheduled for 09/11/2017. She has tolerated the infusions well and has not had any definite exacerbations. Since her large exacerbation in 2017, she continues to have weakness in her legs.   Her right leg feels weaker than her left leg.   She sometimes notes a right foot drop.    She walks without a cane.    She is able to do some part time work.  She notes some right leg numbness and tingling.   She has some urinary incontinenc  associated with urge.     Oxybutynin has helped a lot.     Mood is better recently but some days she feels hopeless.    She is still on an antidepressant and feels it helps.      From 03/12/2017 MS:  She switched from Tecfidera to Ocrevus in December 2017/January 2018 and just had her second infusion.    She has no new exacerbation.  She was started on Tecfidera April 2017.    She was tolerating Tecfidera well but in August had more difficulty with balance and slurred speech.    MRI 03/25/2016 showed several new enhancing lesions c/w active demyelination   She received IV steroids and started Ocrevus, first dose in December.  .     Gait/strength/sensation:   Since the large exacerbation in 2017, She reports reports left > right leg weakness and clumsiness.   Gait is unsteady.   She has fallen a few times.  She uses a cane sometimes but no longer needs a walker. The right leg was weaker before the large exacerbation.   No hand numbness.    The left leg has painful dysesthesias with a stabbing quality.     Gabapentin had not helped in the past    Bladder/bowel: She has more urinary frequency and urgency  with occasional urinary incontinence. Oxybutynin has not helped much.   She has rare bowel incontinence.     Vision/vertigo: She notes some visual blurring since last year.   She has some diplopia but this is better.  Fatigue/sleep: She notes a lot of fatigue, physical > mental.   Sleep is worse with more trouble staying asleep.   Her insomnia improved with nighttime cyclobenzaprine earlier this year but now she has more again.  Mood/cognition: She is having some depression and anxiety that worsened last year and has persisted despite escitalopram.     She gets irritable easily and has a short fuse.     She also reports apathy and poor sleep.  Cognitive processing speed appears to be slower.  HA:   She has pain in the temples and the neck since Monday.   Pain worsens when she stands or moves and is best  when she is still.   She denies N/V but has photophobia and phonophobia.   This is her worst HA in years but similar to ones she had many years ago.   Tylenol #3 and Ibuprofen have not helped.     MS History:   In early March, 2017, she had the onset of slurred speech and leg weakness and ataxia.    A head CT was reportedly normal.    Over the next couple weeks, she had right worse than left visual acuity issues, more numbness and more clumsiness.        Her gait became unsteady.     She also noted mid back pain.    She went to the ER and was diagnosed with an inner ear problem.   She went back to ER 11/15/15 and was found to have an abnormal MRI consistent with MS.    MRI of the spine also showed additional plaques.     She received 5 days of IV Steroids and noticed an improvement with improved gait but continued numbness.    I have reviewed the MRIs of the brain and spine performed for 12/30/2015 and 11/17/2015. The MRI of the brain with and without contrast shows multiple T2/FLAIR hyperintense foci, many in the periventricular white matter.    Another focus is the pons (with mild enhancement, better seen on c-spine MRI).Marland Kitchen 7 foci enhanced after gadolinium administration imply more recent MS plaques.    There are at least 2 small T2 hyperintense foci within the cervical spine, one of which appears to enhance slightly.    Another enhancing focus is noted within the thoracic spine adjacent to T3-T4.   She started Tecfidera after I first saw her 11/28/2015.   She had a significant relapse and was switched over to ocrelizumab if her first dose around January or February 2018.                                                                                       REVIEW OF SYSTEMS: Constitutional: No fevers, chills, sweats, or change in appetite.  She reports fatigue and poor sleep  Eyes: mild right visual changes, double vision, eye pain Ear, nose and throat: No hearing loss, ear pain, nasal  congestion, sore  throat Cardiovascular: No chest pain, palpitations Respiratory: No shortness of breath at rest or with exertion.   No wheezes GastrointestinaI: No nausea, vomiting, diarrhea, abdominal pain, fecal incontinence Genitourinary: No dysuria, urinary retention.   She has some urgency and frequency..  She has some nocturia. Musculoskeletal: Reports neck pain, back pain Integumentary: No rash, pruritus, skin lesions Neurological: as above Psychiatric: Notes depression > anxiety.    Endocrine: No palpitations, diaphoresis, change in appetite, change in weigh or increased thirst Hematologic/Lymphatic: No anemia, purpura, petechiae. Allergic/Immunologic: No itchy/runny eyes, nasal congestion, recent allergic reactions, rashes  ALLERGIES: Allergies  Allergen Reactions  . Penicillins Anaphylaxis, Shortness Of Breath and Swelling    Has patient had a PCN reaction causing immediate rash, facial/tongue/throat swelling, SOB or lightheadedness with hypotension: YES Has patient had a PCN reaction causing severe rash involving mucus membranes or skin necrosis: NO Has patient had a PCN reaction that required hospitalization NO Has patient had a PCN reaction occurring within the last 10 years: NO If all of the above answers are "NO", then may proceed with Cephalosporin use.    HOME MEDICATIONS:  Current Outpatient Medications:  .  acetaminophen-codeine (TYLENOL #3) 300-30 MG tablet, One po tid, Disp: 90 tablet, Rfl: 0 .  amphetamine-dextroamphetamine (ADDERALL) 10 MG tablet, Take 1 tablet (10 mg total) by mouth 2 (two) times daily., Disp: 60 tablet, Rfl: 0 .  cholecalciferol (VITAMIN D) 1000 units tablet, Take 5,000 Units by mouth daily., Disp: , Rfl:  .  citalopram (CELEXA) 40 MG tablet, Take 1 tablet (40 mg total) by mouth daily., Disp: 90 tablet, Rfl: 3 .  cyclobenzaprine (FLEXERIL) 5 MG tablet, Take 1 tablet (5 mg total) by mouth at bedtime. Take up to 3 times a day, Disp: 90 tablet, Rfl: 4 .   meclizine (ANTIVERT) 25 MG tablet, Take 1 tablet (25 mg total) by mouth 3 (three) times daily as needed for dizziness., Disp: 30 tablet, Rfl: 0 .  meloxicam (MOBIC) 15 MG tablet, Take 1 tablet (15 mg total) by mouth daily., Disp: 30 tablet, Rfl: 11 .  methylPREDNISolone (MEDROL) 4 MG tablet, Take 6 pills po x 1 d, then 5 po x 1 day, then 4 po x 1d, then 3 po x 1d, then 2 po x 1d, then 1 po x 1d, Disp: 21 tablet, Rfl: 0 .  miconazole (MICOTIN) 100 MG vaginal suppository, Place 1 suppository (100 mg total) vaginally at bedtime., Disp: 7 suppository, Rfl: 0 .  mirabegron ER (MYRBETRIQ) 50 MG TB24 tablet, Take 1 tablet (50 mg total) by mouth daily., Disp: 30 tablet, Rfl: 11 .  ocrelizumab 600 mg in sodium chloride 0.9 % 500 mL, Inject 600 mg into the vein once., Disp: , Rfl:  .  omeprazole (PRILOSEC) 20 MG capsule, Take 1 capsule (20 mg total) by mouth daily., Disp: 30 capsule, Rfl: 3 .  ondansetron (ZOFRAN) 4 MG tablet, Take 1 tablet (4 mg total) by mouth every 6 (six) hours. Prn nausea or vomiting, Disp: 12 tablet, Rfl: 3 .  oxybutynin (DITROPAN) 5 MG tablet, Take 1 tablet (5 mg total) by mouth 2 (two) times daily., Disp: 180 tablet, Rfl: 3 .  sulfamethoxazole-trimethoprim (BACTRIM DS,SEPTRA DS) 800-160 MG tablet, Take 1 tablet by mouth 2 (two) times daily., Disp: 14 tablet, Rfl: 0 .  ranitidine (ZANTAC) 150 MG tablet, Take 1 tablet (150 mg total) by mouth 2 (two) times daily. (Patient not taking: Reported on 06/25/2016), Disp: 60 tablet, Rfl: 1  PAST MEDICAL HISTORY:  Past Medical History:  Diagnosis Date  . Depression    no meds currently  . MS (multiple sclerosis) (HCC)   . SVD (spontaneous vaginal delivery)    x 5    PAST SURGICAL HISTORY: Past Surgical History:  Procedure Laterality Date  . LAPAROSCOPIC TUBAL LIGATION Bilateral 01/06/2013   Procedure: LAPAROSCOPIC TUBAL LIGATION;  Surgeon: Kathreen Cosier, MD;  Location: [facility];  Service: Gynecology;  Laterality: Bilateral;  . right  hand surgery      FAMILY HISTORY: Family History  Problem Relation Age of Onset  . Diabetes Mother   . Heart disease Mother   . Diabetes Father   . Multiple sclerosis Cousin     SOCIAL HISTORY:  Social History   Socioeconomic History  . Marital status: Married    Spouse name: Not on file  . Number of children: Not on file  . Years of education: Not on file  . Highest education level: Not on file  Occupational History  . Not on file  Social Needs  . Financial resource strain: Not on file  . Food insecurity:    Worry: Not on file    Inability: Not on file  . Transportation needs:    Medical: Not on file    Non-medical: Not on file  Tobacco Use  . Smoking status: Former Smoker    Packs/day: 0.10    Types: Cigarettes  . Smokeless tobacco: Never Used  Substance and Sexual Activity  . Alcohol use: No    Alcohol/week: 0.0 oz  . Drug use: No  . Sexual activity: Yes    Birth control/protection: None  Lifestyle  . Physical activity:    Days per week: Not on file    Minutes per session: Not on file  . Stress: Not on file  Relationships  . Social connections:    Talks on phone: Not on file    Gets together: Not on file    Attends religious service: Not on file    Active member of club or organization: Not on file    Attends meetings of clubs or organizations: Not on file    Relationship status: Not on file  . Intimate partner violence:    Fear of current or ex partner: Not on file    Emotionally abused: Not on file    Physically abused: Not on file    Forced sexual activity: Not on file  Other Topics Concern  . Not on file  Social History Narrative  . Not on file     PHYSICAL EXAM  Vitals:   01/19/18 1312  BP: 118/78  Weight: 203 lb (92.1 kg)  Height: 5\' 4"  (1.626 m)    Body mass index is 34.84 kg/m.   General: The patient is well-developed and well-nourished and in no acute distress.   She has no rashes.    Neurologic Exam  Mental status: The  patient is alert and oriented x 3 at the time of the examination. The patient has apparent normal recent and remote memory, with an apparently normal attention span and concentration ability.   Speech is normal.  Cranial nerves: Extraocular movements are full.  Facial strength and sensation is normal. Trapezius and sternocleidomastoid strength is normal. No dysarthria is noted.  The tongue is midline, and the patient has symmetric elevation of the soft palate. No obvious hearing deficits are noted.  Motor:  Muscle bulk is normal.   Muscle tone is normal.  Strength was 5/5 in the arms  and the right leg in the proximal left leg but 4+/5 in the left ankle and toe extensors  Sensory:   Vibration sensation and touch sensation is normal and symmetric in the legs. Coordination: Finger-nose-finger is normal. Heel-to-shin is mildly off in the legs, worse on the left.   Gait and station: Station is normal.  Gait has improved and is near normal with just a minimal foot drop.  Her tandem is wide.  Romberg is negative.   Reflexes: Deep tendon reflexes are symmetric and normal bilaterally.           DIAGNOSTIC DATA (LABS, IMAGING, TESTING) - I reviewed patient records, labs, notes, testing and imaging myself where available.  Lab Results  Component Value Date   WBC 4.7 07/15/2017   HGB 10.8 (L) 07/15/2017   HCT 33.5 (L) 07/15/2017   MCV 86 07/15/2017   PLT 281 07/15/2017      Component Value Date/Time   NA 137 02/23/2016 0553   K 3.9 02/23/2016 0553   CL 104 02/23/2016 0553   CO2 23 02/23/2016 0553   GLUCOSE 167 (H) 02/23/2016 0553   BUN 10 02/23/2016 0553   CREATININE 0.69 02/23/2016 0553   CALCIUM 9.5 02/23/2016 0553   PROT 6.5 04/16/2016 1119   ALBUMIN 3.9 04/16/2016 1119   AST 18 04/16/2016 1119   ALT 16 04/16/2016 1119   ALKPHOS 59 04/16/2016 1119   BILITOT <0.2 04/16/2016 1119   GFRNONAA >60 02/23/2016 0553   GFRAA >60 02/23/2016 0553       ASSESSMENT AND PLAN  Multiple  sclerosis (HCC)  Numbness  Other fatigue  High risk medication use  Weakness  Depression, unspecified depression type  Dysuria  Gait disturbance   1.    She will continue ocrelizumab.   2.    Renew Adderall and Tylenol 3. 3.    Continue Celexa for mood and oxybutynin and Myrbetriq for bladder 4.    Return in 6 months or sooner if there are new or worsening neurologic symptoms. We discussed symptoms of  possible exacerbations.   Tannon Peerson A. Epimenio Foot, MD, PhD 01/19/2018, 2:08 PM Certified in Neurology, Clinical Neurophysiology, Sleep Medicine, Pain Medicine and Neuroimaging  [facility] [address], [address] [place], [place] [address] [phone]

## 2018-01-20 ENCOUNTER — Telehealth: Payer: Self-pay | Admitting: Neurology

## 2018-01-20 NOTE — Telephone Encounter (Signed)
LMOM that Tyl. #3, 90 tablets is $10.31 at [facility], with coupon from [email or web address].  If she  has other questions, please call back/fim

## 2018-01-20 NOTE — Telephone Encounter (Signed)
Pt called stating that the discount card Dr. Epimenio Foot gave her to lower the cost of acetaminophen-codeine (TYLENOL #3) 300-30 MG tablet only lowered it $3. Pt requesting a call back stating this medication is still too pricey.

## 2018-02-16 ENCOUNTER — Telehealth: Payer: Self-pay | Admitting: Neurology

## 2018-02-16 NOTE — Telephone Encounter (Signed)
Spoke to patient - she woke up this morning and noticed swelling in her left hand.  It has progressively become worse over the last several hours and has started to itch.  I instructed her to remove any jewelry on that hand and to contact her PCP for further evaluation.  She is agreeable to this plan and will contact PCP today.

## 2018-02-16 NOTE — Telephone Encounter (Signed)
Pt has called, asking RN Faith to call her.  Pt states she has swelling on her back and her left hand.  Pt stated it started itching then getting tight feeling.  Please call

## 2018-02-18 ENCOUNTER — Ambulatory Visit: Payer: Medicaid Other | Admitting: Neurology

## 2018-02-25 ENCOUNTER — Other Ambulatory Visit: Payer: Self-pay | Admitting: Neurology

## 2018-02-26 NOTE — Telephone Encounter (Signed)
Rx registry checked. Last fill date is 01/19/18 for #90. Last OV was 01/19/18 and next OV is 07/22/18.

## 2018-03-19 ENCOUNTER — Other Ambulatory Visit: Payer: Self-pay | Admitting: Neurology

## 2018-03-20 ENCOUNTER — Other Ambulatory Visit: Payer: Self-pay | Admitting: Neurology

## 2018-03-25 ENCOUNTER — Encounter: Payer: Self-pay | Admitting: Family Medicine

## 2018-03-26 ENCOUNTER — Other Ambulatory Visit (HOSPITAL_COMMUNITY)
Admission: RE | Admit: 2018-03-26 | Discharge: 2018-03-26 | Disposition: A | Discharge status: Home / Self Care | Payer: Medicaid Other | Source: Ambulatory Visit | Attending: Family Medicine | Admitting: Family Medicine

## 2018-03-26 ENCOUNTER — Ambulatory Visit (INDEPENDENT_AMBULATORY_CARE_PROVIDER_SITE_OTHER): Payer: Medicaid Other | Admitting: Family Medicine

## 2018-03-26 ENCOUNTER — Other Ambulatory Visit: Payer: Self-pay

## 2018-03-26 ENCOUNTER — Encounter: Payer: Self-pay | Admitting: Family Medicine

## 2018-03-26 VITALS — BP 110/70 | HR 78 | Temp 98.5°F | Wt 200.0 lb

## 2018-03-26 DIAGNOSIS — B373 Candidiasis of vulva and vagina: Secondary | ICD-10-CM | POA: Insufficient documentation

## 2018-03-26 DIAGNOSIS — R300 Dysuria: Secondary | ICD-10-CM | POA: Diagnosis not present

## 2018-03-26 DIAGNOSIS — B3731 Acute candidiasis of vulva and vagina: Secondary | ICD-10-CM

## 2018-03-26 LAB — POCT WET PREP (WET MOUNT)
Clue Cells Wet Prep Whiff POC: NEGATIVE
Trichomonas Wet Prep HPF POC: ABSENT

## 2018-03-26 LAB — POCT URINALYSIS DIP (MANUAL ENTRY)
GLUCOSE UA: NEGATIVE mg/dL
Leukocytes, UA: NEGATIVE
NITRITE UA: NEGATIVE
PROTEIN UA: NEGATIVE mg/dL
RBC UA: NEGATIVE
Spec Grav, UA: 1.025 (ref 1.010–1.025)
UROBILINOGEN UA: 1 U/dL
pH, UA: 6 (ref 5.0–8.0)

## 2018-03-26 MED ORDER — FLUCONAZOLE 150 MG PO TABS: 150.0000 mg | ORAL_TABLET | Freq: Once | ORAL | 0 refills | Status: AC

## 2018-03-26 NOTE — Progress Notes (Signed)
    Subjective:  Alicia Barker is a 32 y.o. female who presents to the [facility] today with a chief complaint of vaginal discharge.   HPI:  Has been having increased vaginal discharge for the last 1 week. No itching  but some  Irritation causing some pain with urination. Does seem to be urinating more than usual with darker urine. No vaginal bleeding No urinary urgency. No fever/chills, abdominal pain.  ROS: Per HPI  Objective:  Physical Exam: BP 110/70   Pulse 78   Temp 98.5 F (36.9 C) (Oral)   Wt 200 lb (90.7 kg)   LMP 03/06/2018   SpO2 99%   BMI 34.33 kg/m   Gen: NAD, resting comfortably Pulm: NWOB on room air GI: Normal bowel sounds present. Soft, Nontender, Nondistended. No CVA tenderness Pelvic exam: VULVA appears midly erythematous with slight irritation. Vagina with thick white discharge. Normal cervix Skin: warm, dry Neuro: grossly normal, moves all extremities Psych: Normal affect and thought content  Results for orders placed or performed in visit on 03/26/18 (from the past 72 hour(s))  POCT urinalysis dipstick     Status: Abnormal   Collection Time: 03/26/18  2:25 PM  Result Value Ref Range   Color, UA yellow yellow   Clarity, UA clear clear   Glucose, UA negative negative mg/dL   Bilirubin, UA small (A) negative   Ketones, POC UA trace (5) (A) negative mg/dL   Spec Grav, UA 8.003 4.917 - 1.025   Blood, UA negative negative   pH, UA 6.0 5.0 - 8.0   Protein Ur, POC negative negative mg/dL   Urobilinogen, UA 1.0 0.2 or 1.0 E.U./dL   Nitrite, UA Negative Negative   Leukocytes, UA Negative Negative  POCT Wet Prep [facility])     Status: Abnormal   Collection Time: 03/26/18  2:35 PM  Result Value Ref Range   Source Wet Prep POC VAG    WBC, Wet Prep HPF POC 1-3    Bacteria Wet Prep HPF POC Many (A) Few   Clue Cells Wet Prep HPF POC None None   Clue Cells Wet Prep Whiff POC Negative Whiff    Yeast Wet Prep HPF POC Few (A) None   KOH Wet Prep POC Few (A) None     Trichomonas Wet Prep HPF POC Absent Absent     Assessment/Plan:  Candidal vaginitis Wet prep consistent with yeast infection. Will treat with diflucan. Dysuria is likely from referred irritation of external area as UA is not consistent with UTI. STD screening obtained today - POCT Wet Prep [facility]) - Cervicovaginal ancillary only - fluconazole (DIFLUCAN) 150 MG tablet; Take 1 tablet (150 mg total) by mouth once for 1 dose.  Dispense: 1 tablet; Refill: 0 - POCT urinalysis dipstick   Leland Her, DO PGY-3, [facility] 03/26/2018 2:36 PM

## 2018-03-26 NOTE — Patient Instructions (Signed)

## 2018-03-27 LAB — CERVICOVAGINAL ANCILLARY ONLY
Chlamydia: NEGATIVE
Neisseria Gonorrhea: POSITIVE — AB

## 2018-03-28 ENCOUNTER — Telehealth (HOSPITAL_COMMUNITY): Payer: Self-pay | Admitting: Emergency Medicine

## 2018-03-28 ENCOUNTER — Other Ambulatory Visit: Payer: Self-pay

## 2018-03-28 ENCOUNTER — Encounter (HOSPITAL_COMMUNITY): Payer: Self-pay

## 2018-03-28 ENCOUNTER — Ambulatory Visit (HOSPITAL_COMMUNITY)
Admission: EM | Admit: 2018-03-28 | Discharge: 2018-03-28 | Disposition: A | Discharge status: Home / Self Care | Payer: Medicaid Other | Attending: Family Medicine | Admitting: Family Medicine

## 2018-03-28 DIAGNOSIS — Z880 Allergy status to penicillin: Secondary | ICD-10-CM

## 2018-03-28 DIAGNOSIS — A549 Gonococcal infection, unspecified: Secondary | ICD-10-CM

## 2018-03-28 MED ORDER — GEMIFLOXACIN MESYLATE 320 MG PO TABS: 320.0000 mg | ORAL_TABLET | Freq: Once | ORAL | 0 refills | Status: AC

## 2018-03-28 MED ORDER — AZITHROMYCIN 250 MG PO TABS
ORAL_TABLET | ORAL | Status: AC
  Filled 2018-03-28: qty 8

## 2018-03-28 MED ORDER — AZITHROMYCIN 500 MG PO TABS: 2000.0000 mg | ORAL_TABLET | Freq: Once | ORAL | 0 refills | Status: AC

## 2018-03-28 MED ORDER — AZITHROMYCIN 250 MG PO TABS
2000.0000 mg | ORAL_TABLET | Freq: Once | ORAL | Status: AC
  Administered 2018-03-28: 2000 mg via ORAL

## 2018-03-28 MED ORDER — ONDANSETRON HCL 4 MG PO TABS: 4.0000 mg | ORAL_TABLET | ORAL | 0 refills | Status: DC | PRN

## 2018-03-28 NOTE — Discharge Instructions (Addendum)
Given azithromycin 2g in office gemifloxacin 320 mg prescribed.  Take once for complete treatment of gonorrhea.   Please abstain from sex for at least 7 days.  Notify partners.  Using condoms may reduce the risk of infection Follow up with PCP or with community health if symptoms persists Return here or go to ER if you have any new or worsening symptoms    Reviewed expectations re: course of current medical issues. Questions

## 2018-03-28 NOTE — Telephone Encounter (Signed)
Pt called stating she threw up all the zithromax and her stomach is upset. SPoke with [place] PA, [place] sent zofran and repeat zithromax dose to pts preferred pharmacy. Instructed patient to take the zofran, wait 30 minutes, eat some crackers/fluid, and if able to tolerate, slowly retake antibiotics. Pt agreeable to plan, no further questions.

## 2018-03-28 NOTE — ED Triage Notes (Signed)
Pt presents today with a positive test for Gonorrhea that was done yesterday at [facility]. Was told to go to an urgent care for treatment.

## 2018-03-28 NOTE — Telephone Encounter (Signed)
Instructed patient to return tomorrow morning (Sunday-03/29/18) for further treatment.  Informed patient that she would be getting several injections at this visit or could go to health department for treatment.  Patient said she would come tomorrow for treatment

## 2018-03-28 NOTE — Telephone Encounter (Signed)
Pharmacy called to clarify medication dosing.  [place] wurst PA clarified azithromycin dosong

## 2018-03-28 NOTE — ED Provider Notes (Signed)
[facility]   [id number] 03/28/18 Arrival Time: 1256   CC:STD treatment  SUBJECTIVE:  Alicia Barker is a 32 y.o. female who presents with complaints with positive STI test.  Currently asymptomatic.  Last unprotected sex 1 week ago.  Has 1 female partner.   She denies fever, chills, nausea, vomiting, abdominal or pelvic pain, urinary symptoms, vaginal itching, vaginal odor, vaginal bleeding, dyspareunia, vaginal rashes or lesions.   Patient's last menstrual period was 03/06/2018.  ROS: As per HPI.  Past Medical History:  Diagnosis Date  . Depression    no meds currently  . MS (multiple sclerosis) (HCC)   . SVD (spontaneous vaginal delivery)    x 5   Past Surgical History:  Procedure Laterality Date  . LAPAROSCOPIC TUBAL LIGATION Bilateral 01/06/2013   Procedure: LAPAROSCOPIC TUBAL LIGATION;  Surgeon: Kathreen Cosier, MD;  Location: [facility];  Service: Gynecology;  Laterality: Bilateral;  . right hand surgery     Allergies  Allergen Reactions  . Penicillins Anaphylaxis, Shortness Of Breath and Swelling    Has patient had a PCN reaction causing immediate rash, facial/tongue/throat swelling, SOB or lightheadedness with hypotension: YES Has patient had a PCN reaction causing severe rash involving mucus membranes or skin necrosis: NO Has patient had a PCN reaction that required hospitalization NO Has patient had a PCN reaction occurring within the last 10 years: NO If all of the above answers are "NO", then may proceed with Cephalosporin use.   No current facility-administered medications on file prior to encounter.    Current Outpatient Medications on File Prior to Encounter  Medication Sig Dispense Refill  . acetaminophen-codeine (TYLENOL #3) 300-30 MG tablet TAKE 1 TABLET BY MOUTH THREE TIMES DAILY 90 tablet 0  . cholecalciferol (VITAMIN D) 1000 units tablet Take 5,000 Units by mouth daily.    . cyclobenzaprine (FLEXERIL) 5 MG tablet TAKE 1 TABLET BY MOUTH EVERY  NIGHT AT BEDTIME. TAKE UP TO THREE TIMES DAILY 90 tablet 11  . meclizine (ANTIVERT) 25 MG tablet Take 1 tablet (25 mg total) by mouth 3 (three) times daily as needed for dizziness. 30 tablet 0  . ondansetron (ZOFRAN) 4 MG tablet Take 1 tablet (4 mg total) by mouth every 6 (six) hours. Prn nausea or vomiting 12 tablet 3    Social History   Socioeconomic History  . Marital status: Married    Spouse name: Not on file  . Number of children: Not on file  . Years of education: Not on file  . Highest education level: Not on file  Occupational History  . Not on file  Social Needs  . Financial resource strain: Not on file  . Food insecurity:    Worry: Not on file    Inability: Not on file  . Transportation needs:    Medical: Not on file    Non-medical: Not on file  Tobacco Use  . Smoking status: Former Smoker    Packs/day: 0.10    Types: Cigarettes  . Smokeless tobacco: Never Used  Substance and Sexual Activity  . Alcohol use: No    Alcohol/week: 0.0 standard drinks  . Drug use: No  . Sexual activity: Yes    Birth control/protection: None  Lifestyle  . Physical activity:    Days per week: Not on file    Minutes per session: Not on file  . Stress: Not on file  Relationships  . Social connections:    Talks on phone: Not on file  Gets together: Not on file    Attends religious service: Not on file    Active member of club or organization: Not on file    Attends meetings of clubs or organizations: Not on file    Relationship status: Not on file  . Intimate partner violence:    Fear of current or ex partner: Not on file    Emotionally abused: Not on file    Physically abused: Not on file    Forced sexual activity: Not on file  Other Topics Concern  . Not on file  Social History Narrative  . Not on file   Family History  Problem Relation Age of Onset  . Diabetes Mother   . Heart disease Mother   . Diabetes Father   . Multiple sclerosis Cousin      OBJECTIVE:  Vitals:   03/28/18 1306  BP: 106/72  Pulse: 70  Resp: 16  Temp: 99.1 F (37.3 C)  TempSrc: Oral  SpO2: 98%     General appearance: alert, cooperative, appears stated age and no distress Throat: lips, mucosa, and tongue normal; teeth and gums normal Lungs: CTA bilaterally without adventitious breath sounds Heart: regular rate and rhythm.  Radial pulses 2+ symmetrical bilaterally Back: no CVA tenderness Abdomen: soft, non-tender; bowel sounds normal; no masses or organomegaly; no guarding Skin: warm and dry Psychological:  Alert and cooperative. Normal mood and affect.  Results for orders placed or performed in visit on 03/26/18  POCT urinalysis dipstick  Result Value Ref Range   Color, UA yellow yellow   Clarity, UA clear clear   Glucose, UA negative negative mg/dL   Bilirubin, UA small (A) negative   Ketones, POC UA trace (5) (A) negative mg/dL   Spec Grav, UA 1.610 9.604 - 1.025   Blood, UA negative negative   pH, UA 6.0 5.0 - 8.0   Protein Ur, POC negative negative mg/dL   Urobilinogen, UA 1.0 0.2 or 1.0 E.U./dL   Nitrite, UA Negative Negative   Leukocytes, UA Negative Negative  POCT Wet Prep (Wet [place])  Result Value Ref Range   Source Wet Prep POC VAG    WBC, Wet Prep HPF POC 1-3    Bacteria Wet Prep HPF POC Many (A) Few   Clue Cells Wet Prep HPF POC None None   Clue Cells Wet Prep Whiff POC Negative Whiff    Yeast Wet Prep HPF POC Few (A) None   KOH Wet Prep POC Few (A) None   Trichomonas Wet Prep HPF POC Absent Absent  Cervicovaginal ancillary only  Result Value Ref Range   Chlamydia Negative    Neisseria gonorrhea **POSITIVE** (A)     Labs Reviewed - No data to display  ASSESSMENT & PLAN:  1. Gonorrhea   2. Penicillin allergy     Meds ordered this encounter  Medications  . azithromycin (ZITHROMAX) tablet 2,000 mg  . gemifloxacin (FACTIVE) 320 MG tablet    Sig: Take 1 tablet (320 mg total) by mouth once for 1 dose.    Dispense:   1 tablet    Refill:  0    Order Specific Question:   Supervising Provider    Answer:   Isa Rankin [[id number]]    Pending: Labs Reviewed - No data to display  Given azithromycin 2g in office gemifloxacin 320 mg prescribed.  Take once for complete treatment of gonorrhea.   Recommended treatment for gonorrhea per CDC guidelines in patient with PCN allergy.   Please abstain  from sex for at least 7 days.  Notify partners.  Using condoms may reduce the risk of infection Follow up with PCP or with community health if symptoms persists Return here or go to ER if you have any new or worsening symptoms    Reviewed expectations re: course of current medical issues. Questions answered. Outlined signs and symptoms indicating need for more acute intervention. Patient verbalized understanding. After Visit Summary given.       Rennis Harding, PA-C 03/28/18 1342

## 2018-03-28 NOTE — ED Notes (Signed)
Bed: UC01 Expected date:  Expected time:  Means of arrival:  Comments: 

## 2018-03-29 ENCOUNTER — Ambulatory Visit (HOSPITAL_COMMUNITY)
Admission: EM | Admit: 2018-03-29 | Discharge: 2018-03-29 | Disposition: A | Discharge status: Home / Self Care | Payer: Medicaid Other | Attending: Emergency Medicine | Admitting: Emergency Medicine

## 2018-03-29 DIAGNOSIS — A549 Gonococcal infection, unspecified: Secondary | ICD-10-CM | POA: Diagnosis not present

## 2018-03-29 MED ORDER — GENTAMICIN SULFATE 40 MG/ML IJ SOLN
240.0000 mg | Freq: Once | INTRAMUSCULAR | Status: AC
Start: 2018-03-29 — End: 2018-03-29
  Administered 2018-03-29: 240 mg via INTRAMUSCULAR
  Filled 2018-03-29: qty 6

## 2018-03-29 NOTE — ED Triage Notes (Addendum)
Pt states she is unable to afford the medication that was sent to the pharmacy. Here for other alternatives for treatment. Pt will take the shots for treatment, [place] PA will place order and pt will be seen as a nurse visit.

## 2018-03-29 NOTE — ED Notes (Signed)
Bed: UC10 Expected date:  Expected time:  Means of arrival:  Comments: 

## 2018-04-02 ENCOUNTER — Telehealth: Payer: Self-pay | Admitting: Neurology

## 2018-04-02 ENCOUNTER — Other Ambulatory Visit: Payer: Self-pay | Admitting: Neurology

## 2018-04-02 MED ORDER — AMPHETAMINE-DEXTROAMPHETAMINE 10 MG PO TABS: 10.0000 mg | ORAL_TABLET | Freq: Two times a day (BID) | ORAL | 0 refills | Status: DC

## 2018-04-02 MED ORDER — TRAMADOL HCL 50 MG PO TABS: 50.0000 mg | ORAL_TABLET | Freq: Three times a day (TID) | ORAL | 0 refills | Status: DC | PRN

## 2018-04-02 NOTE — Addendum Note (Signed)
Addended by: Candis Schatz I on: 04/02/2018 04:40 PM   Modules accepted: Orders

## 2018-04-02 NOTE — Telephone Encounter (Signed)
Pt is asking for a call from Rn Faith to discuss her medications

## 2018-04-02 NOTE — Addendum Note (Signed)
Addended by: Asa Lente on: 04/02/2018 05:19 PM   Modules accepted: Orders

## 2018-04-03 NOTE — Telephone Encounter (Signed)
Rx printed, signed, faxed and confirmed to [facility].

## 2018-04-07 ENCOUNTER — Encounter: Payer: Medicaid Other | Admitting: Family Medicine

## 2018-04-08 NOTE — Telephone Encounter (Signed)
Pt requesting a call, stating her insurance will not cover the full cost of Adderall. Please call to advise

## 2018-04-09 NOTE — Telephone Encounter (Signed)
Spoke with Psychologist, occupational, who sts. Medicaid is covering pt's generic Adderall, #60/30 for her $3 copay.  Spoke with pt. and advised her of this. She verbalized understanding of same, sts. has no other issues and will pick rx. up from the pharmacy/fim

## 2018-04-30 ENCOUNTER — Other Ambulatory Visit: Payer: Self-pay | Admitting: Neurology

## 2018-04-30 MED ORDER — ACETAMINOPHEN-CODEINE #3 300-30 MG PO TABS: 1.0000 | ORAL_TABLET | Freq: Three times a day (TID) | ORAL | 0 refills | Status: DC

## 2018-04-30 NOTE — Telephone Encounter (Signed)
Spoke with Alicia Barker and explained she should be taking Tyl. #3 or Tramadol, not both together (per previous conversation.)  She sts. she feels she gets the most benefit from Tyl. #3. Will as RAS to send this rx. to [facility], and Tramadol will be d/c from her med list/fim

## 2018-04-30 NOTE — Telephone Encounter (Signed)
Pt has called for a refill on her acetaminophen-codeine (TYLENOL #3) 300-30 MG tablet and traMADol (ULTRAM) 50 MG tablet [id number] - , [place] - [address] AT [address] [phone] (Phone) [phone] (Fax)

## 2018-04-30 NOTE — Addendum Note (Signed)
Addended by: Candis Schatz I on: 04/30/2018 10:46 AM   Modules accepted: Orders

## 2018-05-19 ENCOUNTER — Telehealth: Payer: Self-pay | Admitting: Neurology

## 2018-05-19 NOTE — Telephone Encounter (Signed)
Pt is asking for a call back to clarify how much Vitamin D she is supposed to take, please call

## 2018-05-19 NOTE — Telephone Encounter (Signed)
LMOM that Alicia Barker should be taking Vit. D3 5,000u once daily./fim

## 2018-05-24 ENCOUNTER — Other Ambulatory Visit: Payer: Self-pay | Admitting: Neurology

## 2018-06-01 ENCOUNTER — Other Ambulatory Visit: Payer: Self-pay | Admitting: Neurology

## 2018-06-01 MED ORDER — ACETAMINOPHEN-CODEINE #3 300-30 MG PO TABS: 1.0000 | ORAL_TABLET | Freq: Three times a day (TID) | ORAL | 0 refills | Status: DC

## 2018-06-01 MED ORDER — AMPHETAMINE-DEXTROAMPHETAMINE 10 MG PO TABS: 10.0000 mg | ORAL_TABLET | Freq: Two times a day (BID) | ORAL | 0 refills | Status: DC

## 2018-06-01 NOTE — Telephone Encounter (Signed)
Alicia Barker- can you f/u with pt on this? I don't think we received this, thank you

## 2018-06-01 NOTE — Telephone Encounter (Signed)
Pt requesting a call stating that her dentist office had faxed over paperwork for Dr. Epimenio Foot 2 weeks ago but hadn't heard anything back. Requesting a call to discuss

## 2018-06-01 NOTE — Telephone Encounter (Signed)
I called pt she is having her dentist office re fax over the paper work. Pt also requested  a refill on the Tyl #3 to be called in asap to [facility].

## 2018-06-02 ENCOUNTER — Telehealth: Payer: Self-pay | Admitting: Neurology

## 2018-06-02 NOTE — Telephone Encounter (Signed)
I called pt. Advised adderall 10mg  sent to pharmacy yesterday. Pt verbalized understanding and appreciation for call.

## 2018-06-02 NOTE — Telephone Encounter (Signed)
Pt came in needing to get refill on her Adderall 10mg . Please call [phone] with status.

## 2018-06-02 NOTE — Telephone Encounter (Signed)
Faxed rx to [facility] at [phone]. Received fax confirmation.

## 2018-06-16 ENCOUNTER — Ambulatory Visit: Payer: Medicaid Other | Admitting: Obstetrics

## 2018-07-10 ENCOUNTER — Other Ambulatory Visit: Payer: Self-pay

## 2018-07-10 ENCOUNTER — Inpatient Hospital Stay (HOSPITAL_COMMUNITY)
Admission: EM | Admit: 2018-07-10 | Discharge: 2018-07-17 | DRG: 059 | Disposition: A | Discharge status: Home Health | Payer: Medicaid Other | Attending: Family Medicine | Admitting: Family Medicine

## 2018-07-10 ENCOUNTER — Encounter (HOSPITAL_COMMUNITY): Payer: Self-pay | Admitting: Emergency Medicine

## 2018-07-10 ENCOUNTER — Emergency Department (HOSPITAL_COMMUNITY): Payer: Medicaid Other

## 2018-07-10 DIAGNOSIS — F988 Other specified behavioral and emotional disorders with onset usually occurring in childhood and adolescence: Secondary | ICD-10-CM | POA: Diagnosis present

## 2018-07-10 DIAGNOSIS — M62830 Muscle spasm of back: Secondary | ICD-10-CM | POA: Diagnosis present

## 2018-07-10 DIAGNOSIS — F329 Major depressive disorder, single episode, unspecified: Secondary | ICD-10-CM | POA: Diagnosis present

## 2018-07-10 DIAGNOSIS — H469 Unspecified optic neuritis: Secondary | ICD-10-CM

## 2018-07-10 DIAGNOSIS — D539 Nutritional anemia, unspecified: Secondary | ICD-10-CM | POA: Diagnosis present

## 2018-07-10 DIAGNOSIS — G35 Multiple sclerosis: Secondary | ICD-10-CM | POA: Diagnosis not present

## 2018-07-10 DIAGNOSIS — F902 Attention-deficit hyperactivity disorder, combined type: Secondary | ICD-10-CM | POA: Diagnosis not present

## 2018-07-10 DIAGNOSIS — Z79899 Other long term (current) drug therapy: Secondary | ICD-10-CM | POA: Diagnosis not present

## 2018-07-10 DIAGNOSIS — E6609 Other obesity due to excess calories: Secondary | ICD-10-CM | POA: Diagnosis not present

## 2018-07-10 DIAGNOSIS — D649 Anemia, unspecified: Secondary | ICD-10-CM | POA: Diagnosis not present

## 2018-07-10 DIAGNOSIS — Z8249 Family history of ischemic heart disease and other diseases of the circulatory system: Secondary | ICD-10-CM | POA: Diagnosis not present

## 2018-07-10 DIAGNOSIS — R269 Unspecified abnormalities of gait and mobility: Secondary | ICD-10-CM | POA: Diagnosis not present

## 2018-07-10 DIAGNOSIS — Z9851 Tubal ligation status: Secondary | ICD-10-CM

## 2018-07-10 DIAGNOSIS — Z820 Family history of epilepsy and other diseases of the nervous system: Secondary | ICD-10-CM

## 2018-07-10 DIAGNOSIS — Z87891 Personal history of nicotine dependence: Secondary | ICD-10-CM | POA: Diagnosis not present

## 2018-07-10 DIAGNOSIS — Z880 Allergy status to penicillin: Secondary | ICD-10-CM | POA: Diagnosis not present

## 2018-07-10 DIAGNOSIS — Z791 Long term (current) use of non-steroidal anti-inflammatories (NSAID): Secondary | ICD-10-CM

## 2018-07-10 DIAGNOSIS — H5462 Unqualified visual loss, left eye, normal vision right eye: Secondary | ICD-10-CM | POA: Diagnosis present

## 2018-07-10 DIAGNOSIS — H53132 Sudden visual loss, left eye: Secondary | ICD-10-CM | POA: Diagnosis not present

## 2018-07-10 DIAGNOSIS — Z6830 Body mass index (BMI) 30.0-30.9, adult: Secondary | ICD-10-CM | POA: Diagnosis not present

## 2018-07-10 DIAGNOSIS — Z833 Family history of diabetes mellitus: Secondary | ICD-10-CM

## 2018-07-10 DIAGNOSIS — Z4901 Encounter for fitting and adjustment of extracorporeal dialysis catheter: Secondary | ICD-10-CM | POA: Diagnosis not present

## 2018-07-10 DIAGNOSIS — H468 Other optic neuritis: Secondary | ICD-10-CM | POA: Diagnosis present

## 2018-07-10 DIAGNOSIS — H538 Other visual disturbances: Secondary | ICD-10-CM | POA: Diagnosis present

## 2018-07-10 LAB — I-STAT BETA HCG BLOOD, ED (MC, WL, AP ONLY): I-stat hCG, quantitative: <5 m[IU]/mL (ref <5)

## 2018-07-10 LAB — URINALYSIS, ROUTINE W REFLEX MICROSCOPIC
Bilirubin Urine: NEGATIVE
Glucose, UA: NEGATIVE mg/dL
Hgb urine dipstick: NEGATIVE
Ketones, ur: NEGATIVE mg/dL
LEUKOCYTES UA: NEGATIVE
Nitrite: NEGATIVE
Protein, ur: NEGATIVE mg/dL
Specific Gravity, Urine: 1.03 (ref 1.005–1.030)
pH: 6 (ref 5.0–8.0)

## 2018-07-10 LAB — COMPREHENSIVE METABOLIC PANEL
ALT: 23 U/L (ref 0–44)
AST: 19 U/L (ref 15–41)
Albumin: 3.3 g/dL — ABNORMAL LOW (ref 3.5–5.0)
Alkaline Phosphatase: 55 U/L (ref 38–126)
Anion gap: 7 (ref 5–15)
BUN: 10 mg/dL (ref 6–20)
CO2: 23 mmol/L (ref 22–32)
Calcium: 9 mg/dL (ref 8.9–10.3)
Chloride: 107 mmol/L (ref 98–111)
Creatinine, Ser: 0.81 mg/dL (ref 0.44–1.00)
GFR calc Af Amer: >60 mL/min (ref >60)
Glucose, Bld: 96 mg/dL (ref 70–99)
POTASSIUM: 3.8 mmol/L (ref 3.5–5.1)
Sodium: 137 mmol/L (ref 135–145)
Total Bilirubin: 0.5 mg/dL (ref 0.3–1.2)
Total Protein: 6.2 g/dL — ABNORMAL LOW (ref 6.5–8.1)

## 2018-07-10 LAB — CBC WITH DIFFERENTIAL/PLATELET
Abs Immature Granulocytes: 0.05 10*3/uL (ref 0.00–0.07)
Basophils Absolute: 0 10*3/uL (ref 0.0–0.1)
Basophils Relative: 1 %
Eosinophils Absolute: 0.4 10*3/uL (ref 0.0–0.5)
Eosinophils Relative: 6 %
HCT: 35.1 % — ABNORMAL LOW (ref 36.0–46.0)
Hemoglobin: 11 g/dL — ABNORMAL LOW (ref 12.0–15.0)
Immature Granulocytes: 1 %
Lymphocytes Relative: 20 %
Lymphs Abs: 1.2 10*3/uL (ref 0.7–4.0)
MCH: 27.9 pg (ref 26.0–34.0)
MCHC: 31.3 g/dL (ref 30.0–36.0)
MCV: 89.1 fL (ref 80.0–100.0)
Monocytes Absolute: 0.5 10*3/uL (ref 0.1–1.0)
Monocytes Relative: 9 %
NEUTROS PCT: 63 %
Neutro Abs: 3.8 10*3/uL (ref 1.7–7.7)
Platelets: 232 10*3/uL (ref 150–400)
RBC: 3.94 MIL/uL (ref 3.87–5.11)
RDW: 14.2 % (ref 11.5–15.5)
WBC: 5.9 10*3/uL (ref 4.0–10.5)
nRBC: 0 % (ref 0.0–0.2)

## 2018-07-10 MED ORDER — ACETAMINOPHEN 325 MG PO TABS: 650.0000 mg | ORAL_TABLET | Freq: Four times a day (QID) | ORAL | Status: DC | PRN

## 2018-07-10 MED ORDER — SODIUM CHLORIDE 0.9 % IV SOLN
500.0000 mg | Freq: Every day | INTRAVENOUS | Status: DC
  Administered 2018-07-10: 500 mg via INTRAVENOUS
  Filled 2018-07-10 (×2): qty 4

## 2018-07-10 MED ORDER — GADOBUTROL 1 MMOL/ML IV SOLN
9.0000 mL | Freq: Once | INTRAVENOUS | Status: AC | PRN
  Administered 2018-07-10: 9 mL via INTRAVENOUS

## 2018-07-10 MED ORDER — ENOXAPARIN SODIUM 40 MG/0.4ML ~~LOC~~ SOLN
40.0000 mg | SUBCUTANEOUS | Status: DC
  Administered 2018-07-10 – 2018-07-13 (×4): 40 mg via SUBCUTANEOUS
  Filled 2018-07-10 (×4): qty 0.4

## 2018-07-10 MED ORDER — ACETAMINOPHEN 650 MG RE SUPP: 650.0000 mg | Freq: Four times a day (QID) | RECTAL | Status: DC | PRN

## 2018-07-10 MED ORDER — AMPHETAMINE-DEXTROAMPHETAMINE 10 MG PO TABS: 10.0000 mg | ORAL_TABLET | Freq: Two times a day (BID) | ORAL | Status: DC | PRN

## 2018-07-10 MED ORDER — ACETAMINOPHEN 325 MG PO TABS
650.0000 mg | ORAL_TABLET | Freq: Four times a day (QID) | ORAL | Status: DC | PRN
  Administered 2018-07-16 – 2018-07-17 (×2): 650 mg via ORAL
  Filled 2018-07-10: qty 2

## 2018-07-10 MED ORDER — PANTOPRAZOLE SODIUM 40 MG PO TBEC
40.0000 mg | DELAYED_RELEASE_TABLET | Freq: Every day | ORAL | Status: DC
  Administered 2018-07-11 – 2018-07-17 (×7): 40 mg via ORAL
  Filled 2018-07-10 (×7): qty 1

## 2018-07-10 MED ORDER — IBUPROFEN 400 MG PO TABS
400.0000 mg | ORAL_TABLET | Freq: Four times a day (QID) | ORAL | Status: DC | PRN
  Administered 2018-07-16: 400 mg via ORAL
  Filled 2018-07-10: qty 1

## 2018-07-10 MED ORDER — LORAZEPAM 2 MG/ML IJ SOLN
0.5000 mg | Freq: Once | INTRAMUSCULAR | Status: AC
  Administered 2018-07-10: 0.5 mg via INTRAVENOUS
  Filled 2018-07-10: qty 1

## 2018-07-10 NOTE — ED Triage Notes (Signed)
Pt pt she comes in due to her left eye sight becoming less.  Pt has a history of MS.  NAD noted at this time. Denies Pain AOx4

## 2018-07-10 NOTE — ED Notes (Signed)
Admitting team bedside for evaluation and planning 

## 2018-07-10 NOTE — H&P (Addendum)
Family Medicine Teaching [facility] Admission History and Physical Service Pager: [phone]  Patient name: Alicia Barker Medical record number: [id number] Date of birth: August 13, 1985 Age: 32 y.o. Gender: female  Primary Care Provider: Wendee Beavers, DO Consultants: Neuro Code Status: Full  Chief Complaint: Left-eye vision change  Assessment and Plan: Alicia Barker is a 32 y.o. female presenting with left-eye vision changes. PMH is significant for MS.   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 Brain MRI on admission showing active demyelination to the left optic nerve, corresponding with patient's decreased left visual acuity.  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 -Admit to med-surg, Attending Dr. McDiarmid -Neuro consulted - appreciate recs -IV Methylprednisolone x 5 days (11/29 - 12/3), start with 500 mg, can adjust pending neuro recs -Vitals per routine -Tylenol/ibuprofen as needed pain/headache -Up with assistance -PT/OT  Anemia: Hgb found to be 11.0 on admission. Previous values between 10.8 and 12.6.  -Monitor  Muscle spasms: patient previously prescribed Flexeril for occasional back spasm. Has not been taking recently. Denies back spasms on admission. -Hold Flexeril -Monitor  Attention deficit Disorder: patient takes Adderall 10mg  BID PRN.  -Continue Adderall   FEN/GI: No mIVF, regular diet Prophylaxis: Lovenox  Disposition: Discharge home after 5-day course of steroids  History of Present Illness:  Alicia Barker is a 32 y.o. female presenting with 24 hours of progressively worsening vision from her left eye, that started as blurry and dark and is now completely black. The patient has a history of multiple sclerosis diagnosed in 2017.  She states  that she occasionally has flares that usually consist of headaches, slurred speech or gait instability, with only occasional blurred vision and nystagmus. She follows with [facility] neurology outpatient and is taking Ocrevus every 6 months, her last does was in September 2019. She reports no other problems, symptoms, or concerns.   Review Of Systems: Per HPI with the following additions:   Review of Systems  Eyes: Positive for blurred vision (Left eye, progressed to completely blind), photophobia and pain (Left eye). Negative for double vision, discharge and redness.  Neurological: Negative for sensory change, focal weakness, weakness and headaches.   Patient Active Problem List   Diagnosis Date Noted  . Stress incontinence 07/25/2017  . Neck pain 03/12/2017  . Acute upper back pain 12/23/2016  . Gait disturbance 06/25/2016  . Dysuria 04/10/2016  . Homeless family 04/10/2016  . Healthcare maintenance 04/10/2016  . Depression 03/28/2016  . Multiple sclerosis exacerbation (HCC) 02/22/2016  . Weakness   . Diplopia   . Left hip pain 01/18/2016  . Left sided sciatica 01/18/2016  . Numbness 11/28/2015  . Ataxia 11/28/2015  . Visual disturbance 11/28/2015  . Other fatigue 11/28/2015  . Urinary frequency 11/28/2015  . High risk medication use 11/28/2015  . Chest pain 11/25/2015  . 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 Dizziness   . Multiple sclerosis (HCC) 11/15/2015    Past Medical History: Past Medical History:  Diagnosis Date  . Depression    no meds currently  . MS (multiple sclerosis) (HCC)   . SVD (spontaneous vaginal delivery)    x 5   Past Surgical History: Past Surgical History:  Procedure Laterality Date  . LAPAROSCOPIC TUBAL LIGATION Bilateral 01/06/2013   Procedure: LAPAROSCOPIC TUBAL LIGATION;  Surgeon: Kathreen Cosier, MD;  Location: [facility];  Service: Gynecology;  Laterality: Bilateral;  . right hand surgery     Social History: Social History   Tobacco  Use  . Smoking status: Former Smoker    Packs/day: 0.10    Types: Cigarettes  . Smokeless tobacco: Never Used  Substance Use Topics  . Alcohol use: No    Alcohol/week: 0.0 standard drinks  . Drug use: No   Additional social history: None Please also refer to relevant sections of EMR.  Family History: Family History  Problem Relation Age of Onset  . Diabetes Mother   . Heart disease Mother   . Diabetes Father   . Multiple sclerosis Cousin    Allergies and Medications: Allergies  Allergen Reactions  . Penicillins Anaphylaxis, Shortness Of Breath and Swelling    Has patient had a PCN reaction causing immediate rash, facial/tongue/throat swelling, SOB or lightheadedness with hypotension: YES Has patient had a PCN reaction causing severe rash involving mucus membranes or skin necrosis: NO Has patient had a PCN reaction that required hospitalization NO Has patient had a PCN reaction occurring within the last 10 years: NO If all of the above answers are "NO", then may proceed with Cephalosporin use.   No current facility-administered medications on file prior to encounter.    Current Outpatient Medications on File Prior to Encounter  Medication Sig Dispense Refill  . acetaminophen-codeine (TYLENOL #3) 300-30 MG tablet Take 1 tablet by mouth 3 (three) times daily. 90 tablet 0  . amphetamine-dextroamphetamine (ADDERALL) 10 MG tablet Take 1 tablet (10 mg total) by mouth 2 (two) times daily. (Patient taking differently: Take 10 mg by mouth as needed. ) 60 tablet 0  . cyclobenzaprine (FLEXERIL) 5 MG tablet TAKE 1 TABLET BY MOUTH EVERY NIGHT AT BEDTIME. TAKE UP TO THREE TIMES DAILY (Patient taking differently: Take 5 mg by mouth as needed for muscle spasms. ) 90 tablet 11  . ibuprofen (ADVIL,MOTRIN) 800 MG tablet Take 800 mg by mouth as needed.  0  . ondansetron (ZOFRAN) 4 MG tablet Take 1 tablet (4 mg total) by mouth as needed for nausea or vomiting. 3 tablet 0  . meclizine (ANTIVERT) 25  MG tablet Take 1 tablet (25 mg total) by mouth 3 (three) times daily as needed for dizziness. (Patient not taking: Reported on 07/10/2018) 30 tablet 0   Objective: BP 105/69   Pulse 79   Temp 98.3 F (36.8 C)   Resp 18   Ht 5\' 4"  (1.626 m)   Wt 89.8 kg   LMP 06/25/2018 (Exact Date)   SpO2 99%   BMI 33.99 kg/m   Physical Exam  Constitutional: She is oriented to person, place, and time. She appears well-developed and well-nourished. No distress.  Eyes: Conjunctivae and EOM are normal.  Neck:  Acanthosis nigricans  Cardiovascular: Normal rate, regular rhythm, normal heart sounds and intact distal pulses.  Pulmonary/Chest: Effort normal and breath sounds normal. No respiratory distress.  Abdominal: Soft. Bowel sounds are normal. She exhibits no distension.  Musculoskeletal: Normal range of motion. She exhibits no edema or deformity.  Neurological: She is alert and oriented to person, place, and time. A cranial nerve deficit (Loss of consensual pupillary reflex in the R eye when light shines in the Left Eye; otherwise WNL) is present. No sensory deficit. She exhibits normal muscle tone. Coordination normal.  Skin: Skin is warm and dry.   Labs and Imaging: CBC BMET  Recent Labs  Lab 07/10/18 0733  WBC 5.9  HGB 11.0*  HCT 35.1*  PLT 232   Recent Labs  Lab 07/10/18 0733  NA 137  K 3.8  CL 107  CO2 23  BUN 10  CREATININE 0.81  GLUCOSE 96  CALCIUM 9.0     Mr Brain W And Wo Contrast  Result Date: 07/10/2018 CLINICAL DATA:  Multiple sclerosis with vision loss. Left eye visual loss. EXAM: MRI HEAD WITHOUT AND WITH CONTRAST TECHNIQUE: Multiplanar, multiecho pulse sequences of the brain and surrounding structures were obtained without and with intravenous contrast. CONTRAST:  9 mL Gadavist COMPARISON:  MRI brain 03/25/2016 FINDINGS: Brain: Restricted diffusion is evident within the left optic nerve. Associated T2 signal changes are best seen on the FLAIR images. A subcortical  lesion in the right frontal lobe on image 91 of series 16 and 39 of series 15 has increased in size since the prior exam. There is no associated restricted diffusion with this lesion. There is disc scratched at there is decreased conspicuity of lesions in the anterior inferior left frontal lobe and right parietal lobe. Brainstem lesions are less prominent on today's study. No other new lesions are present. No hemorrhage or mass lesion is present. The ventricles are of normal size. No significant extra-axial fluid collection is present. Vascular: Flow is present in the major intracranial arteries. Skull and upper cervical spine: The craniocervical junction is normal. Decreased marrow signal is present in the upper cervical spine. Sinuses/Orbits: Mild mucosal thickening, left greater than right is present in the ethmoid air cells bilaterally. The paranasal sinuses and mastoid air cells are otherwise clear. IMPRESSION: 1. New increased T2 signal and restricted diffusion in the left optic nerve is most consistent with active demyelination in this patient with known multiple sclerosis. This corresponds with patient's decreased left visual acuity. 2. Increased size of subcortical T2 lesion in the posterior right frontal lobe, also consistent with demyelination. 3. Multiple other lesions are decreased in size compared to the prior study. Findings are typical of waxing and waning multiple sclerosis. Electronically Signed   By: Marin Roberts M.D.   On: 07/10/2018 10:05   Dollene Cleveland, DO 07/10/2018, 1:35 PM PGY-1, [facility] FPTS Intern pager: [phone], text pages welcome  FPTS Upper-Level Resident Addendum   I have independently interviewed and examined the patient. I have discussed the above with the original author and agree with their documentation. My edits for correction/addition/clarification are in green. Please see also any attending notes.    Lennox Solders, MD PGY-2,  [facility] 07/10/2018 3:39 PM  FPTS Service pager: [phone] (text pages welcome through [facility])

## 2018-07-10 NOTE — Discharge Summary (Signed)
Family Medicine Teaching [facility] Discharge Summary  Patient name: Alicia Barker Medical record number: [id number] Date of birth: 1986/03/02 Age: 32 y.o. Gender: female Date of Admission: 07/10/2018  Date of Discharge: 07/11/2018 Admitting Physician: Lennox Solders, MD  Primary Care Provider: Wendee Beavers, DO Consultants: Neurology  Indication for Hospitalization: Left eye vision loss, multiple sclerosis exacerbation  Discharge Diagnoses/Problem List:  Multiple sclerosis Attention deficit disorder  Disposition: Discharge home  Discharge Condition: Stable  Discharge Exam:  Gen: NAD, alert, non-toxic, well-appearing, sitting comfortably  Skin: Warm and dry HEENT: NCAT.  MMM.  CV: RRR.  Normal S1-S2. No BLEE. Resp: CTAB. No increased WOB Abd: NTND on palpation to all 4 quadrants.  Pos bowel sounds Extremities: moves extremities spontaneously. Warm and well perfused.   Brief Hospital Course:  Alicia Barker is a 33 year old female with history of Multiple Sclerosis admitted with 24 hours of acute onset, progressively worsening left-eye vision loss. MRI of the Brain was performed and showed active demyelination of the left optic nerve corresponding with the patient's decreased left visual acuity.  Neurology was consulted and recommended 1 g daily doses if patient were able to get outpatient infusions.  Care management and home health were consulted and she was approved for home health follow-up.  She will receive IV Solu-Medrol 1 g from 11/29-12/3.  Patient also requested referral for ophthalmology as she has previously received a prescription for glasses but was unable to fill that prescription.  She is also concerned about her vision from this MS flare.  Prior to discharge, neurology approved for home health IV Solu-Medrol and had no further recommendations.  She has a scheduled follow-up with neurology outpatient. 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  The morning of discharge, she reported that she was unable to see out of her eye and can only return to work safely when her vision improves.   Issues for Follow Up:  1. Patient would like ophthalmology referral  2. Home health IV solumedrol, PIV site check  3. Work note, d/c'ed with no specified return date 4. Patient discharged on pantoprazole 40 mg daily x7 days for GI protection   Significant Labs and Imaging:  Recent Labs  Lab 07/10/18 0733 07/11/18 0607  WBC 5.9 17.4*  HGB 11.0* 11.5*  HCT 35.1* 36.3  PLT 232 279   Recent Labs  Lab 07/10/18 0733 07/11/18 0607  NA 137 134*  K 3.8 4.2  CL 107 103  CO2 23 21*  GLUCOSE 96 185*  BUN 10 11  CREATININE 0.81 0.78  CALCIUM 9.0 9.6  ALKPHOS 55  --   AST 19  --   ALT 23  --   ALBUMIN 3.3*  --    Mr Brain W And Wo Contrast  Result Date: 07/10/2018 CLINICAL DATA:  Multiple sclerosis with vision loss. Left eye visual loss. EXAM: MRI HEAD WITHOUT AND WITH CONTRAST TECHNIQUE: Multiplanar, multiecho pulse sequences of the brain and surrounding structures were obtained without and with intravenous contrast. CONTRAST:  9 mL Gadavist COMPARISON:  MRI brain 03/25/2016 FINDINGS: Brain: Restricted diffusion is evident within the left optic nerve. Associated T2 signal changes are best seen on the FLAIR images. A subcortical lesion in the right frontal lobe on image 91 of series 16 and 39 of series 15 has increased in size since the prior exam. There is  no associated restricted diffusion with this lesion. There is disc scratched at there is decreased conspicuity of lesions in the anterior inferior left frontal lobe and right parietal lobe. Brainstem lesions are less prominent on today's study. No other new lesions are present. No hemorrhage or mass lesion is present. The ventricles are of normal size. No significant extra-axial fluid  collection is present. Vascular: Flow is present in the major intracranial arteries. Skull and upper cervical spine: The craniocervical junction is normal. Decreased marrow signal is present in the upper cervical spine. Sinuses/Orbits: Mild mucosal thickening, left greater than right is present in the ethmoid air cells bilaterally. The paranasal sinuses and mastoid air cells are otherwise clear. IMPRESSION: 1. New increased T2 signal and restricted diffusion in the left optic nerve is most consistent with active demyelination in this patient with known multiple sclerosis. This corresponds with patient's decreased left visual acuity. 2. Increased size of subcortical T2 lesion in the posterior right frontal lobe, also consistent with demyelination. 3. Multiple other lesions are decreased in size compared to the prior study. Findings are typical of waxing and waning multiple sclerosis. Electronically Signed   By: Marin Roberts M.D.   On: 07/10/2018 10:05    Results/Tests Pending at Time of Discharge: HIV antibody  Discharge Medications:  Allergies as of 07/11/2018      Reactions   Penicillins Anaphylaxis, Shortness Of Breath, Swelling   Has patient had a PCN reaction causing immediate rash, facial/tongue/throat swelling, SOB or lightheadedness with hypotension: YES Has patient had a PCN reaction causing severe rash involving mucus membranes or skin necrosis: NO Has patient had a PCN reaction that required hospitalization NO Has patient had a PCN reaction occurring within the last 10 years: NO If all of the above answers are "NO", then may proceed with Cephalosporin use.      Medication List    TAKE these medications   acetaminophen-codeine 300-30 MG tablet Commonly known as:  TYLENOL #3 Take 1 tablet by mouth 3 (three) times daily.   amphetamine-dextroamphetamine 10 MG tablet Commonly known as:  ADDERALL Take 1 tablet (10 mg total) by mouth 2 (two) times daily. What changed:    when  to take this  reasons to take this   cyclobenzaprine 5 MG tablet Commonly known as:  FLEXERIL TAKE 1 TABLET BY MOUTH EVERY NIGHT AT BEDTIME. TAKE UP TO THREE TIMES DAILY What changed:  See the new instructions.   ibuprofen 800 MG tablet Commonly known as:  ADVIL,MOTRIN Take 800 mg by mouth as needed.   meclizine 25 MG tablet Commonly known as:  ANTIVERT Take 1 tablet (25 mg total) by mouth 3 (three) times daily as needed for dizziness.   methylPREDNISolone sodium succinate 1,000 mg in sodium chloride 0.9 % 50 mL Inject 1,000 mg into the vein daily for 3 days. Start taking on:  07/12/2018   ondansetron 4 MG tablet Commonly known as:  ZOFRAN Take 1 tablet (4 mg total) by mouth as needed for nausea or vomiting.   pantoprazole 40 MG tablet Commonly known as:  PROTONIX Take 1 tablet (40 mg total) by mouth daily. Start taking on:  07/12/2018            Durable Medical Equipment  (From admission, onward)         Start     Ordered   07/11/18 0922  For home use only DME Tub bench  Once     07/11/18 [phone]  Discharge Instructions: Please refer to Patient Instructions section of EMR for full details.  Patient was counseled important signs and symptoms that should prompt return to medical care, changes in medications, dietary instructions, activity restrictions, and follow up appointments.   Follow-Up Appointments: Follow-up Information    [facility] Follow up.   Specialty:  Home Health Services Why:  For home health nursing, IV steroids, and tub bench. Tub bench will be delivered to room prior to DC.  Contact information: [address] [place] [address] [phone]           Future Appointments  Date Time Provider Department Center  07/13/2018  9:10 AM ACCESS TO CARE POOL [facility]  07/22/2018  1:00 PM Sater, Pearletha Furl, MD [facility] None     Melene Plan, MD 07/11/2018, 11:50 AM PGY-1, [facility] Family  Medicine

## 2018-07-10 NOTE — Consult Note (Addendum)
Neurology Consultation Reason for Consult: Optic neuritis Referring Physician: Rubin Payor, N  CC: Optic neuritis  History is obtained from: Patient  HPI: Alicia Barker is a 32 y.o. female with a history of multiple sclerosis who is currently taking Ocrevus for secondary prevention and received her last infusion about 6 months ago.  She was in her normal state of health until yesterday when she noticed that her left eye was getting progressively darker and it seemed like there was a shade going down over it.  Over the course of the day she has lost most of the vision in the eye.  An MRI was obtained which demonstrates increased T2 signal in the left optic nerve consistent with demyelination.     ROS: A 14 point ROS was performed and is negative except as noted in the HPI.  Past Medical History:  Diagnosis Date  . Depression    no meds currently  . MS (multiple sclerosis) (HCC)   . SVD (spontaneous vaginal delivery)    x 5     Family History  Problem Relation Age of Onset  . Diabetes Mother   . Heart disease Mother   . Diabetes Father   . Multiple sclerosis Cousin      Social History:  reports that she has quit smoking. Her smoking use included cigarettes. She smoked 0.10 packs per day. She has never used smokeless tobacco. She reports that she does not drink alcohol or use drugs.   Exam: Current vital signs: BP 101/65 (BP Location: Left Arm)   Pulse 72   Temp 98.3 F (36.8 C) (Oral)   Resp 18   Ht 5\' 4"  (1.626 m)   Wt 89.8 kg   LMP 06/25/2018 (Exact Date)   SpO2 100%   BMI 33.99 kg/m  Vital signs in last 24 hours: Temp:  [98.3 F (36.8 C)] 98.3 F (36.8 C) (11/29 1345) Pulse Rate:  [72-84] 72 (11/29 1345) Resp:  [18] 18 (11/29 1345) BP: (101-118)/(65-79) 101/65 (11/29 1345) SpO2:  [99 %-100 %] 100 % (11/29 1345) Weight:  [89.8 kg] 89.8 kg (11/29 0715)   Physical Exam  Constitutional: Appears well-developed and well-nourished.  Psych: Affect appropriate  to situation Eyes: No scleral injection HENT: No OP obstrucion Head: Normocephalic.  Cardiovascular: Normal rate and regular rhythm.  Respiratory: Effort normal, non-labored breathing GI: Soft.  No distension. There is no tenderness.  Skin: WDI  Neuro: Mental Status: Patient is awake, alert, oriented to person, place, month, year, and situation. Patient is able to give a clear and coherent history. No signs of aphasia or neglect Cranial Nerves: II: Visual Fields are full in the right eye, she has light perception in the left eye. Pupils are equal, round, there is a afferent pupillary defect in the left, though there is some reactivity still. III,IV, VI: EOMI without ptosis or diploplia.  V: Facial sensation is symmetric to temperature VII: Facial movement with mild left facial weakness VIII: hearing is intact to voice X: Uvula elevates symmetrically XI: Shoulder shrug is symmetric. XII: tongue is midline without atrophy or fasciculations.  Motor: Tone is normal. Bulk is normal.  She has 4+/5 strength of the left arm and leg with mild drift. Sensation is symmetric to light touch and temperature in the arms and legs. Cerebellar: FNF consistent with weakness in the left arm, intact on the right  I have reviewed labs in epic and the results pertinent to this consultation are: CMP-unremarkable  I have reviewed the images obtained:  MRI brain-increased T2 signal in the left optic nerve  Impression: 32 year old female with a history of multiple sclerosis who presents with left optic neuritis.  This is slightly faster after neuritis, but given the imaging findings and her history of multiple sclerosis I would favor going ahead and starting IV Solu-Medrol.  Recommendations: 1) Solu-Medrol 500 mg twice daily for 5 days, if outpatient infusions could be obtained, then she could receive 1 g doses daily 2) urinalysis to check for other possible contributors to flare 3) I would recommend PPI  for GI protection while on high-dose steroids 4) consider monitoring blood sugar.  Ritta Slot, MD [facility] [phone]  If 7pm- 7am, please page neurology on call as listed in AMION.

## 2018-07-10 NOTE — ED Provider Notes (Signed)
[facility] EMERGENCY DEPARTMENT Provider Note   CSN: [id number] Arrival date & time: 07/10/18  7829     History   Chief Complaint Chief Complaint  Patient presents with  . Eye Problem    HPI Alicia Barker is a 32 y.o. female.  HPI Patient presents with vision change.  Last normal vision was 2 days ago.  Yesterday began to have blurred vision.  States it started coming from the top down with it being darker.  Now only see some shapes.  History of MS and has had vision problems with it in the past.  No headache currently but has had some headaches over the last few weeks.  She is on Ocrevus with her last dose in September. She sees Dr Epimenio Foot. Past Medical History:  Diagnosis Date  . Depression    no meds currently  . MS (multiple sclerosis) (HCC)   . SVD (spontaneous vaginal delivery)    x 5    Patient Active Problem List   Diagnosis Date Noted  . Stress incontinence 07/25/2017  . Neck pain 03/12/2017  . Acute upper back pain 12/23/2016  . Gait disturbance 06/25/2016  . Dysuria 04/10/2016  . Homeless family 04/10/2016  . Healthcare maintenance 04/10/2016  . Depression 03/28/2016  . Multiple sclerosis exacerbation (HCC) 02/22/2016  . Weakness   . Diplopia   . Left hip pain 01/18/2016  . Left sided sciatica 01/18/2016  . Numbness 11/28/2015  . Ataxia 11/28/2015  . Visual disturbance 11/28/2015  . Other fatigue 11/28/2015  . Urinary frequency 11/28/2015  . High risk medication use 11/28/2015  . Chest pain 11/25/2015  . 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 Dizziness   . Multiple sclerosis (HCC) 11/15/2015    Past Surgical History:  Procedure Laterality Date  . LAPAROSCOPIC TUBAL LIGATION Bilateral 01/06/2013   Procedure: LAPAROSCOPIC TUBAL LIGATION;  Surgeon: Kathreen Cosier, MD;  Location: [facility];  Service: Gynecology;  Laterality: Bilateral;  . right hand surgery       OB History    Gravida  5   Para  5   Term  4   Preterm   1   AB  0   Living  5     SAB  0   TAB  0   Ectopic  0   Multiple  0   Live Births  5            Home Medications    Prior to Admission medications   Medication Sig Start Date End Date Taking? Authorizing Provider  acetaminophen-codeine (TYLENOL #3) 300-30 MG tablet Take 1 tablet by mouth 3 (three) times daily. 06/01/18   Sater, Pearletha Furl, MD  amphetamine-dextroamphetamine (ADDERALL) 10 MG tablet Take 1 tablet (10 mg total) by mouth 2 (two) times daily. 06/01/18   Sater, Pearletha Furl, MD  cholecalciferol (VITAMIN D) 1000 units tablet Take 5,000 Units by mouth daily.    [provider]  cyclobenzaprine (FLEXERIL) 5 MG tablet TAKE 1 TABLET BY MOUTH EVERY NIGHT AT BEDTIME. TAKE UP TO THREE TIMES DAILY 03/20/18   Sater, Pearletha Furl, MD  meclizine (ANTIVERT) 25 MG tablet Take 1 tablet (25 mg total) by mouth 3 (three) times daily as needed for dizziness. 03/01/16   Sater, Pearletha Furl, MD  ondansetron (ZOFRAN) 4 MG tablet Take 1 tablet (4 mg total) by mouth as needed for nausea or vomiting. 03/28/18   Rennis Harding, PA-C    Family History Family History  Problem Relation Age of Onset  . Diabetes Mother   . Heart disease Mother   . Diabetes Father   . Multiple sclerosis Cousin     Social History Social History   Tobacco Use  . Smoking status: Former Smoker    Packs/day: 0.10    Types: Cigarettes  . Smokeless tobacco: Never Used  Substance Use Topics  . Alcohol use: No    Alcohol/week: 0.0 standard drinks  . Drug use: No     Allergies   Penicillins   Review of Systems Review of Systems  Constitutional: Negative for appetite change.  HENT: Negative for congestion.   Eyes: Positive for visual disturbance. Negative for pain.  Respiratory: Negative for shortness of breath.   Gastrointestinal: Negative for abdominal pain.  Genitourinary: Negative for flank pain.  Musculoskeletal: Negative for back pain.  Skin: Negative for rash.  Neurological: Positive for  weakness and headaches.  Psychiatric/Behavioral: Negative for confusion.     Physical Exam Updated Vital Signs BP 111/79   Pulse 84   Temp 98.3 F (36.8 C)   Resp 18   Ht 5\' 4"  (1.626 m)   Wt 89.8 kg   LMP 06/25/2018 (Exact Date)   SpO2 99%   BMI 33.99 kg/m   Physical Exam  Constitutional: She is oriented to person, place, and time. She appears well-developed.  HENT:  Head: Atraumatic.  Eyes: Pupils are equal, round, and reactive to light. EOM are normal.  Normal vision in her right eye.  Left eye vision is only shapes and some movement.  Cannot count fingers.  Neck: Neck supple.  Cardiovascular: Normal rate.  Pulmonary/Chest: She has no rales.  Abdominal: There is no tenderness.  Musculoskeletal: Normal range of motion.  Neurological: She is alert and oriented to person, place, and time.  Decreased vision in left eye.  Otherwise moving all extremities.  Skin: Skin is warm. Capillary refill takes less than 2 seconds.     ED Treatments / Results  Labs (all labs ordered are listed, but only abnormal results are displayed) Labs Reviewed  CBC WITH DIFFERENTIAL/PLATELET - Abnormal; Notable for the following components:      Result Value   Hemoglobin 11.0 (*)    HCT 35.1 (*)    All other components within normal limits  COMPREHENSIVE METABOLIC PANEL - Abnormal; Notable for the following components:   Total Protein 6.2 (*)    Albumin 3.3 (*)    All other components within normal limits  I-STAT BETA HCG BLOOD, ED ([facility], [facility], [facility] ONLY)    EKG None  Radiology Mr Laqueta Jean And Wo Contrast  Result Date: 07/10/2018 CLINICAL DATA:  Multiple sclerosis with vision loss. Left eye visual loss. EXAM: MRI HEAD WITHOUT AND WITH CONTRAST TECHNIQUE: Multiplanar, multiecho pulse sequences of the brain and surrounding structures were obtained without and with intravenous contrast. CONTRAST:  9 mL Gadavist COMPARISON:  MRI brain 03/25/2016 FINDINGS: Brain: Restricted diffusion is evident  within the left optic nerve. Associated T2 signal changes are best seen on the FLAIR images. A subcortical lesion in the right frontal lobe on image 91 of series 16 and 39 of series 15 has increased in size since the prior exam. There is no associated restricted diffusion with this lesion. There is disc scratched at there is decreased conspicuity of lesions in the anterior inferior left frontal lobe and right parietal lobe. Brainstem lesions are less prominent on today's study. No other new lesions are present. No hemorrhage or mass lesion is  present. The ventricles are of normal size. No significant extra-axial fluid collection is present. Vascular: Flow is present in the major intracranial arteries. Skull and upper cervical spine: The craniocervical junction is normal. Decreased marrow signal is present in the upper cervical spine. Sinuses/Orbits: Mild mucosal thickening, left greater than right is present in the ethmoid air cells bilaterally. The paranasal sinuses and mastoid air cells are otherwise clear. IMPRESSION: 1. New increased T2 signal and restricted diffusion in the left optic nerve is most consistent with active demyelination in this patient with known multiple sclerosis. This corresponds with patient's decreased left visual acuity. 2. Increased size of subcortical T2 lesion in the posterior right frontal lobe, also consistent with demyelination. 3. Multiple other lesions are decreased in size compared to the prior study. Findings are typical of waxing and waning multiple sclerosis. Electronically Signed   By: Marin Roberts M.D.   On: 07/10/2018 10:05    Procedures Procedures (including critical care time)  Medications Ordered in ED Medications  LORazepam (ATIVAN) injection 0.5 mg (0.5 mg Intravenous Given 07/10/18 0822)  gadobutrol (GADAVIST) 1 MMOL/ML injection 9 mL (9 mLs Intravenous Contrast Given 07/10/18 0942)     Initial Impression / Assessment and Plan / ED Course  I have  reviewed the triage vital signs and the nursing notes.  Pertinent labs & imaging results that were available during my care of the patient were reviewed by me and considered in my medical decision making (see chart for details).    Patient with history of MS.  Has vision change over the last 2 to 3 days.  MS exacerbation on left ocular nerve on MRI.  Discussed with neurology.  Will need 5 days of high-dose steroids.  Will admit to family practice to start this.  Patient will be seen by neurology.  Final Clinical Impressions(s) / ED Diagnoses   Final diagnoses:  Multiple sclerosis exacerbation [facility])    ED Discharge Orders    None       Benjiman Core, MD 07/10/18 1017

## 2018-07-10 NOTE — ED Notes (Signed)
Patient transported to MRI 

## 2018-07-11 DIAGNOSIS — G35 Multiple sclerosis: Secondary | ICD-10-CM | POA: Diagnosis present

## 2018-07-11 DIAGNOSIS — H469 Unspecified optic neuritis: Secondary | ICD-10-CM

## 2018-07-11 DIAGNOSIS — F902 Attention-deficit hyperactivity disorder, combined type: Secondary | ICD-10-CM

## 2018-07-11 LAB — CBC
HCT: 36.3 % (ref 36.0–46.0)
Hemoglobin: 11.5 g/dL — ABNORMAL LOW (ref 12.0–15.0)
MCH: 27.8 pg (ref 26.0–34.0)
MCHC: 31.7 g/dL (ref 30.0–36.0)
MCV: 87.9 fL (ref 80.0–100.0)
Platelets: 279 10*3/uL (ref 150–400)
RBC: 4.13 MIL/uL (ref 3.87–5.11)
RDW: 13.9 % (ref 11.5–15.5)
WBC: 17.4 10*3/uL — ABNORMAL HIGH (ref 4.0–10.5)
nRBC: 0 % (ref 0.0–0.2)

## 2018-07-11 LAB — BASIC METABOLIC PANEL
Anion gap: 10 (ref 5–15)
BUN: 11 mg/dL (ref 6–20)
CALCIUM: 9.6 mg/dL (ref 8.9–10.3)
CO2: 21 mmol/L — ABNORMAL LOW (ref 22–32)
Chloride: 103 mmol/L (ref 98–111)
Creatinine, Ser: 0.78 mg/dL (ref 0.44–1.00)
GFR calc Af Amer: >60 mL/min (ref >60)
GFR calc non Af Amer: >60 mL/min (ref >60)
Glucose, Bld: 185 mg/dL — ABNORMAL HIGH (ref 70–99)
Potassium: 4.2 mmol/L (ref 3.5–5.1)
Sodium: 134 mmol/L — ABNORMAL LOW (ref 135–145)

## 2018-07-11 MED ORDER — SODIUM CHLORIDE 0.9 % IV SOLN: 1000.0000 mg | Freq: Every day | INTRAVENOUS | 0 refills | Status: DC

## 2018-07-11 MED ORDER — SODIUM CHLORIDE 0.9 % IV SOLN
1000.0000 mg | Freq: Every day | INTRAVENOUS | Status: AC
  Administered 2018-07-11 – 2018-07-14 (×4): 1000 mg via INTRAVENOUS
  Filled 2018-07-11 (×5): qty 8

## 2018-07-11 MED ORDER — PANTOPRAZOLE SODIUM 40 MG PO TBEC: 40.0000 mg | DELAYED_RELEASE_TABLET | Freq: Every day | ORAL | 0 refills | Status: DC

## 2018-07-11 NOTE — Progress Notes (Signed)
FPTS Interim Progress Note:   Patient was to be discharged today. However upon nurse discussion of discharge instructions with self-administration of IV steroids, patient did not feel comfortable doing this at home. Patient to remain inpatient with continued IV solu-medrol per neurology recommendations for MS exacerbation. Patient is stable with continued left-eye vision less today, no other complaints. Please see Dr. Elmyra Ricks note this morning, 11/30, for further information.   Allayne Stack, DO  Family Medicine PGY-1

## 2018-07-11 NOTE — Progress Notes (Signed)
Informed by bedside RN that patient is declining HH IV steroids, lack of support at home. notified [facility] patient will not DC

## 2018-07-11 NOTE — Care Management Note (Signed)
Case Management Note  Patient Details  Name: Alicia Barker MRN: [id number] Date of Birth: Dec 01, 1985  Subjective/Objective:                 MS flare   Action/Plan:  Spoke to patient at bedside. She states she would like to go home to have her IV steroids finished. She states that she has support from her husband for infusions. Verified w Jeri Modena [facility] home infusions coordinator that patient can DC with PIV for short term administration of IV steroids. Confirmed orders required for set up. Patient would like to use [facility] services as well. Referral placed to [facility] for IV meds and HH services.  Patient will receive dose today in hospital and start home care tomorrow.   MD please print script for solumedrol to nurses station, and place [facility] order for RN with line care per [facility] protocol.   Expected Discharge Date:  07/15/18               Expected Discharge Plan:  Home w Home Health Services  In-House Referral:     Discharge planning Services  CM Consult  Post Acute Care Choice:  Home Health, Durable Medical Equipment Choice offered to:  Patient  DME Arranged:  Tub bench DME Agency:  [facility]  HH Arranged:  RN [facility] Agency:  [facility]  Status of Service:  Completed, signed off  If discussed at Long Length of Stay Meetings, dates discussed:    Additional Comments:  Lawerance Sabal, RN 07/11/2018, 9:23 AM

## 2018-07-11 NOTE — Progress Notes (Addendum)
NEURO HOSPITALIST PROGRESS NOTE   Subjective: Patient awake, alert, in bed, NAD. No improvement in vision of left eye. Patient states it is dark/black. She can see light when it is up close. Described as like looking through a black stain glass window.  Exam: Vitals:   07/10/18 2117 07/11/18 0516  BP: (!) 103/57 114/67  Pulse: 75 82  Resp:    Temp: 98.2 F (36.8 C) 98.7 F (37.1 C)  SpO2: 100% 100%    Physical Exam   HEENT-  Normocephalic, no lesions, without obvious abnormality.  Normal external eye and conjunctiva.   Cardiovascular- S1-S2 audible, pulses palpable throughout   Lungs-no rhonchi or wheezing noted, no excessive working breathing.  Saturations within normal limits on RA Abdomen- All 4 quadrants palpated and nontender Extremities- Warm, dry and intact Musculoskeletal-no joint tenderness, deformity or swelling Skin-warm and dry, no hyperpigmentation, vitiligo, or suspicious lesions   Neuro:  Mental Status: Patient is awake, alert, oriented to person, place, month, year, and situation. Patient is able to give a clear and coherent history. No signs of aphasia or neglect Cranial Nerves: II: Visual Fields are full in the right eye, she has light perception in the left eye. Pupils are equal, round, there is a afferent pupillary defect in the left, though there is some reactivity still. III,IV, VI: EOMI without ptosis or diploplia.  V: Facial sensation is symmetric to temperature VII: Facial movement with mild left facial weakness VIII: hearing is intact to voice X: Uvula elevates symmetrically XI: Shoulder shrug is symmetric. XII: tongue is midline without atrophy or fasciculations.  Motor: Tone is normal. Bulk is normal.  She has 4+/5 strength of the left arm and leg with mild drift. Sensory: light touch,  Intact throughout. Cerebellar: FNF ataxia with left arm, noted to be consistent with weakness. FNF intact on right.    Medications:   Scheduled: . enoxaparin (LOVENOX) injection  40 mg Subcutaneous Q24H  . pantoprazole  40 mg Oral Daily   Continuous: . methylPREDNISolone (SOLU-MEDROL) injection 108 mL/hr at 07/10/18 1459   ZOX:[id number], amphetamine-dextroamphetamine, ibuprofen  Pertinent Labs/Diagnostics: U/A: WNL  Mr Brain W And Wo Contrast  Result Date: 07/10/2018 CLINICAL DATA:  Multiple sclerosis with vision loss. Left eye visual loss. EXAM: MRI HEAD WITHOUT AND WITH CONTRAST TECHNIQUE: Multiplanar, multiecho pulse sequences of the brain and surrounding structures were obtained without and with intravenous contrast. CONTRAST:  9 mL Gadavist COMPARISON:  MRI brain 03/25/2016 FINDINGS: Brain: Restricted diffusion is evident within the left optic nerve. Associated T2 signal changes are best seen on the FLAIR images. A subcortical lesion in the right frontal lobe on image 91 of series 16 and 39 of series 15 has increased in size since the prior exam. There is no associated restricted diffusion with this lesion. There is disc scratched at there is decreased conspicuity of lesions in the anterior inferior left frontal lobe and right parietal lobe. Brainstem lesions are less prominent on today's study. No other new lesions are present. No hemorrhage or mass lesion is present. The ventricles are of normal size. No significant extra-axial fluid collection is present. Vascular: Flow is present in the major intracranial arteries. Skull and upper cervical spine: The craniocervical junction is normal. Decreased marrow signal is present in the upper cervical spine. Sinuses/Orbits: Mild mucosal thickening, left greater than right is present in the ethmoid air cells bilaterally. The  paranasal sinuses and mastoid air cells are otherwise clear. IMPRESSION: 1. New increased T2 signal and restricted diffusion in the left optic nerve is most consistent with active demyelination in this patient with known multiple sclerosis. This corresponds  with patient's decreased left visual acuity. 2. Increased size of subcortical T2 lesion in the posterior right frontal lobe, also consistent with demyelination. 3. Multiple other lesions are decreased in size compared to the prior study. Findings are typical of waxing and waning multiple sclerosis. Electronically Signed   By: Marin Roberts M.D.   On: 07/10/2018 10:05   Assessment:  32 year old female with a history of multiple sclerosis who presents with left optic neuritis.  This is slightly faster after neuritis, but given the imaging findings and her history of multiple sclerosis I would favor going ahead and starting IV Solu-Medrol.   Recommendations:  -- Solu-Medrol 1g IV once daily for 5 days ( day 2/5) -- I would recommend PPI for GI protection while on high-dose steroids -- consider monitoring blood sugar -neurology to continue to follow  Valentina Lucks, MSN, NP-C [facility] [phone]  07/11/2018, 7:11 AM   I have reviewed this note.  Ritta Slot, MD [facility] [phone]  If 7pm- 7am, please page neurology on call as listed in AMION.

## 2018-07-12 DIAGNOSIS — H469 Unspecified optic neuritis: Secondary | ICD-10-CM

## 2018-07-12 DIAGNOSIS — D649 Anemia, unspecified: Secondary | ICD-10-CM

## 2018-07-12 LAB — BASIC METABOLIC PANEL
Anion gap: 10 (ref 5–15)
BUN: 12 mg/dL (ref 6–20)
CHLORIDE: 104 mmol/L (ref 98–111)
CO2: 23 mmol/L (ref 22–32)
Calcium: 8.9 mg/dL (ref 8.9–10.3)
Creatinine, Ser: 0.79 mg/dL (ref 0.44–1.00)
GFR calc Af Amer: >60 mL/min (ref >60)
GFR calc non Af Amer: >60 mL/min (ref >60)
GLUCOSE: 135 mg/dL — AB (ref 70–99)
Potassium: 4.2 mmol/L (ref 3.5–5.1)
Sodium: 137 mmol/L (ref 135–145)

## 2018-07-12 LAB — HIV ANTIBODY (ROUTINE TESTING W REFLEX): HIV Screen 4th Generation wRfx: NONREACTIVE

## 2018-07-12 NOTE — Progress Notes (Addendum)
NEURO HOSPITALIST PROGRESS NOTE   Subjective: Patient asleep in bed. Vision has improved today.  Left eye inner nasal portion patient can see her hand. If examiner stands in that field she can see examiner and see the white coat. No other color perception at this time. The rest of her eye remains with light perception only. As if "she is looking through a black stain glass window".  Exam: Vitals:   07/11/18 1423 07/11/18 2033  BP: 121/79 109/72  Pulse: 84 66  Resp: 20   Temp: 98.7 F (37.1 C) 98.6 F (37 C)  SpO2: 99% 100%    Physical Exam   HEENT-  Normocephalic, no lesions, without obvious abnormality.  Normal external eye and conjunctiva.   Cardiovascular- S1-S2 audible, pulses palpable throughout   Lungs-no rhonchi or wheezing noted, no excessive working breathing.  Saturations within normal limits on RA Abdomen- All 4 quadrants palpated and nontender Extremities- Warm, dry and intact Musculoskeletal-no joint tenderness, deformity or swelling Skin-warm and dry, no hyperpigmentation, vitiligo, or suspicious lesions   Neuro:  Mental Status: Patient is awake, alert, oriented to person, place, month, year, and situation. Patient is able to give a clear and coherent history. No signs of aphasia or neglect Cranial Nerves: II: Visual Fields are full in the right eye, she has light perception in the left eye. Pupils are equal, round, there is a afferent pupillary defect in the left, though there is some reactivity still. III,IV, VI: EOMI without ptosis or diploplia.  V: Facial sensation is symmetric to temperature VII: Facial movement with mild left facial weakness VIII: hearing is intact to voice X: Uvula elevates symmetrically XI: Shoulder shrug is symmetric. XII: tongue is midline without atrophy or fasciculations.  Motor: Tone is normal. Bulk is normal.  She has 4+/5 strength of the left arm and leg with mild drift. Sensory: light touch,  Intact  throughout. Cerebellar: FNF ataxia with left arm, noted to be consistent with weakness. FNF intact on right.    Medications:  Scheduled: . enoxaparin (LOVENOX) injection  40 mg Subcutaneous Q24H  . pantoprazole  40 mg Oral Daily   Continuous: . methylPREDNISolone (SOLU-MEDROL) injection 1,000 mg (07/11/18 0926)   QMV:[id number], amphetamine-dextroamphetamine, ibuprofen  Pertinent Labs/Diagnostics: U/A: WNL  Mr Brain W And Wo Contrast  Result Date: 07/10/2018 CLINICAL DATA:  Multiple sclerosis with vision loss. Left eye visual loss. EXAM: MRI HEAD WITHOUT AND WITH CONTRAST TECHNIQUE: Multiplanar, multiecho pulse sequences of the brain and surrounding structures were obtained without and with intravenous contrast. CONTRAST:  9 mL Gadavist COMPARISON:  MRI brain 03/25/2016 FINDINGS: Brain: Restricted diffusion is evident within the left optic nerve. Associated T2 signal changes are best seen on the FLAIR images. A subcortical lesion in the right frontal lobe on image 91 of series 16 and 39 of series 15 has increased in size since the prior exam. There is no associated restricted diffusion with this lesion. There is disc scratched at there is decreased conspicuity of lesions in the anterior inferior left frontal lobe and right parietal lobe. Brainstem lesions are less prominent on today's study. No other new lesions are present. No hemorrhage or mass lesion is present. The ventricles are of normal size. No significant extra-axial fluid collection is present. Vascular: Flow is present in the major intracranial arteries. Skull and upper cervical spine: The craniocervical junction is normal. Decreased marrow signal is  present in the upper cervical spine. Sinuses/Orbits: Mild mucosal thickening, left greater than right is present in the ethmoid air cells bilaterally. The paranasal sinuses and mastoid air cells are otherwise clear. IMPRESSION: 1. New increased T2 signal and restricted diffusion in the  left optic nerve is most consistent with active demyelination in this patient with known multiple sclerosis. This corresponds with patient's decreased left visual acuity. 2. Increased size of subcortical T2 lesion in the posterior right frontal lobe, also consistent with demyelination. 3. Multiple other lesions are decreased in size compared to the prior study. Findings are typical of waxing and waning multiple sclerosis. Electronically Signed   By: Marin Roberts M.D.   On: 07/10/2018 10:05   Assessment:  32 year old female with a history of multiple sclerosis who presents with left optic neuritis.  This is slightly faster after neuritis, but given the imaging findings and her history of multiple sclerosis I would favor going ahead and starting IV Solu-Medrol.   Recommendations:  -- Solu-Medrol 1g IV once daily for 5 days ( day 3/5) -- I would recommend PPI for GI protection while on high-dose steroids -- consider monitoring blood sugar -neurology to continue to follow  Valentina Lucks, MSN, NP-C [facility] [phone]  07/12/2018, 7:17 AM   I have seen the patient reviewed the above note.  She is starting to have some improvement in her vision, hopefully this will continue to improve.  On my exam, she has a little bit of medial vision in the  Left eye, still with a prominent afferent pupillary defect.  Continue steroids.  Ritta Slot, MD [facility] [phone]  If 7pm- 7am, please page neurology on call as listed in AMION.

## 2018-07-12 NOTE — Progress Notes (Signed)
Family Medicine Teaching Service Daily Progress Note Intern Pager: [phone]  Patient name: Alicia Barker Medical record number: [id number] Date of birth: 1986-08-10 Age: 32 y.o. Gender: female  Primary Care Provider: Wendee Beavers, DO Consultants: Neurology Code Status: Full  Pt Overview and Major Events to Date:  11/29 - admitted for L eye vision changes  Assessment and Plan: Alicia Barker is a 32 y.o. female presenting with left-eye vision changes. PMH is significant for MS.   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 Pain persistent. Brain MRI on admission showing active demyelination to the left optic nerve, corresponding with patient's decreased left visual acuity.   - Neuro following, appreciate recs - continues on IV Methylprednisolone 1g x 5 days (11/29 - 12/3) - Tylenol/ibuprofen as needed pain/headache - PT/OT  Anemia: Baseline ~10.8-12.6. Hgb stable at 11.5.  - Monitor  Muscle spasms: patient previously prescribed Flexeril for occasional back spasm. No complaints today. - Hold Flexeril - Monitor  Attention deficit Disorder: patient takes Adderall 10mg  BID PRN.  - Continue Adderall   FEN/GI: regular diet Prophylaxis: Lovenox  Disposition: continue inpatient management of MS exacerbation  Subjective:  States vision has somewhat improved, pain persistent. States husband will not be home for 3 more days and she does not feel comfortable administering the IV steroids herself even with instructions from nursing.  Objective: Temp:  [98.6 F (37 C)-98.7 F (37.1 C)] 98.6 F (37 C) (11/30 2033) Pulse Rate:  [66-84] 66 (11/30 2033) Resp:  [20] 20 (11/30 1423) BP: (109-121)/(72-79) 109/72 (11/30 2033) SpO2:  [99 %-100 %] 100 % (11/30 2033) Physical Exam: General: pleasant female lying in  bed, in NAD Cardiovascular: RRR, no murmur Respiratory: CTAB Abdomen: soft, NTND, +BS Extremities: warm and well perfused. No edema. Neuro: Alert and oriented, speech normal. L optic field decreased. PERRL, Extraocular movements intact.  Intact symmetric sensation to light touch of face and extremities bilaterally.  Hearing grossly intact bilaterally.  Tongue protrudes normally with no deviation.  Shoulder shrug, smile symmetric.  Laboratory: Recent Labs  Lab 07/10/18 0733 07/11/18 0607  WBC 5.9 17.4*  HGB 11.0* 11.5*  HCT 35.1* 36.3  PLT 232 279   Recent Labs  Lab 07/10/18 0733 07/11/18 0607  NA 137 134*  K 3.8 4.2  CL 107 103  CO2 23 21*  BUN 10 11  CREATININE 0.81 0.78  CALCIUM 9.0 9.6  PROT 6.2*  --   BILITOT 0.5  --   ALKPHOS 55  --   ALT 23  --   AST 19  --   GLUCOSE 96 185*    Imaging/Diagnostic Tests: No results found.  Ellwood Dense, DO 07/12/2018, 6:50 AM PGY-2, [facility] FPTS Intern pager: [phone], text pages welcome

## 2018-07-13 ENCOUNTER — Inpatient Hospital Stay: Payer: Medicaid Other

## 2018-07-13 LAB — BASIC METABOLIC PANEL
ANION GAP: 4 — AB (ref 5–15)
BUN: 12 mg/dL (ref 6–20)
CALCIUM: 8.6 mg/dL — AB (ref 8.9–10.3)
CO2: 29 mmol/L (ref 22–32)
Chloride: 104 mmol/L (ref 98–111)
Creatinine, Ser: 0.71 mg/dL (ref 0.44–1.00)
GFR calc Af Amer: >60 mL/min (ref >60)
GFR calc non Af Amer: >60 mL/min (ref >60)
Glucose, Bld: 143 mg/dL — ABNORMAL HIGH (ref 70–99)
POTASSIUM: 3.8 mmol/L (ref 3.5–5.1)
Sodium: 137 mmol/L (ref 135–145)

## 2018-07-13 NOTE — Evaluation (Signed)
Physical Therapy Evaluation Patient Details Name: Alicia Barker MRN: [id number] DOB: 15-Apr-1986 Today's Date: 07/13/2018   History of Present Illness  32 y.o. female with a history of multiple sclerosis who presents with left optic neuritis.   Clinical Impression  Patient admitted with the above listed diagnosis. Patient reporting that prior to admission she was independent with all aspects of mobility. Patient today requiring up to Min A for gait/mobility due to L visual deficits with difficulty navigating environment without external support. Will continue to follow acutely to maximize safe and independent functional mobility prior d/c to the venue listed below.     Follow Up Recommendations Home health PT;Supervision - Intermittent    Equipment Recommendations  (will continue to assess)    Recommendations for Other Services OT consult(as ordered)     Precautions / Restrictions Precautions Precautions: Fall Restrictions Weight Bearing Restrictions: No      Mobility  Bed Mobility Overal bed mobility: Independent                Transfers Overall transfer level: Needs assistance Equipment used: 1 person hand held assist Transfers: Sit to/from Stand;Stand Pivot Transfers Sit to Stand: Supervision Stand pivot transfers: Min assist       General transfer comment: LOB with turn toward L; unsteady with turning/directional changes  Ambulation/Gait Ambulation/Gait assistance: Min assist Gait Distance (Feet): 120 Feet Assistive device: 1 person hand held assist Gait Pattern/deviations: Step-through pattern;Decreased stride length;Drifts right/left Gait velocity: decreased   General Gait Details: very cautious - reaches arms out to touch surroundings  Stairs            Wheelchair Mobility    Modified Rankin (Stroke Patients Only)       Balance Overall balance assessment: Needs assistance   Sitting balance-Leahy Scale: Normal       Standing  balance-Leahy Scale: Poor Standing balance comment: with dynamic tasks; falls toward L                             Pertinent Vitals/Pain Pain Assessment: Faces Faces Pain Scale: Hurts a little bit Pain Location: around L eye Pain Descriptors / Indicators: Discomfort Pain Intervention(s): Limited activity within patient's tolerance;Monitored during session    Home Living Family/patient expects to be discharged to:: Private residence Living Arrangements: Spouse/significant other;Children Available Help at Discharge: Family;Available PRN/intermittently Type of Home: Apartment Home Access: Stairs to enter Entrance Stairs-Rails: Left Entrance Stairs-Number of Steps: flight @ 18 Home Layout: Two level;Bed/bath upstairs Home Equipment: Cane - single point(has tub bench in her room)      Prior Function Level of Independence: Independent         Comments: works as Lawyer; 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     Hand Dominance   Dominant Hand: Right    Extremity/Trunk Assessment   Upper Extremity Assessment Upper Extremity Assessment: Defer to OT evaluation    Lower Extremity Assessment Lower Extremity Assessment: Overall WFL for tasks assessed    Cervical / Trunk Assessment Cervical / Trunk Assessment: Normal  Communication   Communication: No difficulties  Cognition Arousal/Alertness: Awake/alert Behavior During Therapy: WFL for tasks assessed/performed Overall Cognitive Status: Within Functional Limits for tasks assessed                                        General Comments  Exercises     Assessment/Plan    PT Assessment Patient needs continued PT services  PT Problem List Decreased strength;Decreased activity tolerance;Decreased balance;Decreased mobility;Decreased knowledge of use of DME       PT Treatment Interventions DME instruction;Gait training;Stair training;Functional mobility training;Therapeutic activities;Therapeutic  exercise;Balance training;Patient/family education    PT Goals (Current goals can be found in the Care Plan section)  Acute Rehab PT Goals Patient Stated Goal: to have her vision return PT Goal Formulation: With patient Time For Goal Achievement: 07/27/18 Potential to Achieve Goals: Good    Frequency Min 3X/week   Barriers to discharge        Co-evaluation               AM-PAC PT "6 Clicks" Mobility  Outcome Measure Help needed turning from your back to your side while in a flat bed without using bedrails?: None Help needed moving from lying on your back to sitting on the side of a flat bed without using bedrails?: A Little Help needed moving to and from a bed to a chair (including a wheelchair)?: A Little Help needed standing up from a chair using your arms (e.g., wheelchair or bedside chair)?: A Little Help needed to walk in hospital room?: A Little Help needed climbing 3-5 steps with a railing? : A Little 6 Click Score: 19    End of Session Equipment Utilized During Treatment: Gait belt Activity Tolerance: Patient tolerated treatment well Patient left: in bed;with call bell/phone within reach;with nursing/sitter in room Nurse Communication: Mobility status PT Visit Diagnosis: Unsteadiness on feet (R26.81);Other abnormalities of gait and mobility (R26.89)    Time: 1610-9604 PT Time Calculation (min) (ACUTE ONLY): 12 min   Charges:   PT Evaluation $PT Eval Low Complexity: 1 Low          Kipp Laurence, PT, DPT Supplemental Physical Therapist 07/13/18 1:19 PM Pager: [phone] Office: [phone]

## 2018-07-13 NOTE — Progress Notes (Addendum)
Subjective: Patient today states that she still has no vision in the left eye.  She is having a headache around her left eye at this point time.  She has received a total of 3 doses with today being a fourth dose of Solu-Medrol.  States that she has had no improvement.  Exam: Vitals:   07/11/18 2033 07/12/18 1701  BP: 109/72 118/73  Pulse: 66 60  Resp:    Temp: 98.6 F (37 C) 98.8 F (37.1 C)  SpO2: 100% 98%    Physical Exam   HEENT-  Normocephalic, no lesions, without obvious abnormality.  Normal external eye and conjunctiva.   Extremities- Warm, dry and intact Musculoskeletal-no joint tenderness, deformity or swelling Skin-warm and dry, no hyperpigmentation, vitiligo, or suspicious lesions    Neuro:  Mental Status: Alert, oriented, thought content appropriate.  Speech fluent without evidence of aphasia.  Able to follow 3 step commands without difficulty. Cranial Nerves: II: Right eye has full vision.  Left eye has mild nasal vision otherwise no vision at all.  No nystagmus with OKN strip III,IV, VI: ptosis not present, extra-ocular motions intact bilaterally pupils equal, round, reactive to light and accommodation with mild left relative afferent pupillary defect V,VII: smile symmetric, facial light touch sensation normal bilaterally VIII: hearing normal bilaterally IX,X: uvula rises midline XI: bilateral shoulder shrug XII: midline tongue extension Motor: Right : Upper extremity   5/5    Left:     Upper extremity   5/5  Lower extremity   5/5     Lower extremity   5/5 Tone and bulk:normal tone throughout; no atrophy noted Sensory: Pinprick and light touch intact throughout, bilaterally     Medications:  Scheduled: . enoxaparin (LOVENOX) injection  40 mg Subcutaneous Q24H  . pantoprazole  40 mg Oral Daily    Pertinent Labs/Diagnostics: No pertinent labs   Felicie Morn PA-C [facility] [phone]   Assessment: 32 year old female with history of  multiple sclerosis presenting with left optic neuritis.  At this point she has had 3 dose of Solu-Medrol with the fourth dose today.  Patient states that she has had no significant improvement in the left eye.  Will finish a 5 days of steroids and see how she is doing at that time.  Impression:  -Optic neuritis in left eye  Recommendations: -Continue Solu-Medrol for full 5 doses -Continue Protonix for gut protection -We will continue to watch patient and make further recommendations after 5 doses of Solu-Medrol    07/13/2018, 8:44 AM  Attending addendum I have seen and examined the patient. She continues to report no improvement in her left eye vision-visual acuity or light perception. She continues to report pain in her left eye.  On my examination: General exam: No apparent distress, sitting comfortably in bed HEENT: Normocephalic atraumatic but no obvious lesions CVS: S1-S2 regular rate rhythm Muscular skeletal: No joint tenderness deformity or swelling Skin: Warm dry and intact Neurological exam Mental status: Awake alert oriented x3. Speech-language: Not dysarthric.  Naming, comprehension and repetition are all intact. Cranial nerves: Pupils are equal round reactive to light, mild relative afferent pupillary defect on the left, she reported no light perception in the left eye except from the nasal side where she could barely see some light.  Visual acuity 20/20 in the right eye no nystagmus noted on moving the OKN strip in front of the left eye with right eye covered.  Face symmetric.  Tongue midline. Motor exam: 5/5 all over Sensory: No sensory  loss appreciated to light touch.  No extinction. DTRs: 2+ all over Coordination: Intact finger-nose-finger bilaterally  I personally reviewed the images MRI of the brain with and without contrast shows restricted diffusion in the left optic nerve and increased T2 signal in the left optic nerve indicating optic neuritis.  Labs reveal  hyperglycemia, leukocytosis-likely reactionary from steroids.  Assessment and plan 32 year old woman with a history of multiple sclerosis presenting with acute onset of left visual loss.  Her onset is fast for optic neuritis but with her history of MS and imaging findings consistent with left optic neuritis, she has been started on high-dose Solu-Medrol.  She has received 3 dose of IV Solu-Medrol. She reports no significant improvement in the visual acuity of the left eye.  Impression Optic neuritis left eye  Recommendations Continue Solu-Medrol for a total of 5 doses. Continue Protonix for GI protection If patient continues to show no improvement after 5 doses of Solu-Medrol 1 g IV daily, will consider 2 extra doses for a total of 7 days of high-dose steroids. If no improvement is noticed in 7 days, will consider plasma exchange-will need to have a detailed conversation with the patient at that time.  We will also attempt to reach the outpatient MS specialist for input as needed.  -- Milon Dikes, MD [facility] Pager: [phone] If 7pm to 7am, please call on call as listed on AMION.

## 2018-07-13 NOTE — Progress Notes (Signed)
Occupational Therapy Evaluation Patient Details Name: Alicia Barker MRN: [id number] DOB: 1986/01/09 Today's Date: 07/13/2018    History of Present Illness 32 y.o. female with a history of multiple sclerosis who presents with left optic neuritis.    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 Pt does not appear to have any functional vision with her L eye, affecting her ability to mobilize and complete ADL and IADL tasks safely. Pt loses her balance when turning toward her L side and requires external support to prevent falls. Will follow acutely to educate pt on compensatory strategies for low vision and facilitate safe DC home with HHOT. Will provide pt with information on [facility] to use as a resource.     Follow Up Recommendations  Home health OT;Supervision - Intermittent;Other (comment)([facility])    Equipment Recommendations  3 in 1 bedside commode    Recommendations for Other Services       Precautions / Restrictions Precautions Precautions: Fall      Mobility Bed Mobility Overal bed mobility: Independent                Transfers Overall transfer level: Needs assistance   Transfers: Sit to/from Stand;Stand Pivot Transfers Sit to Stand: Supervision Stand pivot transfers: Min assist       General transfer comment: LOB with turn toward L    Balance Overall balance assessment: Needs assistance   Sitting balance-Leahy Scale: Normal       Standing balance-Leahy Scale: Poor Standing balance comment: with dynamic tasks; falls toward L                           ADL either performed or assessed with clinical judgement   ADL Overall ADL's : Needs assistance/impaired Eating/Feeding: Modified independent   Grooming: Set up;Sitting   Upper Body Bathing: Set up;Sitting   Lower Body Bathing: Min guard;Sit  to/from stand   Upper Body Dressing : Set up;Sitting   Lower Body Dressing: Min guard;Sit to/from stand   Toilet Transfer: Min guard;Ambulation Toilet Transfer Details (indicate cue type and reason): LOB when looking L; turning left Toileting- Clothing Manipulation and Hygiene: Supervision/safety       Functional mobility during ADLs: Minimal assistance(when walking without AD) General ADL Comments: Began education on use of compensatory strategies for low vision; pt holding onto counter/rail on the wall; states she feels very unsteady; states she has broken the sop dispenser from falling into it     Vision Baseline Vision/History: No visual deficits Patient Visual Report: Central vision impairment;Peripheral vision impairment(L eye) Vision Assessment?: Yes Eye Alignment: Within Functional Limits Alignment/Gaze Preference: Within Defined Limits Tracking/Visual Pursuits: Decreased smoothness of horizontal tracking;Decreased smoothness of vertical tracking Saccades: Additional eye shifts occurred during testing;Additional head turns occurred during testing Depth Perception: Overshoots Additional Comments: Pt does not appeasr to have functional vision using her L eye; reports ability to see "some light" in nasal field of L eye; photophobia     Perception     Praxis      Pertinent Vitals/Pain Pain Assessment: Faces Faces Pain Scale: Hurts a little bit Pain Location: around L eye Pain Descriptors / Indicators: Discomfort Pain Intervention(s): Limited activity within patient's tolerance     Hand Dominance Right   Extremity/Trunk Assessment Upper Extremity Assessment Upper Extremity Assessment:  Overall WFL for tasks assessed(? mild ataxia LUE)   Lower Extremity Assessment Lower Extremity Assessment: Defer to PT evaluation   Cervical / Trunk Assessment Cervical / Trunk Assessment: Normal   Communication Communication Communication: No difficulties   Cognition  Arousal/Alertness: Awake/alert Behavior During Therapy: WFL for tasks assessed/performed Overall Cognitive Status: Within Functional Limits for tasks assessed                                     General Comments       Exercises     Shoulder Instructions      Home Living Family/patient expects to be discharged to:: Private residence Living Arrangements: Spouse/significant other;Children Available Help at Discharge: Family;Available PRN/intermittently Type of Home: Apartment Home Access: Stairs to enter Entrance Stairs-Number of Steps: flight @ 18 Entrance Stairs-Rails: Left Home Layout: Two level;Bed/bath upstairs     Bathroom Shower/Tub: Tub/shower unit;Curtain   Firefighter: Standard Bathroom Accessibility: Yes How Accessible: Accessible via walker Home Equipment: Cane - single point(has tub bench in her room)          Prior Functioning/Environment Level of Independence: Independent        Comments: works as Lawyer; 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        OT Problem List: Impaired balance (sitting and/or standing);Impaired vision/perception;Decreased safety awareness;Decreased knowledge of use of DME or AE;Pain      OT Treatment/Interventions: Self-care/ADL training;DME and/or AE instruction;Energy conservation;Therapeutic activities;Visual/perceptual remediation/compensation;Patient/family education;Balance training    OT Goals(Current goals can be found in the care plan section) Acute Rehab OT Goals Patient Stated Goal: to have her vision return OT Goal Formulation: With patient Time For Goal Achievement: 07/27/18 Potential to Achieve Goals: Good  OT Frequency: Min 3X/week   Barriers to D/C:            Co-evaluation              AM-PAC OT "6 Clicks" Daily Activity     Outcome Measure Help from another person eating meals?: None Help from another person taking care of personal grooming?: A Little Help from another person toileting,  which includes using toliet, bedpan, or urinal?: A Little Help from another person bathing (including washing, rinsing, drying)?: A Little Help from another person to put on and taking off regular upper body clothing?: None Help from another person to put on and taking off regular lower body clothing?: A Little 6 Click Score: 20   End of Session Equipment Utilized During Treatment: Gait belt Nurse Communication: Mobility status  Activity Tolerance: Patient tolerated treatment well Patient left: in chair;with call bell/phone within reach  OT Visit Diagnosis: Unsteadiness on feet (R26.81);Low vision, both eyes (H54.2);Pain Pain - Right/Left: Left Pain - part of body: (eye)                Time: 4098-1191 OT Time Calculation (min): 24 min Charges:  OT General Charges $OT Visit: 1 Visit OT Evaluation $OT Eval Moderate Complexity: 1 Mod OT Treatments $Self Care/Home Management : 8-22 mins  Luisa Dago, OT/L   Acute OT Clinical Specialist Acute Rehabilitation Services Pager [phone] Office [phone]   [facility] 07/13/2018, 9:11 AM

## 2018-07-13 NOTE — Progress Notes (Signed)
Family Medicine Teaching Service Daily Progress Note Intern Pager: [phone]  Patient name: Alicia Barker Medical record number: [id number] Date of birth: Feb 23, 1986 Age: 32 y.o. Gender: female  Primary Care Provider: Wendee Beavers, DO Consultants: Neurology Code Status: Full  Pt Overview and Major Events to Date:  11/29 - admitted for L eye vision changes  Assessment and Plan: Alicia Barker is a 32 y.o. female presenting with left-eye vision changes. PMH is significant for MS.   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  Pt with no improvement in visual loss since admission, only vision in proximal medial vision field. Will continue to monitor closely for changes.  -Neurology following, discussing potential increase in duration of steroids to 7 days vs current 5  -Cont IV solu-medrol 1g (day 4/5-7)  -Tylenol and/or ice pack PRN eye pain, HA  -Pt/OT >> recommending HH PT/OT   Normocytic Anemia: Chronic, Stable.  Baseline around 10.8-12.6.   Muscle spasms: Chronic, Stable.  No current endorsement of spasms.  - Hold home Flexeril - Monitor symptoms   Attention deficit Disorder: Stable.  Takes Adderall 10mg  BID PRN.  - Continue Home Adderall   FEN/GI: regular diet Prophylaxis: Lovenox  Disposition: continue inpatient management of MS exacerbation  Subjective:  Doing well this morning, endorses no improvement in her vision since admission. Notes some minimal pain with moving her eye, however hasn't noticed any redness or swelling around it. Worried this will be a lasting change. Denies any numbness, tingling, weakness, or abnormal movements.   Objective: Temp:  [98.8 F (37.1 C)] 98.8 F (37.1 C) (12/01 1701) Pulse Rate:  [60] 60 (12/01 1701) BP: (118)/(73) 118/73 (12/01 1701) SpO2:  [98 %] 98 % (12/01 1701) Physical Exam: General: Pleasant female, Alert, NAD HEENT: NCAT, MMM, EOMI, L pupil dilated however both reactive to light.   Cardiac: RRR no m/g/r Lungs: Clear bilaterally, no increased WOB  Abdomen: soft, non-tender, non-distended, normoactive BS Msk: Moves all extremities spontaneously  Ext: Warm, dry, 2+ distal pulses, no edema  Neuro: Alert and oriented, speech normal. L optic field decreased, only vision within proximal medial field at nose bridge. Sensation intact, hearing intact. No other remaining focal neuro deficits.   Laboratory: Recent Labs  Lab 07/10/18 0733 07/11/18 0607  WBC 5.9 17.4*  HGB 11.0* 11.5*  HCT 35.1* 36.3  PLT 232 279   Recent Labs  Lab 07/10/18 0733 07/11/18 0607 07/12/18 0404 07/13/18 0339  NA 137 134* 137 137  K 3.8 4.2 4.2 3.8  CL 107 103 104 104  CO2 23 21* 23 29  BUN 10 11 12 12   CREATININE 0.81 0.78 0.79 0.71  CALCIUM 9.0 9.6 8.9 8.6*  PROT 6.2*  --   --   --   BILITOT 0.5  --   --   --   ALKPHOS 55  --   --   --   ALT 23  --   --   --   AST 19  --   --   --   GLUCOSE 96 185* 135* 143*    Imaging/Diagnostic Tests: No results found.  Allayne Stack, DO 07/13/2018, 9:50 AM PGY-1, [facility] [facility] FPTS Intern pager: [phone], text pages welcome

## 2018-07-14 NOTE — Progress Notes (Signed)
Family Medicine Teaching Service Daily Progress Note Intern Pager: [phone]  Patient name: Alicia Barker Medical record number: [id number] Date of birth: 05/14/1986 Age: 32 y.o. Gender: female  Primary Care Provider: Wendee Beavers, DO Consultants: Neurology Code Status: Full  Pt Overview and Major Events to Date:  11/29 - admitted for L eye vision changes  Assessment and Plan: Alicia Barker is a 32 y.o. female presenting with left-eye vision changes thought to be secondary to MS exacerbation. PMH is significant for MS.   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  Patient continues to report no improvement in her vision loss since admission, still only has vision and proximal medial vision.  PT and OT recommending HH PT OT.  -Neurology following, likely to increase steroid course, appreciate recommendations -Continue IV Solu-Medrol 1 g (day 5) -Tylenol and/or ice pack as needed eye pain, headache  Normocytic Anemia: Chronic, Stable.  Baseline around 10.8-12.6.   Muscle spasms: Chronic, Stable.  No current endorsement of spasms.  - Hold home Flexeril - Monitor symptoms   Attention deficit Disorder: Stable.  Takes Adderall 10mg  BID PRN.  - Continue Home Adderall   FEN/GI: regular diet Prophylaxis: Lovenox  Disposition: continue inpatient management of MS exacerbation  Subjective:  Doing well, no complaints this morning however does continue to endorse the vision loss without any improvement since admission.  Denies any numbness/tingling, weakness, abdominal pain, shortness of breath.  Objective: Temp:  [98.3 F (36.8 C)-98.4 F (36.9 C)] 98.3 F (36.8 C) (12/03 0600) Pulse Rate:  [46-57] 46 (12/03 0600) Resp:  [18] 18 (12/03 0600) BP: (117-121)/(67-68) 121/67 (12/03 0600) SpO2:  [96 %-99 %] 99 % (12/03 0600) Physical Exam: General: Alert, NAD HEENT: NCAT, MMM, left pupil dilated however both reactive to light Cardiac: RRR no  m/g/r Lungs: Clear bilaterally, no increased WOB  Abdomen: soft, non-tender, non-distended, normoactive BS Msk: Moves all extremities spontaneously  Ext: Warm, dry, 2+ distal pulses, no edema Neuro: Alert and oriented, speech normal.  Left optic field decreased, only vision within the proximal medial feel that nose bridge which is still blurry at best for her.  Sensation intact, no other remaining focal neuro deficits.   Laboratory: Recent Labs  Lab 07/10/18 0733 07/11/18 0607  WBC 5.9 17.4*  HGB 11.0* 11.5*  HCT 35.1* 36.3  PLT 232 279   Recent Labs  Lab 07/10/18 0733 07/11/18 0607 07/12/18 0404 07/13/18 0339  NA 137 134* 137 137  K 3.8 4.2 4.2 3.8  CL 107 103 104 104  CO2 23 21* 23 29  BUN 10 11 12 12   CREATININE 0.81 0.78 0.79 0.71  CALCIUM 9.0 9.6 8.9 8.6*  PROT 6.2*  --   --   --   BILITOT 0.5  --   --   --   ALKPHOS 55  --   --   --   ALT 23  --   --   --   AST 19  --   --   --   GLUCOSE 96 185* 135* 143*    Imaging/Diagnostic Tests: No results found.  Alicia Stack, DO 07/14/2018, 8:41 AM PGY-1, [facility] FPTS Intern pager: [phone], text pages welcome

## 2018-07-14 NOTE — Progress Notes (Addendum)
NEUROLOGY PROGRESS NOTE  Subjective: Patient states that today she can see light when I flashlight in her eyes.  She also states that she possibly can see a crescent of light in the lower aspect of her vision on the left eye.  Otherwise unchanged.  Exam: Vitals:   07/13/18 1444 07/14/18 0600  BP: 117/68 121/67  Pulse: (!) 57 (!) 46  Resp: 18 18  Temp: 98.4 F (36.9 C) 98.3 F (36.8 C)  SpO2: 96% 99%    Physical Exam   HEENT-  Normocephalic, no lesions, without obvious abnormality.  Normal external eye and conjunctiva.   Extremities- Warm, dry and intact Musculoskeletal-no joint tenderness, deformity or swelling Skin-warm and dry, no hyperpigmentation, vitiligo, or suspicious lesions Neuro:  Mental Status: Alert, oriented, thought content appropriate.  Speech fluent without evidence of aphasia.  Able to follow 3 step commands without difficulty. Cranial Nerves: II: Right eye has full visual fields, left eye can see light only III,IV, VI: EOMI intact with APD of left eye V,VII: smile symmetric, facial light touch sensation normal bilaterally VIII: hearing normal bilaterally IX,X: uvula rises midline XI: bilateral shoulder shrug XII: midline tongue extension Motor: Right : Upper extremity   5/5    Left:     Upper extremity   5/5  Lower extremity   5/5     Lower extremity   5/5 Tone and bulk:normal tone throughout; no atrophy noted Sensory: Pinprick and light touch intact throughout, bilaterally   Medications:  Scheduled: . enoxaparin (LOVENOX) injection  40 mg Subcutaneous Q24H  . pantoprazole  40 mg Oral Daily   Continuous: . methylPREDNISolone (SOLU-MEDROL) injection 1,000 mg (07/14/18 0831)   WGN:[id number], amphetamine-dextroamphetamine, ibuprofen  Pertinent Labs/Diagnostics: -No new pertinent labs  No results found.   Felicie Morn PA-C [facility] [phone]  Assessment: 32 year old female with history of MS presenting with left optic  neuritis.  At this point she has had 4 doses Solu-Medrol with 5th dose to be given today.   No significant change other than possibly seeing some light.  Impression:  -Optic neuritis left eye  Recommendations: -Last day of Solu-Medrol today. Complete the total 5 days. -Continue Protonix for gut protection - Will follow patient tomorrow after her last dose of Solu-Medrol.  At that time will make decision if we will continue with 2 extra days to make a full 7 days of Solu-Medrol.  If no improvement after 7 days we will consider plasma exchange.  07/14/2018, 10:35 AM  Attending Neurohospitalist Addendum Patient seen and examined with APP/Resident. Agree with the history and physical as documented above. Agree with the plan as documented, which I helped formulate. I have independently reviewed the chart, obtained history, review of systems and examined the patient.I have personally reviewed pertinent head/neck/spine imaging (CT/MRI). Please feel free to call with any questions. --- Milon Dikes, MD [facility] Pager: [phone] If 7pm to 7am, please call on call as listed on AMION.

## 2018-07-14 NOTE — Progress Notes (Signed)
Family Medicine Teaching Service Daily Progress Note Intern Pager: [phone]  Patient name: Alicia Barker Medical record number: [id number] Date of birth: 1986/05/17 Age: 32 y.o. Gender: female  Primary Care Provider: Wendee Beavers, DO Consultants: Neurology Code Status: Full  Pt Overview and Major Events to Date:  11/29 - admitted for L eye vision changes  Assessment and Plan: Alicia Barker is a 32 y.o. female presenting with left-eye vision changes. PMH is significant for MS.   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  No improvement.  Continue to monitor, however concerning on day 6 of Solu-Medrol that this may be more of a permanent change. -Neurology following, appreciate recommendations for further therapy -Cont IV solu-medrol 1g (day 6)  -Tylenol and/or ice pack PRN eye pain, HA  -Pt/OT >> recommending HH PT/OT   Normocytic Anemia: Chronic, Stable.  Hemoglobin 11 this a.m.  Baseline around 10.8-12.6.   Muscle spasms: Chronic, Stable.  No current endorsement of spasms.  - Hold home Flexeril - Monitor symptoms   Attention deficit Disorder: Stable.  Takes Adderall 10mg  BID PRN.  - Continue Home Adderall   FEN/GI: regular diet Prophylaxis: Lovenox  Disposition: continue inpatient management of MS exacerbation  Subjective:  Doing well this morning, no complaints.  She continues to worry about her vision and if it will be coming back.  Objective: Temp:  [98.3 F (36.8 C)-98.4 F (36.9 C)] 98.3 F (36.8 C) (12/03 0600) Pulse Rate:  [46-57] 46 (12/03 0600) Resp:  [18] 18 (12/03 0600) BP: (117-121)/(67-68) 121/67 (12/03 0600) SpO2:  [96 %-99 %] 99 % (12/03 0600) Physical Exam: General: Alert, NAD HEENT: NCAT, MMM, left pupil slightly dilated however both reactive to light Cardiac: RRR no m/g/r Lungs: Clear bilaterally, no increased WOB  Abdomen: soft, non-tender, non-distended, normoactive BS Msk: Moves all extremities  spontaneously  Ext: Warm, dry, 2+ distal pulses, no edema  Neuro: Alert and oriented, speech normal.  Left optic field decreased, only vision within the proximal medial field of the nose bridge and some inferior vision.  Sensation intact no other remaining focal neuro deficits.  Laboratory: Recent Labs  Lab 07/10/18 0733 07/11/18 0607  WBC 5.9 17.4*  HGB 11.0* 11.5*  HCT 35.1* 36.3  PLT 232 279   Recent Labs  Lab 07/10/18 0733 07/11/18 0607 07/12/18 0404 07/13/18 0339  NA 137 134* 137 137  K 3.8 4.2 4.2 3.8  CL 107 103 104 104  CO2 23 21* 23 29  BUN 10 11 12 12   CREATININE 0.81 0.78 0.79 0.71  CALCIUM 9.0 9.6 8.9 8.6*  PROT 6.2*  --   --   --   BILITOT 0.5  --   --   --   ALKPHOS 55  --   --   --   ALT 23  --   --   --   AST 19  --   --   --   GLUCOSE 96 185* 135* 143*    Imaging/Diagnostic Tests: No results found.  Allayne Stack, DO 07/14/2018, 6:58 AM PGY-1, [facility] [facility] FPTS Intern pager: [phone], text pages welcome

## 2018-07-15 LAB — CBC WITH DIFFERENTIAL/PLATELET
Abs Immature Granulocytes: 0.76 10*3/uL — ABNORMAL HIGH (ref 0.00–0.07)
BASOS ABS: 0 10*3/uL (ref 0.0–0.1)
Basophils Relative: 0 %
Eosinophils Absolute: 0 10*3/uL (ref 0.0–0.5)
Eosinophils Relative: 0 %
HCT: 32.9 % — ABNORMAL LOW (ref 36.0–46.0)
Hemoglobin: 11 g/dL — ABNORMAL LOW (ref 12.0–15.0)
Immature Granulocytes: 4 %
Lymphocytes Relative: 7 %
Lymphs Abs: 1.2 10*3/uL (ref 0.7–4.0)
MCH: 28.6 pg (ref 26.0–34.0)
MCHC: 33.4 g/dL (ref 30.0–36.0)
MCV: 85.7 fL (ref 80.0–100.0)
Monocytes Absolute: 1.6 10*3/uL — ABNORMAL HIGH (ref 0.1–1.0)
Monocytes Relative: 9 %
Neutro Abs: 13.6 10*3/uL — ABNORMAL HIGH (ref 1.7–7.7)
Neutrophils Relative %: 80 %
Platelets: 254 10*3/uL (ref 150–400)
RBC: 3.84 MIL/uL — AB (ref 3.87–5.11)
RDW: 13.9 % (ref 11.5–15.5)
WBC: 17.2 10*3/uL — ABNORMAL HIGH (ref 4.0–10.5)
nRBC: 0 % (ref 0.0–0.2)

## 2018-07-15 NOTE — Progress Notes (Addendum)
NEUROLOGY PROGRESS NOTE  Subjective: Patient states that she has had no improvement in the left eye vision.  Exam: Vitals:   07/15/18 0530 07/15/18 0533  BP: 138/75   Pulse: (!) 47 (!) 45  Resp:    Temp:    SpO2: 100%     Physical Exam   HEENT-  Normocephalic, no lesions, without obvious abnormality.  Normal external eye and conjunctiva.   Extremities- Warm, dry and intact Musculoskeletal-no joint tenderness, deformity or swelling Skin-warm and dry, no hyperpigmentation, vitiligo, or suspicious lesions    Neuro:  Mental Status: Alert, oriented, thought content appropriate.  Speech fluent without evidence of aphasia.  Able to follow 3 step commands without difficulty. Cranial Nerves: II:  Visual fields grossly normal in the right eye.  Still has no vision in the left eye. III,IV, VI: ptosis not present, extra-ocular motions intact bilaterally pupils equal, round, reactive to light with APD in the left eye V,VII: smile symmetric, facial light touch sensation normal bilaterally VIII: hearing normal bilaterally IX,X: uvula rises midline XI: bilateral shoulder shrug XII: midline tongue extension Motor: Right : Upper extremity   5/5    Left:     Upper extremity   5/5  Lower extremity   5/5     Lower extremity   5/5 Tone and bulk:normal tone throughout; no atrophy noted Sensory: Pinprick and light touch intact throughout, bilaterally    Medications:  Prior to Admission:  Medications Prior to Admission  Medication Sig Dispense Refill Last Dose  . acetaminophen-codeine (TYLENOL #3) 300-30 MG tablet Take 1 tablet by mouth 3 (three) times daily. 90 tablet 0 Past Week at Unknown time  . amphetamine-dextroamphetamine (ADDERALL) 10 MG tablet Take 1 tablet (10 mg total) by mouth 2 (two) times daily. (Patient taking differently: Take 10 mg by mouth as needed. ) 60 tablet 0 06/29/2018  . cyclobenzaprine (FLEXERIL) 5 MG tablet TAKE 1 TABLET BY MOUTH EVERY NIGHT AT BEDTIME. TAKE UP TO  THREE TIMES DAILY (Patient taking differently: Take 5 mg by mouth as needed for muscle spasms. ) 90 tablet 11 06/29/2018 at prn  . ibuprofen (ADVIL,MOTRIN) 800 MG tablet Take 800 mg by mouth as needed.  0 06/26/2018  . ondansetron (ZOFRAN) 4 MG tablet Take 1 tablet (4 mg total) by mouth as needed for nausea or vomiting. 3 tablet 0 06/29/2018 at prn  . meclizine (ANTIVERT) 25 MG tablet Take 1 tablet (25 mg total) by mouth 3 (three) times daily as needed for dizziness. (Patient not taking: Reported on 07/10/2018) 30 tablet 0 Not Taking at Unknown time   Scheduled: . enoxaparin (LOVENOX) injection  40 mg Subcutaneous Q24H  . pantoprazole  40 mg Oral Daily   Continuous: . methylPREDNISolone (SOLU-MEDROL) injection Stopped (07/14/18 0901)   ZOX:[id number], amphetamine-dextroamphetamine, ibuprofen    Felicie Morn PA-C [facility] [phone]   ATTENDING ADDENDUM Pt seen and examined. I also communicated with Dr. Epimenio Foot who agrees with offering PLEX.  Assessment: 32 year old female with history of MS presenting with left optic neuritis.  She is received a full 5 doses of Solu-Medrol with no improvement.  I have discussed with Dr. Epimenio Foot of [facility] neurology about starting Plavix instead of 7 days of Solu-Medrol.  He is in agreement of starting Plavix sooner than later.  Impression:  -Optic neuritis of left eye  Recommendations: -At this point Lovenox has been held today and will contact IR to place catheter in order for her to obtain Plex for 5 days, if she is able  to tolerate well will try to discharge her the next day so that she can come back to the hospital to receive the 5 days as an outpatient. -First round of PLEX likely tomorrow. TUNNELED CATH FOR PLEX TODAY AT SOME POINT - Patient will need to follow-up with Dr. Epimenio Foot as an outpatient-I have made an ambulatory referral to Dr. Epimenio Foot in discharge orders  07/15/2018, 8:50 AM   -- Milon Dikes, MD [facility] Pager: [phone] If 7pm to 7am, please call on call as listed on AMION.

## 2018-07-15 NOTE — Progress Notes (Signed)
Physical Therapy Treatment Patient Details Name: Alicia Barker MRN: [id number] DOB: 25-Mar-1986 Today's Date: 07/15/2018    History of Present Illness 32 y.o. female with a history of multiple sclerosis who presents with left optic neuritis.     PT Comments    Pt in bed with mother at bedside. Pt willing to participate in therapy, but she was hesitant when asked to walk. She stated that she became very dizzy when letting go of the RW when walking in room. Pt participated in gait training with a RW this session and required Min A for safety and for navigating obstacles in hallway. Pt aware to turn head when when making turns toward the left due to L visual deficits. Pt also participated in standing LE exercises and tolerated them well. Pt would benefit from continued PT in order to progress toward stated goals and maximize functional independence. Pt remains appropriate for HHPT based on current functional status.       Follow Up Recommendations  Home health PT;Supervision - Intermittent     Equipment Recommendations       Recommendations for Other Services OT consult     Precautions / Restrictions Precautions Precautions: Fall Restrictions Weight Bearing Restrictions: No    Mobility  Bed Mobility Overal bed mobility: Independent                Transfers Overall transfer level: Needs assistance Equipment used: Rolling walker (2 wheeled) Transfers: Sit to/from Stand Sit to Stand: Supervision Stand pivot transfers: Min assist       General transfer comment: Supervision for safety  Ambulation/Gait Ambulation/Gait assistance: Min assist Gait Distance (Feet): 125 Feet Assistive device: Rolling walker (2 wheeled) Gait Pattern/deviations: Step-through pattern;Decreased stride length;Drifts right/left     General Gait Details: Pt is very cautious. Pt stated that she felt dizzy earlier today when she tried to walk without the RW. Min A for safety and navigating  objects in hallway.  When asked to make turn toward L, pt required no cueing on turning her head first for safety.    Stairs             Wheelchair Mobility    Modified Rankin (Stroke Patients Only)       Balance Overall balance assessment: Needs assistance   Sitting balance-Leahy Scale: Normal       Standing balance-Leahy Scale: Poor Standing balance comment: reliant on external support                            Cognition Arousal/Alertness: Awake/alert Behavior During Therapy: WFL for tasks assessed/performed Overall Cognitive Status: Within Functional Limits for tasks assessed                                 General Comments: Pt hesitant to let go of RW due to dizziness and feeling "off balance"      Exercises Total Joint Exercises Ankle Circles/Pumps: AROM;10 reps;Right;Left;Standing Hip ABduction/ADduction: AROM;10 reps;Right;Left;Standing Knee Flexion: AROM;Right;Left;10 reps;Standing Marching in Standing: AROM;10 reps;Right;Left;Standing Standing Hip Extension: AROM;Right;Left;10 reps;Standing Other Exercises Other Exercises: activities to practce compensating for decreased depth perception    General Comments        Pertinent Vitals/Pain Pain Assessment: Faces Faces Pain Scale: Hurts a little bit Pain Location: around L eye Pain Descriptors / Indicators: Discomfort Pain Intervention(s): Limited activity within patient's tolerance;Monitored during session    Home Living  Prior Function            PT Goals (current goals can now be found in the care plan section) Acute Rehab PT Goals Patient Stated Goal: to have her vision return PT Goal Formulation: With patient Time For Goal Achievement: 07/27/18 Potential to Achieve Goals: Good Progress towards PT goals: Progressing toward goals    Frequency    Min 3X/week      PT Plan Current plan remains appropriate    Co-evaluation               AM-PAC PT "6 Clicks" Mobility   Outcome Measure  Help needed turning from your back to your side while in a flat bed without using bedrails?: None Help needed moving from lying on your back to sitting on the side of a flat bed without using bedrails?: A Little Help needed moving to and from a bed to a chair (including a wheelchair)?: A Little Help needed standing up from a chair using your arms (e.g., wheelchair or bedside chair)?: A Little Help needed to walk in hospital room?: A Little Help needed climbing 3-5 steps with a railing? : A Little 6 Click Score: 19    End of Session Equipment Utilized During Treatment: Gait belt Activity Tolerance: Patient tolerated treatment well Patient left: in bed;with call bell/phone within reach;with bed alarm set;with family/visitor present Nurse Communication: Mobility status PT Visit Diagnosis: Unsteadiness on feet (R26.81);Other abnormalities of gait and mobility (R26.89)     Time: 2952-8413 PT Time Calculation (min) (ACUTE ONLY): 15 min  Charges:  $Therapeutic Exercise: 8-22 mins                     [address], SPTA   [place] 07/15/2018, 4:43 PM

## 2018-07-15 NOTE — Progress Notes (Signed)
Occupational Therapy Treatment Patient Details Name: Alicia Barker MRN: [id number] DOB: 01-Jul-1986 Today's Date: 07/15/2018    History of present illness 32 y.o. female with a history of multiple sclerosis who presents with left optic neuritis.    OT comments  Educated pt on compensatory strategies for low vision. Written  information reviewed. Community resources for visually impaired reviewed with pt in case needed at later date. Pt is unsteady with mobility due to visual disturbance and will need to use a RW to reduce risk of falls. Continue to recommend HHOT. Pt very appreciative.  Pt/mother asking about a letter from the physician regarding the need to reside in a first floor apt - nsg notified.    Follow Up Recommendations  Home health OT;Supervision - Intermittent;Other (comment)    Equipment Recommendations  3 in 1 bedside commode;Other (comment)(RW)    Recommendations for Other Services      Precautions / Restrictions Precautions Precautions: Fall Restrictions Weight Bearing Restrictions: No       Mobility Bed Mobility Overal bed mobility: Modified Independent                Transfers Overall transfer level: Needs assistance Equipment used: 1 person hand held assist Transfers: Sit to/from Stand Sit to Stand: Supervision Stand pivot transfers: Min assist       General transfer comment: increased comfort with use of RW    Balance Overall balance assessment: Needs assistance           Standing balance-Leahy Scale: Poor Standing balance comment: reliant on external support                           ADL either performed or assessed with clinical judgement   ADL                                         General ADL Comments: Able to complete ADL with set up. Mom able to assist. Educated pt regarding home safety adn reducing risk of falls. REcommend pt have hands on asssit with transfers in/out of tub. Pt/family  verbalized understanding. Educated pt/mom on need to hold railing wen going up/down stair and on/off curb due to disturbance with depth perception. Educated on need to bump items, i.e. cup/pitcher to reduce spills/increase safety.       Vision   Additional Comments: pt reports no change with L eye vision. Writtent information reveiwed with pt regarding compensatory strategies for low vision. Pt also givne resource for Services for the Blind in case vision doe snot improve.    Perception     Praxis      Cognition Arousal/Alertness: Awake/alert Behavior During Therapy: WFL for tasks assessed/performed Overall Cognitive Status: Within Functional Limits for tasks assessed                                 General Comments: Pt hesitant to let go of RW due to dizziness and feeling "off balance"        Exercises Exercises: Other exercises Other Exercises Other Exercises: activities to practce compensating for decreased depth perception   Shoulder Instructions       General Comments      Pertinent Vitals/ Pain       Pain Assessment: Faces Faces Pain Scale: Hurts a little  bit Pain Location: around L eye Pain Descriptors / Indicators: Discomfort Pain Intervention(s): Limited activity within patient's tolerance;Monitored during session  Home Living                                          Prior Functioning/Environment              Frequency  Min 3X/week        Progress Toward Goals  OT Goals(current goals can now be found in the care plan section)  Progress towards OT goals: Progressing toward goals  Acute Rehab OT Goals Patient Stated Goal: to have her vision return OT Goal Formulation: With patient Time For Goal Achievement: 07/27/18 Potential to Achieve Goals: Good ADL Goals Pt Will Perform Lower Body Dressing: with modified independence;sit to/from stand Pt Will Transfer to Toilet: with modified independence;ambulating Pt Will  Perform Tub/Shower Transfer: Tub transfer;ambulating;tub bench;with modified independence;rolling walker Additional ADL Goal #1: Pt will indepedently verbalize 3 compensatory strategies for low vision  Plan Discharge plan remains appropriate    Co-evaluation                 AM-PAC OT "6 Clicks" Daily Activity     Outcome Measure   Help from another person eating meals?: None Help from another person taking care of personal grooming?: A Little Help from another person toileting, which includes using toliet, bedpan, or urinal?: A Little Help from another person bathing (including washing, rinsing, drying)?: A Little Help from another person to put on and taking off regular upper body clothing?: None Help from another person to put on and taking off regular lower body clothing?: A Little 6 Click Score: 20    End of Session Equipment Utilized During Treatment: Gait belt;Rolling walker  OT Visit Diagnosis: Unsteadiness on feet (R26.81);Low vision, both eyes (H54.2);Pain Pain - Right/Left: Left Pain - part of body: (eye)   Activity Tolerance Patient tolerated treatment well   Patient Left with call bell/phone within reach;in bed;with family/visitor present   Nurse Communication Mobility status;Other (comment)(need for RW and pt quesiotns about letter for apt)        Time: 3500-9381 OT Time Calculation (min): 23 min  Charges: OT General Charges $OT Visit: 1 Visit OT Treatments $Self Care/Home Management : 23-37 mins  Luisa Dago, OT/L   Acute OT Clinical Specialist Acute Rehabilitation Services Pager [phone] Office [phone]    [facility] 07/15/2018, 3:36 PM

## 2018-07-15 NOTE — Progress Notes (Signed)
Patient heart rate in the 40's. Heart 45 when taken manually. Patient is asymptomatic. Patient is currently resting. Notified Merdis Delay, NP.  Will continue to monitor the patient.

## 2018-07-15 NOTE — Consult Note (Signed)
Chief Complaint: Patient was seen in consultation today for tunneled catheter placement.  Referring Physician(s): Dr. Milon Dikes  Supervising Physician: Simonne Come  Patient Status: [facility] - In-pt  History of Present Illness: Alicia Barker is a 32 y.o. female with a past medical history significant for depression and multiple sclerosis who presented to [facility] ED on due to vision changes. She reported that her vision in her left eye became blurred and progressively darker like a shade was closing over it, she now reports complete vision loss in her left eye. She has a history of MS with vision problems and last received Ocrevus infusion about 6 months ago. She is currently followed by neurology who requests tunneled catheter placement for 5 rounds of plasma exchange.   Patient reports she has not had any improvement in her vision since admission, she is aware of plan for plasma exchange and need for tunneled catheter placement. She states she is nervous about having a catheter coming from her neck but is ready for the procedure.   Past Medical History:  Diagnosis Date  . Depression    no meds currently  . MS (multiple sclerosis) (HCC)   . SVD (spontaneous vaginal delivery)    x 5    Past Surgical History:  Procedure Laterality Date  . LAPAROSCOPIC TUBAL LIGATION Bilateral 01/06/2013   Procedure: LAPAROSCOPIC TUBAL LIGATION;  Surgeon: Kathreen Cosier, MD;  Location: [facility];  Service: Gynecology;  Laterality: Bilateral;  . right hand surgery      Allergies: Penicillins  Medications: Prior to Admission medications   Medication Sig Start Date End Date Taking? Authorizing Provider  acetaminophen-codeine (TYLENOL #3) 300-30 MG tablet Take 1 tablet by mouth 3 (three) times daily. 06/01/18  Yes Sater, Pearletha Furl, MD  amphetamine-dextroamphetamine (ADDERALL) 10 MG tablet Take 1 tablet (10 mg total) by mouth 2 (two) times daily. Patient taking differently: Take 10 mg by mouth as  needed.  06/01/18  Yes Sater, Pearletha Furl, MD  cyclobenzaprine (FLEXERIL) 5 MG tablet TAKE 1 TABLET BY MOUTH EVERY NIGHT AT BEDTIME. TAKE UP TO THREE TIMES DAILY Patient taking differently: Take 5 mg by mouth as needed for muscle spasms.  03/20/18  Yes Sater, Pearletha Furl, MD  ibuprofen (ADVIL,MOTRIN) 800 MG tablet Take 800 mg by mouth as needed. 06/10/18  Yes [provider]  ondansetron (ZOFRAN) 4 MG tablet Take 1 tablet (4 mg total) by mouth as needed for nausea or vomiting. 03/28/18  Yes Wurst, [place], PA-C  meclizine (ANTIVERT) 25 MG tablet Take 1 tablet (25 mg total) by mouth 3 (three) times daily as needed for dizziness. Patient not taking: Reported on 07/10/2018 03/01/16   Asa Lente, MD  methylPREDNISolone sodium succinate 1,000 mg in sodium chloride 0.9 % 50 mL Inject 1,000 mg into the vein daily for 3 days. 07/12/18 07/15/18  Melene Plan, MD  pantoprazole (PROTONIX) 40 MG tablet Take 1 tablet (40 mg total) by mouth daily. 07/12/18   Melene Plan, MD     Family History  Problem Relation Age of Onset  . Diabetes Mother   . Heart disease Mother   . Diabetes Father   . Multiple sclerosis Cousin     Social History   Socioeconomic History  . Marital status: Married    Spouse name: Not on file  . Number of children: Not on file  . Years of education: Not on file  . Highest education level: Not on file  Occupational History  .  Not on file  Social Needs  . Financial resource strain: Not on file  . Food insecurity:    Worry: Not on file    Inability: Not on file  . Transportation needs:    Medical: Not on file    Non-medical: Not on file  Tobacco Use  . Smoking status: Former Smoker    Packs/day: 0.10    Types: Cigarettes  . Smokeless tobacco: Never Used  Substance and Sexual Activity  . Alcohol use: No    Alcohol/week: 0.0 standard drinks  . Drug use: No  . Sexual activity: Yes    Birth control/protection: None  Lifestyle  . Physical activity:    Days per  week: Not on file    Minutes per session: Not on file  . Stress: Not on file  Relationships  . Social connections:    Talks on phone: Not on file    Gets together: Not on file    Attends religious service: Not on file    Active member of club or organization: Not on file    Attends meetings of clubs or organizations: Not on file    Relationship status: Not on file  Other Topics Concern  . Not on file  Social History Narrative  . Not on file     Review of Systems: A 12 point ROS discussed and pertinent positives are indicated in the HPI above.  All other systems are negative.  Review of Systems  Constitutional: Negative for appetite change, chills and fever.  Eyes: Positive for visual disturbance (vision loss - left eye).  Respiratory: Negative for cough and shortness of breath.   Cardiovascular: Negative for chest pain.  Gastrointestinal: Negative for abdominal pain, nausea and vomiting.  Neurological: Negative for dizziness, seizures, syncope and headaches.    Vital Signs: BP 138/75 (BP Location: Left Arm)   Pulse (!) 45   Temp 99 F (37.2 C) (Oral)   Resp 12   Ht 5\' 4"  (1.626 m)   Wt 198 lb 0.1 oz (89.8 kg)   LMP 06/25/2018 (Exact Date)   SpO2 100%   BMI 33.99 kg/m   Physical Exam  Constitutional: She is oriented to person, place, and time. No distress.  HENT:  Head: Normocephalic.  Cardiovascular: Normal rate, regular rhythm and normal heart sounds.  Pulmonary/Chest: Effort normal and breath sounds normal.  Abdominal: Soft. She exhibits no distension. There is no tenderness.  Neurological: She is alert and oriented to person, place, and time.  Skin: Skin is warm and dry. She is not diaphoretic.  Psychiatric: She has a normal mood and affect. Her behavior is normal. Judgment and thought content normal.  Nursing note and vitals reviewed.    MD Evaluation Airway: WNL Heart: WNL Abdomen: WNL Chest/ Lungs: WNL ASA  Classification: 3 Mallampati/Airway Score:  Two   Imaging: Mr Laqueta Jean And Wo Contrast  Result Date: 07/10/2018 CLINICAL DATA:  Multiple sclerosis with vision loss. Left eye visual loss. EXAM: MRI HEAD WITHOUT AND WITH CONTRAST TECHNIQUE: Multiplanar, multiecho pulse sequences of the brain and surrounding structures were obtained without and with intravenous contrast. CONTRAST:  9 mL Gadavist COMPARISON:  MRI brain 03/25/2016 FINDINGS: Brain: Restricted diffusion is evident within the left optic nerve. Associated T2 signal changes are best seen on the FLAIR images. A subcortical lesion in the right frontal lobe on image 91 of series 16 and 39 of series 15 has increased in size since the prior exam. There is no associated restricted diffusion with  this lesion. There is disc scratched at there is decreased conspicuity of lesions in the anterior inferior left frontal lobe and right parietal lobe. Brainstem lesions are less prominent on today's study. No other new lesions are present. No hemorrhage or mass lesion is present. The ventricles are of normal size. No significant extra-axial fluid collection is present. Vascular: Flow is present in the major intracranial arteries. Skull and upper cervical spine: The craniocervical junction is normal. Decreased marrow signal is present in the upper cervical spine. Sinuses/Orbits: Mild mucosal thickening, left greater than right is present in the ethmoid air cells bilaterally. The paranasal sinuses and mastoid air cells are otherwise clear. IMPRESSION: 1. New increased T2 signal and restricted diffusion in the left optic nerve is most consistent with active demyelination in this patient with known multiple sclerosis. This corresponds with patient's decreased left visual acuity. 2. Increased size of subcortical T2 lesion in the posterior right frontal lobe, also consistent with demyelination. 3. Multiple other lesions are decreased in size compared to the prior study. Findings are typical of waxing and waning  multiple sclerosis. Electronically Signed   By: Marin Roberts M.D.   On: 07/10/2018 10:05    Labs:  CBC: Recent Labs    07/15/17 1131 07/10/18 0733 07/11/18 0607 07/15/18 0325  WBC 4.7 5.9 17.4* 17.2*  HGB 10.8* 11.0* 11.5* 11.0*  HCT 33.5* 35.1* 36.3 32.9*  PLT 281 232 279 254    COAGS: No results for input(s): INR, APTT in the last 8760 hours.  BMP: Recent Labs    07/10/18 0733 07/11/18 0607 07/12/18 0404 07/13/18 0339  NA 137 134* 137 137  K 3.8 4.2 4.2 3.8  CL 107 103 104 104  CO2 23 21* 23 29  GLUCOSE 96 185* 135* 143*  BUN 10 11 12 12   CALCIUM 9.0 9.6 8.9 8.6*  CREATININE 0.81 0.78 0.79 0.71  GFRNONAA >60 >60 >60 >60  GFRAA >60 >60 >60 >60    LIVER FUNCTION TESTS: Recent Labs    07/10/18 0733  BILITOT 0.5  AST 19  ALT 23  ALKPHOS 55  PROT 6.2*  ALBUMIN 3.3*    TUMOR MARKERS: No results for input(s): AFPTM, CEA, CA199, CHROMGRNA in the last 8760 hours.  Assessment and Plan:  Patient with history of MS who presented to [facility] ED with complaints of vision loss in left eye - she is followed by neurology who has requested tunneled catheter placement for 5 rounds of outpatient plasma exchange.   Planned for tunneled catheter placement 12/5 in IR pending any urgent procedures. Orders placed for NPO after midnight, AM labs, continue to hold lovenox, one time dose of vancomycin to be given in IR pre-procedure. Please call IR with any questions or concerns.  Risks and benefits discussed with the patient including, but not limited to bleeding, infection, vascular injury, pneumothorax which may require chest tube placement, air embolism or even death.  All of the patient's questions were answered, patient is agreeable to proceed.  Consent signed and in chart.  Thank you for this interesting consult.  I greatly enjoyed meeting TANIYAH BALLOW and look forward to participating in their care.  A copy of this report was sent to the requesting provider on  this date.  Electronically Signed: Villa Herb, PA-C 07/15/2018, 10:59 AM   I spent a total of 40 Minutes  in face to face in clinical consultation, greater than 50% of which was counseling/coordinating care for tunneled catheter placement.

## 2018-07-15 NOTE — Progress Notes (Addendum)
Family Medicine Teaching Service Daily Progress Note Intern Pager: [phone]  Patient name: Alicia Barker Medical record number: [id number] Date of birth: 1985-08-22 Age: 32 y.o. Gender: female  Primary Care Provider: Wendee Beavers, DO Consultants: Neurology Code Status: Full  Pt Overview and Major Events to Date:  11/29 - admitted for L eye vision changes  Assessment and Plan: Alicia Barker is a 32 y.o. female presenting with left-eye vision changes thought to be secondary to MS exacerbation. PMH is significant for MS.   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  No improvement.  Concerned this may be a lasting impact given no improvement following 5 days of Solu-Medrol. Neurology stopping solu-medrol today, starting plasma exchange therapy.  -Neurology following, appreciate recommendations for further therapies -D/c'd IV Solu-Medrol per Neuro, starting Plasma exchange once line is placed  --Follow up tunnel like cath placement --Ensure outpatient f/u for catheter removal after 5 doses of PLEX  -Tylenol and/or ice pack as needed eye pain, headache  Normocytic Anemia: Chronic, Stable.  Hemoglobin 11 this a.m.  Baseline around 10.8-12.6.   Muscle spasms: Chronic, Stable.  No current endorsement of spasms.  - Hold home Flexeril - Monitor symptoms   Attention deficit Disorder: Stable.  Takes Adderall 10mg  BID PRN.  - Continue Home Adderall   FEN/GI: regular diet Prophylaxis: Lovenox  Disposition: continue inpatient management of MS exacerbation, potential d/c tomorrow if line and therapy started and tolerating well   Subjective:  Doing well this morning no complaints.  Continues to worry about her vision in her eye.  Objective: Temp:  [98.1 F (36.7 C)-99 F (37.2 C)] 99 F (37.2 C) (12/04 0517) Pulse Rate:  [44-65] 45 (12/04 0533) Resp:  [12-18] 12 (12/04 0517) BP: (98-138)/(55-87) 138/75 (12/04 0530) SpO2:  [95 %-100 %] 100 %  (12/04 0530) Physical Exam: General: Alert, NAD HEENT: NCAT, MMM, left pupil slightly dilated however both reactive to light Cardiac: RRR no m/g/r Lungs: Clear bilaterally, no increased WOB  Abdomen: soft, non-tender, non-distended, normoactive BS Msk: Moves all extremities spontaneously  Ext: Warm, dry, 2+ distal pulses, no edema  Neuro: Alert and orientated, speech near normal.  Left optic field decrease, only vision within the proximal medial field at the nose bridge which is still blurry and some vision in the inferior field.  No remaining focal neuro deficits.    Laboratory: Recent Labs  Lab 07/10/18 0733 07/11/18 0607 07/15/18 0325  WBC 5.9 17.4* 17.2*  HGB 11.0* 11.5* 11.0*  HCT 35.1* 36.3 32.9*  PLT 232 279 254   Recent Labs  Lab 07/10/18 0733 07/11/18 0607 07/12/18 0404 07/13/18 0339  NA 137 134* 137 137  K 3.8 4.2 4.2 3.8  CL 107 103 104 104  CO2 23 21* 23 29  BUN 10 11 12 12   CREATININE 0.81 0.78 0.79 0.71  CALCIUM 9.0 9.6 8.9 8.6*  PROT 6.2*  --   --   --   BILITOT 0.5  --   --   --   ALKPHOS 55  --   --   --   ALT 23  --   --   --   AST 19  --   --   --   GLUCOSE 96 185* 135* 143*    Imaging/Diagnostic Tests: No results found.  Allayne Stack, DO 07/15/2018, 8:30 AM PGY-1, [facility] Family Medicine FPTS Intern pager: [phone], text pages welcome

## 2018-07-16 ENCOUNTER — Inpatient Hospital Stay (HOSPITAL_COMMUNITY): Payer: Medicaid Other

## 2018-07-16 ENCOUNTER — Encounter (HOSPITAL_COMMUNITY): Payer: Self-pay | Admitting: Interventional Radiology

## 2018-07-16 HISTORY — PX: IR FLUORO GUIDE CV LINE RIGHT: IMG2283

## 2018-07-16 HISTORY — PX: IR US GUIDE VASC ACCESS RIGHT: IMG2390

## 2018-07-16 LAB — POCT I-STAT, CHEM 8
BUN: 17 mg/dL (ref 6–20)
Calcium, Ion: 1 mmol/L — ABNORMAL LOW (ref 1.15–1.40)
Chloride: 100 mmol/L (ref 98–111)
Creatinine, Ser: 0.6 mg/dL (ref 0.44–1.00)
Glucose, Bld: 71 mg/dL (ref 70–99)
HCT: 35 % — ABNORMAL LOW (ref 36.0–46.0)
HEMOGLOBIN: 11.9 g/dL — AB (ref 12.0–15.0)
Potassium: 3.7 mmol/L (ref 3.5–5.1)
Sodium: 137 mmol/L (ref 135–145)
TCO2: 27 mmol/L (ref 22–32)

## 2018-07-16 LAB — CBC
HCT: 35.2 % — ABNORMAL LOW (ref 36.0–46.0)
Hemoglobin: 11.6 g/dL — ABNORMAL LOW (ref 12.0–15.0)
MCH: 28.5 pg (ref 26.0–34.0)
MCHC: 33 g/dL (ref 30.0–36.0)
MCV: 86.5 fL (ref 80.0–100.0)
Platelets: 237 10*3/uL (ref 150–400)
RBC: 4.07 MIL/uL (ref 3.87–5.11)
RDW: 13.9 % (ref 11.5–15.5)
WBC: 10.4 10*3/uL (ref 4.0–10.5)
nRBC: 0 % (ref 0.0–0.2)

## 2018-07-16 LAB — PROTIME-INR
INR: 1.06
Prothrombin Time: 13.7 seconds (ref 11.4–15.2)

## 2018-07-16 MED ORDER — SODIUM CHLORIDE 0.9 % IV SOLN
INTRAVENOUS | Status: AC
  Administered 2018-07-16 (×3): via INTRAVENOUS_CENTRAL
  Filled 2018-07-16 (×3): qty 200

## 2018-07-16 MED ORDER — LIDOCAINE HCL (PF) 1 % IJ SOLN
INTRAMUSCULAR | Status: AC | PRN
  Administered 2018-07-16: 5 mL

## 2018-07-16 MED ORDER — HEPARIN SODIUM (PORCINE) 1000 UNIT/ML IJ SOLN
INTRAMUSCULAR | Status: AC
  Filled 2018-07-16: qty 1

## 2018-07-16 MED ORDER — HEPARIN SODIUM (PORCINE) 1000 UNIT/ML IJ SOLN
INTRAMUSCULAR | Status: AC
  Administered 2018-07-16: 3200 [IU]
  Filled 2018-07-16: qty 4

## 2018-07-16 MED ORDER — MIDAZOLAM HCL 2 MG/2ML IJ SOLN
INTRAMUSCULAR | Status: AC | PRN
  Administered 2018-07-16: 0.5 mg via INTRAVENOUS
  Administered 2018-07-16: 1 mg via INTRAVENOUS

## 2018-07-16 MED ORDER — ACD FORMULA A 0.73-2.45-2.2 GM/100ML VI SOLN
Status: AC
  Filled 2018-07-16: qty 500

## 2018-07-16 MED ORDER — FENTANYL CITRATE (PF) 100 MCG/2ML IJ SOLN
INTRAMUSCULAR | Status: AC
  Filled 2018-07-16: qty 4

## 2018-07-16 MED ORDER — HEPARIN SODIUM (PORCINE) 1000 UNIT/ML IJ SOLN
1000.0000 [IU] | Freq: Once | INTRAMUSCULAR | Status: AC
  Administered 2018-07-16: 3200 [IU]
  Filled 2018-07-16: qty 1

## 2018-07-16 MED ORDER — ACD FORMULA A 0.73-2.45-2.2 GM/100ML VI SOLN
500.0000 mL | Status: DC
  Filled 2018-07-16: qty 500

## 2018-07-16 MED ORDER — POLYETHYLENE GLYCOL 3350 17 G PO PACK
17.0000 g | PACK | Freq: Every day | ORAL | Status: DC
  Administered 2018-07-17: 17 g via ORAL
  Filled 2018-07-16 (×2): qty 1

## 2018-07-16 MED ORDER — CALCIUM CARBONATE ANTACID 500 MG PO CHEW
2.0000 | CHEWABLE_TABLET | ORAL | Status: AC
  Administered 2018-07-16 (×2): 400 mg via ORAL

## 2018-07-16 MED ORDER — MIDAZOLAM HCL 2 MG/2ML IJ SOLN
INTRAMUSCULAR | Status: AC
  Filled 2018-07-16: qty 4

## 2018-07-16 MED ORDER — VANCOMYCIN HCL IN DEXTROSE 1-5 GM/200ML-% IV SOLN
INTRAVENOUS | Status: AC
  Administered 2018-07-16: 1000 mg
  Filled 2018-07-16: qty 200

## 2018-07-16 MED ORDER — SODIUM CHLORIDE 0.9 % IV SOLN
INTRAVENOUS | Status: AC | PRN
  Administered 2018-07-16: 10 mL/h via INTRAVENOUS

## 2018-07-16 MED ORDER — LIDOCAINE HCL 1 % IJ SOLN
INTRAMUSCULAR | Status: AC
  Filled 2018-07-16: qty 20

## 2018-07-16 MED ORDER — FENTANYL CITRATE (PF) 100 MCG/2ML IJ SOLN
INTRAMUSCULAR | Status: AC | PRN
  Administered 2018-07-16: 50 ug via INTRAVENOUS
  Administered 2018-07-16: 25 ug via INTRAVENOUS

## 2018-07-16 MED ORDER — SODIUM CHLORIDE 0.9 % IV SOLN
2.0000 g | Freq: Once | INTRAVENOUS | Status: AC
  Administered 2018-07-16: 2 g via INTRAVENOUS
  Filled 2018-07-16: qty 20

## 2018-07-16 MED ORDER — DIPHENHYDRAMINE HCL 25 MG PO CAPS: 25.0000 mg | ORAL_CAPSULE | Freq: Four times a day (QID) | ORAL | Status: DC | PRN

## 2018-07-16 MED ORDER — CALCIUM CARBONATE ANTACID 500 MG PO CHEW
CHEWABLE_TABLET | ORAL | Status: AC
  Administered 2018-07-16: 400 mg via ORAL
  Filled 2018-07-16: qty 4

## 2018-07-16 MED ORDER — ACETAMINOPHEN 325 MG PO TABS
650.0000 mg | ORAL_TABLET | ORAL | Status: DC | PRN
  Filled 2018-07-16: qty 2

## 2018-07-16 NOTE — Progress Notes (Signed)
Family Medicine Teaching Service Daily Progress Note Intern Pager: [phone]  Patient name: Alicia Barker Medical record number: [id number] Date of birth: [birth year] Age: 32 y.o. Gender: female  Primary Care Provider: Wendee Beavers, DO Consultants: Neurology Code Status: Full  Pt Overview and Major Events to Date:  11/29 - admitted for L eye vision changes  Assessment and Plan: Alicia Barker is a 32 y.o. female presenting with left-eye vision changes thought to be secondary to MS exacerbation. PMH is significant for MS.   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  No improvement.  Starting plasma exchange therapy with tunnel cath placement today via IR. -Neurology following, recommending plasma exchange therapy X 5 days - Ensure outpatient F/U for catheter removal after 5 doses of Plex -Tylenol and/or ice pack as needed for eye pain, headache  Normocytic Anemia: Chronic, Stable.  Hemoglobin 11.6 this a.m.  Baseline around 10.8-12.6.   Muscle spasms: Chronic, Stable.  No current endorsement of spasms.  - Hold home Flexeril - Monitor symptoms   Attention deficit Disorder: Stable.  Takes Adderall 10mg  BID PRN.  - Continue Home Adderall   FEN/GI: regular diet, start MiraLAX Prophylaxis: Lovenox held for procedure, encourage ambulation  Disposition: Pending line placement and tolerating plasma exchange therapy today, may go home this afternoon  Subjective:  Doing well this morning, however feels that she is bloated as she has not had a bowel movement in a few days.  Continues to have no improvement in her vision.  Objective: Temp:  [98.5 F (36.9 C)-98.8 F (37.1 C)] 98.8 F (37.1 C) (12/05 0519) Pulse Rate:  [74-78] 78 (12/05 0519) Resp:  [14-16] 16 (12/05 0519) BP: (104-114)/(62-78) 104/65 (12/05 0519) SpO2:  [99 %-100 %] 99 % (12/05 0519) Physical Exam: General: Alert, NAD HEENT: NCAT, MMM, pupils reactive to light Cardiac: RRR no  m/g/r Lungs: Clear bilaterally, no increased WOB  Abdomen: soft, non-tender, non-distended, normoactive BS Msk: Moves all extremities spontaneously  Ext: Warm, dry, 2+ distal pulses, no edema Neuro: Alert and oriented, speech normal.  Left optic field decreased, only vision within the proximal medial field and some vision in the inferior field, blurry at best.  No remaining focal neuro deficits  Laboratory: Recent Labs  Lab 07/11/18 0607 07/15/18 0325 07/16/18 0500  WBC 17.4* 17.2* 10.4  HGB 11.5* 11.0* 11.6*  HCT 36.3 32.9* 35.2*  PLT 279 254 237   Recent Labs  Lab 07/10/18 0733 07/11/18 0607 07/12/18 0404 07/13/18 0339  NA 137 134* 137 137  K 3.8 4.2 4.2 3.8  CL 107 103 104 104  CO2 23 21* 23 29  BUN 10 11 12 12   CREATININE 0.81 0.78 0.79 0.71  CALCIUM 9.0 9.6 8.9 8.6*  PROT 6.2*  --   --   --   BILITOT 0.5  --   --   --   ALKPHOS 55  --   --   --   ALT 23  --   --   --   AST 19  --   --   --   GLUCOSE 96 185* 135* 143*    Imaging/Diagnostic Tests: No results found.  Alicia Stack, DO 07/16/2018, 8:11 AM PGY-1, [place] [facility] FPTS Intern pager: [phone], text pages welcome

## 2018-07-16 NOTE — Progress Notes (Addendum)
Neurology Progress Note   S:// Seen and examined.  Pending catheter placement for plasma exchange.  Plasma exchange scheduled for today.  No change in visual acuity.   O:// Current vital signs: BP 104/65 (BP Location: Left Arm)   Pulse 78   Temp 98.8 F (37.1 C) (Oral)   Resp 16   Ht 5\' 4"  (1.626 m)   Wt 89.8 kg   LMP 06/25/2018 (Exact Date)   SpO2 99%   BMI 33.99 kg/m  Vital signs in last 24 hours: Temp:  [98.5 F (36.9 C)-98.8 F (37.1 C)] 98.8 F (37.1 C) (12/05 0519) Pulse Rate:  [74-78] 78 (12/05 0519) Resp:  [14-16] 16 (12/05 0519) BP: (104-114)/(62-78) 104/65 (12/05 0519) SpO2:  [99 %-100 %] 99 % (12/05 0519) General: Awake alert oriented x3 in no distress H ENT: Normocephalic atraumatic Lungs: Clear to auscultation Cardiovascular: S1-S2 heard regular rate rhythm Extremities: No edema Neurological exam Awake alert oriented x3 Speech is clear No evidence of aphasia Cranial nerves: Pupils are equal, left APD.  Grossly intact visual fields.  Light perception in the left eye.  Normal visual acuity in the right eye, face symmetric, tongue midline. Motor exam: 5/5 all over with no vertical drift. Sensory exam: Intact to touch all over Coordination: Intact finger-nose-finger bilaterally.  Intact heel-knee-shin bilaterally. DTRs: 2+ all over  Medications  Current Facility-Administered Medications:  .  acetaminophen (TYLENOL) tablet 650 mg, 650 mg, Oral, Q6H PRN, Winfrey, Harlen Labs, MD .  amphetamine-dextroamphetamine (ADDERALL) tablet 10 mg, 10 mg, Oral, BID PRN, Frances Furbish, Harlen Labs, MD .  ibuprofen (ADVIL,MOTRIN) tablet 400 mg, 400 mg, Oral, Q6H PRN, Winfrey, Amanda C, MD .  methylPREDNISolone sodium succinate (SOLU-MEDROL) 1,000 mg in sodium chloride 0.9 % 50 mL IVPB, 1,000 mg, Intravenous, Daily, Arline Asp, NP, Stopped at 07/14/18 0901 .  pantoprazole (PROTONIX) EC tablet 40 mg, 40 mg, Oral, Daily, Melene Plan, MD, 40 mg at 07/15/18 0823 Labs CBC     Component Value Date/Time   WBC 10.4 07/16/2018 0500   RBC 4.07 07/16/2018 0500   HGB 11.6 (L) 07/16/2018 0500   HGB 10.8 (L) 07/15/2017 1131   HCT 35.2 (L) 07/16/2018 0500   HCT 33.5 (L) 07/15/2017 1131   PLT 237 07/16/2018 0500   PLT 281 07/15/2017 1131   MCV 86.5 07/16/2018 0500   MCV 86 07/15/2017 1131   MCH 28.5 07/16/2018 0500   MCHC 33.0 07/16/2018 0500   RDW 13.9 07/16/2018 0500   RDW 16.1 (H) 07/15/2017 1131   LYMPHSABS 1.2 07/15/2018 0325   LYMPHSABS 1.2 07/15/2017 1131   MONOABS 1.6 (H) 07/15/2018 0325   EOSABS 0.0 07/15/2018 0325   EOSABS 0.3 07/15/2017 1131   BASOSABS 0.0 07/15/2018 0325   BASOSABS 0.0 07/15/2017 1131    CMP     Component Value Date/Time   NA 137 07/13/2018 0339   K 3.8 07/13/2018 0339   CL 104 07/13/2018 0339   CO2 29 07/13/2018 0339   GLUCOSE 143 (H) 07/13/2018 0339   BUN 12 07/13/2018 0339   CREATININE 0.71 07/13/2018 0339   CALCIUM 8.6 (L) 07/13/2018 0339   PROT 6.2 (L) 07/10/2018 0733   PROT 6.5 04/16/2016 1119   ALBUMIN 3.3 (L) 07/10/2018 0733   ALBUMIN 3.9 04/16/2016 1119   AST 19 07/10/2018 0733   ALT 23 07/10/2018 0733   ALKPHOS 55 07/10/2018 0733   BILITOT 0.5 07/10/2018 0733   BILITOT <0.2 04/16/2016 1119   GFRNONAA >60 07/13/2018 0339   GFRAA >  60 07/13/2018 0339    Imaging I have reviewed images in epic and the results pertinent to this consultation are: MR brain with evidence of left optic neuritis.  Assessment:  32 year old woman with history of MS presenting with left optic neuritis, received 5 days of Solu-Medrol with no improvement in the left eyes visual acuity.  Discussed with outpatient neurologist Dr. Epimenio Foot and the patient about plasma exchange. Awaiting plasma exchange catheter placement today and plasma exchange later in the day today.  Impression: Left optic neuritis  Recommendations: Tunneled catheter placement today For start of plasma exchange likely today. If she remains stable after the first  round of plasma exchange today, she can be discharged home tomorrow with instructions to return back every other day for a complete course of 5 rounds of plasma change. An appointment with our should be made after the last start of plasma exchange for catheter removal. She should follow-up with Dr. Epimenio Foot in outpatient clinic in 2 to 4 weeks after the last of plasma exchange.  -- Milon Dikes, MD [facility] Pager: [phone] If 7pm to 7am, please call on call as listed on AMION.

## 2018-07-16 NOTE — Sedation Documentation (Signed)
Patient is resting comfortably. 

## 2018-07-16 NOTE — Sedation Documentation (Signed)
Patient denies pain and is resting comfortably.  

## 2018-07-16 NOTE — Procedures (Signed)
  Procedure: R IJ tunneled pheresis catheter 19cm EBL:   minimal Complications:  none immediate  See full dictation in [employer].  Thora Lance MD Main # [phone] Pager  [phone]

## 2018-07-17 ENCOUNTER — Other Ambulatory Visit: Payer: Self-pay | Admitting: Student in an Organized Health Care Education/Training Program

## 2018-07-17 DIAGNOSIS — G35 Multiple sclerosis: Secondary | ICD-10-CM

## 2018-07-17 NOTE — Progress Notes (Addendum)
Tx performed and charted by SPTA under direct guidance and supervision of PTA at all times. This session was performed under the supervision of a licensed clinician.   Charting reviewed for accuracy and depicts tx performed and services provided.   Will inform supervising PT of change in equipment recommendations.   Joycelyn Rua, PTA Acute Rehabilitation Services Pager [phone] Office [phone]    Physical Therapy Treatment Patient Details Name: Alicia Barker MRN: [id number] DOB: 10-27-1985 Today's Date: 07/17/2018    History of Present Illness 32 y.o. female with a history of multiple sclerosis who presents with left optic neuritis.     PT Comments    Pt in bed with husband at bedside. Pt agreeable to therapy. She participated in gait and stair training this session. Pt required Min A with RW for safety and to navigate hallway. Pt still likes to veer to the L to walk along railing in hallway. Pt required Min A with stairs today and was fearful of falling when descending stairs and felt she needed to use R handrail instead of L. Explained to pt's husband that she will require assist when ascending/descending stairs at home for safety. Pt would benefit from continued PT in order to progress toward stated goals and maximize functional independence. Pt remains appropriate for HHPT based on current functional status. Inform supervising PT of equipment recommendations for discharge.  Follow Up Recommendations  Home health PT;Supervision - Intermittent     Equipment Recommendations  3in1 (PT);Rolling walker with 5" wheels    Recommendations for Other Services OT consult     Precautions / Restrictions Precautions Precautions: Fall Restrictions Weight Bearing Restrictions: No    Mobility  Bed Mobility Overal bed mobility: Modified Independent                Transfers Overall transfer level: Needs assistance Equipment used: Rolling walker (2 wheeled) Transfers: Sit  to/from Stand Sit to Stand: Supervision         General transfer comment: Supervision for safety  Ambulation/Gait Ambulation/Gait assistance: Min assist Gait Distance (Feet): 100 Feet Assistive device: Rolling walker (2 wheeled) Gait Pattern/deviations: Step-through pattern;Decreased stride length;Drifts right/left Gait velocity: decreased   General Gait Details: Pt is very cautious when walking. She tends to stay close to L side of hallway and requries VC to stay in middle of hallway. Pt also keeping L eye closed during gait, she reported that she can see general shapes and shadows. She requested an eye patch-RN notifed    Stairs Stairs: Yes Stairs assistance: Min assist Stair Management: One rail Left;One rail Right   General stair comments: Pt ascending and descending stairs with step to pattern, she was very cautious when descending stairs. She has a fear of heights and was afraid of falling. She reached of R hand rail while descending. Spoke to pt's husband and explained that pt will require Assist when going up stair at home.    Wheelchair Mobility    Modified Rankin (Stroke Patients Only)       Balance Overall balance assessment: Needs assistance   Sitting balance-Leahy Scale: Normal       Standing balance-Leahy Scale: Poor Standing balance comment: reliant on external support                            Cognition Arousal/Alertness: Awake/alert Behavior During Therapy: WFL for tasks assessed/performed Overall Cognitive Status: Within Functional Limits for tasks assessed  General Comments: Pt hesitant to let go of RW due to dizziness and feeling "off balance"      Exercises      General Comments        Pertinent Vitals/Pain Faces Pain Scale: Hurts a little bit Pain Location: around L eye Pain Descriptors / Indicators: Discomfort Pain Intervention(s): Limited activity within patient's  tolerance;Monitored during session    Home Living                      Prior Function            PT Goals (current goals can now be found in the care plan section) Acute Rehab PT Goals Patient Stated Goal: to have her vision return PT Goal Formulation: With patient Time For Goal Achievement: 07/27/18 Potential to Achieve Goals: Good Progress towards PT goals: Progressing toward goals    Frequency    Min 3X/week      PT Plan Current plan remains appropriate    Co-evaluation              AM-PAC PT "6 Clicks" Mobility   Outcome Measure  Help needed turning from your back to your side while in a flat bed without using bedrails?: None Help needed moving from lying on your back to sitting on the side of a flat bed without using bedrails?: A Little Help needed moving to and from a bed to a chair (including a wheelchair)?: A Little Help needed standing up from a chair using your arms (e.g., wheelchair or bedside chair)?: A Little Help needed to walk in hospital room?: A Little Help needed climbing 3-5 steps with a railing? : A Little 6 Click Score: 19    End of Session Equipment Utilized During Treatment: Gait belt Activity Tolerance: Patient tolerated treatment well Patient left: in bed;with call bell/phone within reach;with bed alarm set;with family/visitor present Nurse Communication: Mobility status PT Visit Diagnosis: Unsteadiness on feet (R26.81);Other abnormalities of gait and mobility (R26.89)     Time: 2707-8675 PT Time Calculation (min) (ACUTE ONLY): 24 min  Charges:  $Gait Training: 8-22 mins $Therapeutic Activity: 8-22 mins                     [address], SPTA   [place] 07/17/2018, 4:33 PM

## 2018-07-17 NOTE — Progress Notes (Signed)
Brief note Patient had first plasma exchange yesterday. Tolerated well with no complaints or issues. Reports some subjective improvement of light perception in the affected eye. From a neurological standpoint, she should be able to be discharged and can come back every other day for the plasma exchange for a total of 5 rounds. Once the 5 rounds of plasma exchange completed, the catheter should be removed. She should follow-up with Dr. Felecia Shelling at [facility] neurology in 2 to 4 weeks after completing plasma exchange. I met with the patient in the room, did a brief examination which is unchanged from prior, and answered all her questions. I have paged the primary team to give him a phone recommendations and him awaiting a callback. Please call neurology with questions.  -- Amie Portland, MD [facility] Pager: [phone] If 7pm to 7am, please call on call as listed on AMION.

## 2018-07-17 NOTE — Progress Notes (Signed)
Alicia Barker to be D/C'd  per MD order.  Discussed with the patient and all questions fully answered.  VSS, Skin clean, dry and intact without evidence of skin break down, no evidence of skin tears noted. IV catheter discontinued. Jugular double lumen  Intact and will continues her treatment at outpatient, IV team notify . Site without signs and symptoms of complications. Dressing and pressure applied.  An After Visit Summary was printed and given to the patient. Patient received prescription.  D/c education completed with patient/family including follow up instructions, medication list, d/c activities limitations if indicated, with other d/c instructions as indicated by MD - patient able to verbalize understanding, all questions fully answered.   Patient instructed to return to ED, call 911, or call MD for any changes in condition.   Patient escorted via WC, and D/C home via private auto.   [name] 07/17/2018 5:50 PM

## 2018-07-17 NOTE — Care Management Note (Signed)
Case Management Note  Patient Details  Name: Alicia Barker MRN: [id number] Date of Birth: 02-28-1986  Subjective/Objective:       Presented with left-eye vision changes thought to be secondary to MS exacerbation. PMH is significant forMS.              Royal Carolin Coy (Spouse)     [phone]      MVH:QIONG McMullen  Action/Plan: Transition to home with ho[facility] health services to follow ( PT, RN). Husband to provide transportation to home. - S/p first plasma exchange treatment on 12/5 which will continue for an additional 4 doses, outpatient. Pt will register in the ED and exchanges will take place on the HD unit.  Expected Discharge Date:  07/17/18               Expected Discharge Plan:  Home w Home Health Services  In-House Referral:     Discharge planning Services  CM Consult  Post Acute Care Choice:  Home Health, Durable Medical Equipment Choice offered to:  Patient  DME Arranged:  Tub bench,rolling walker, 3 in 1/ bcs DME Agency:  [facility] equipment will be delivered prior to discharge  HH Arranged:  RN, PT [facility] Agency:  [facility]  Status of Service:  Completed, signed off  If discussed at Long Length of Stay Meetings, dates discussed:    Additional Comments:  Epifanio Lesches, RN 07/17/2018, 3:40 PM

## 2018-07-17 NOTE — Progress Notes (Signed)
IR removal of tunnel cath order placed for 12/13 after plasma exchange is complete.

## 2018-07-17 NOTE — Discharge Instructions (Signed)
You were admitted to [facility] for new onset vision loss due to your multiple sclerosis.  We gave you IV steroids for 5 days, transition to plasma exchange therapy in attempts to improve the vision in your eye.  You received already your first dose on 12/5 and will continue to have these at [facility] every other day until 12/13.  We will schedule an appointment for you to have your catheter removed after you finish treatments, please make sure that you do have this removed following the end of treatment.  Please also make sure you follow-up with the neurology office in the next 2-4 weeks.  It was wonderful to have been a part of your care, we hope your vision continues to improve.

## 2018-07-17 NOTE — Progress Notes (Addendum)
Family Medicine Teaching Service Daily Progress Note Intern Pager: [phone]  Patient name: Alicia Barker Medical record number: [id number] Date of birth: November 04, 1985 Age: 32 y.o. Gender: female  Primary Care Provider: Wendee Beavers, DO Consultants: Neurology Code Status: Full  Pt Overview and Major Events to Date:  11/29 - admitted for L eye vision changes  Assessment and Plan: Alicia Barker is a 32 y.o. female presenting with left-eye vision changes thought to be secondary to MS exacerbation. PMH is significant for MS.    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  Endorses some blurry peripheral vision returning today. S/p first plasma exchange treatment on 12/5.  To continue for an additional 4 doses, likely discharge this afternoon. -4 additional doses of Plex per neurology -Tylenol and/or ice pack as needed for eye pain, headache --Will need IR removal of tunneled cath order placed outpatient  Normocytic Anemia: Chronic, Stable.  At Baseline of 10.8-12.6.   Muscle spasms: Chronic, Stable.  No current endorsement of spasms.  - Hold home Flexeril - Monitor symptoms   Attention deficit Disorder: Stable.  Takes Adderall 10mg  BID PRN.  - Continue Home Adderall   FEN/GI: regular diet, cont MiraLAX Prophylaxis: Lovenox held for procedure, encourage ambulation  Disposition: Discharge this afternoon  Subjective:  Doing well this morning, actually notes a little bit of improvement in her peripheral vision.  Denies any further complaints.  Objective: Temp:  [97.8 F (36.6 C)-98.7 F (37.1 C)] 98.7 F (37.1 C) (12/06 0602) Pulse Rate:  [63-91] 87 (12/06 0602) Resp:  [8-23] 16 (12/06 0602) BP: (94-123)/(56-77) 95/62 (12/06 0602) SpO2:  [95 %-100 %] 99 % (12/06 0602) Weight:  [95.3 kg] 95.3 kg (12/06 0500) Physical Exam: General: Alert, NAD HEENT: NCAT, MMM, pupils reactive to light Cardiac: RRR no m/g/r Lungs: Clear  bilaterally, no increased WOB  Abdomen: soft, non-tender, non-distended, normoactive BS Msk: Moves all extremities spontaneously  Ext: Warm, dry, 2+ distal pulses, no edema  Neuro: Alert and oriented, speech normal.  Improving left optic field, has some vision in her proximal medial field and now some peripheral vision.  States she can see blurry objects mainly with motion, not able to tell what it is.  No remaining focal neuro deficits  Laboratory: Recent Labs  Lab 07/11/18 0607 07/15/18 0325 07/16/18 0500 07/16/18 1456  WBC 17.4* 17.2* 10.4  --   HGB 11.5* 11.0* 11.6* 11.9*  HCT 36.3 32.9* 35.2* 35.0*  PLT 279 254 237  --    Recent Labs  Lab 07/11/18 0607 07/12/18 0404 07/13/18 0339 07/16/18 1456  NA 134* 137 137 137  K 4.2 4.2 3.8 3.7  CL 103 104 104 100  CO2 21* 23 29  --   BUN 11 12 12 17   CREATININE 0.78 0.79 0.71 0.60  CALCIUM 9.6 8.9 8.6*  --   GLUCOSE 185* 135* 143* 71    Imaging/Diagnostic Tests: Ir Fluoro Guide Cv Line Right  Result Date: 07/16/2018 CLINICAL DATA:  Multiple sclerosis, optic neuritis, needs venous access for recurrent pheresis EXAM: TUNNELED PHERESIS/HEMODIALYSIS CATHETER PLACEMENT WITH ULTRASOUND AND FLUOROSCOPIC GUIDANCE TECHNIQUE: The procedure, risks, benefits, and alternatives were explained to the patient. Questions regarding the procedure were encouraged and answered. The patient understands and consents to the procedure. As antibiotic prophylaxis, vancomycin 1 g was ordered pre-procedure and administered intravenously within one hour of incision. patency of the right IJ vein was confirmed with ultrasound with image documentation. An appropriate skin site was determined. Region was prepped using  maximum barrier technique including cap and mask, sterile gown, sterile gloves, large sterile sheet, and Chlorhexidine as cutaneous antisepsis. The region was infiltrated locally with 1% lidocaine. Intravenous Fentanyl and Versed were administered as  conscious sedation during continuous monitoring of the patient's level of consciousness and physiological / cardiorespiratory status by the radiology RN, with a total moderate sedation time of 14 minutes. Under real-time ultrasound guidance, the right IJ vein was accessed with a 21 gauge micropuncture needle; the needle tip within the vein was confirmed with ultrasound image documentation. Needle exchanged over the 018 guidewire for transitional dilator, which allowed advancement of a Benson wire into the IVC. Over this, an MPA catheter was advanced. A Palindrome 19 hemodialysis catheter was tunneled from the right anterior chest wall approach to the right IJ dermatotomy site. The MPA catheter was exchanged over an Amplatz wire for serial vascular dilators which allow placement of a peel-away sheath, through which the catheter was advanced under intermittent fluoroscopy, positioned with its tips in the proximal and midright atrium. Spot chest radiograph confirms good catheter position. No pneumothorax. Catheter was flushed and primed per protocol. Catheter secured externally with O Prolene sutures. The right IJ dermatotomy site was closed with Dermabond. COMPLICATIONS: COMPLICATIONS None immediate FLUOROSCOPY TIME:  0.6 minute; 27  uGym2 DAP COMPARISON:  None IMPRESSION: 1. Technically successful placement of tunneled right IJ pheresis/hemodialysis catheter with ultrasound and fluoroscopic guidance. Ready for routine use. ACCESS: Remains approachable for percutaneous intervention as needed. Electronically Signed   By: Corlis Leak M.D.   On: 07/16/2018 12:42   Ir US Guide Vasc Access Right  Result Date: 07/16/2018 CLINICAL DATA:  Multiple sclerosis, optic neuritis, needs venous access for recurrent pheresis EXAM: TUNNELED PHERESIS/HEMODIALYSIS CATHETER PLACEMENT WITH ULTRASOUND AND FLUOROSCOPIC GUIDANCE TECHNIQUE: The procedure, risks, benefits, and alternatives were explained to the patient. Questions regarding  the procedure were encouraged and answered. The patient understands and consents to the procedure. As antibiotic prophylaxis, vancomycin 1 g was ordered pre-procedure and administered intravenously within one hour of incision. patency of the right IJ vein was confirmed with ultrasound with image documentation. An appropriate skin site was determined. Region was prepped using maximum barrier technique including cap and mask, sterile gown, sterile gloves, large sterile sheet, and Chlorhexidine as cutaneous antisepsis. The region was infiltrated locally with 1% lidocaine. Intravenous Fentanyl and Versed were administered as conscious sedation during continuous monitoring of the patient's level of consciousness and physiological / cardiorespiratory status by the radiology RN, with a total moderate sedation time of 14 minutes. Under real-time ultrasound guidance, the right IJ vein was accessed with a 21 gauge micropuncture needle; the needle tip within the vein was confirmed with ultrasound image documentation. Needle exchanged over the 018 guidewire for transitional dilator, which allowed advancement of a Benson wire into the IVC. Over this, an MPA catheter was advanced. A Palindrome 19 hemodialysis catheter was tunneled from the right anterior chest wall approach to the right IJ dermatotomy site. The MPA catheter was exchanged over an Amplatz wire for serial vascular dilators which allow placement of a peel-away sheath, through which the catheter was advanced under intermittent fluoroscopy, positioned with its tips in the proximal and midright atrium. Spot chest radiograph confirms good catheter position. No pneumothorax. Catheter was flushed and primed per protocol. Catheter secured externally with O Prolene sutures. The right IJ dermatotomy site was closed with Dermabond. COMPLICATIONS: COMPLICATIONS None immediate FLUOROSCOPY TIME:  0.6 minute; 27  uGym2 DAP COMPARISON:  None IMPRESSION: 1. Technically successful  placement of tunneled right IJ pheresis/hemodialysis catheter with ultrasound and fluoroscopic guidance. Ready for routine use. ACCESS: Remains approachable for percutaneous intervention as needed. Electronically Signed   By: Corlis Leak M.D.   On: 07/16/2018 12:42    Allayne Stack, DO 07/17/2018, 9:15 AM PGY-1,  Family Medicine FPTS Intern pager: [phone], text pages welcome

## 2018-07-18 ENCOUNTER — Ambulatory Visit (HOSPITAL_COMMUNITY)
Admission: RE | Admit: 2018-07-18 | Discharge: 2018-07-18 | Disposition: A | Discharge status: Home / Self Care | Payer: Medicaid Other | Attending: Student in an Organized Health Care Education/Training Program | Admitting: Student in an Organized Health Care Education/Training Program

## 2018-07-18 ENCOUNTER — Ambulatory Visit (HOSPITAL_COMMUNITY)
Admission: RE | Admit: 2018-07-18 | Payer: Medicaid Other | Source: Home / Self Care | Admitting: Student in an Organized Health Care Education/Training Program

## 2018-07-18 DIAGNOSIS — G35 Multiple sclerosis: Secondary | ICD-10-CM | POA: Diagnosis present

## 2018-07-18 DIAGNOSIS — D649 Anemia, unspecified: Secondary | ICD-10-CM | POA: Insufficient documentation

## 2018-07-18 DIAGNOSIS — F909 Attention-deficit hyperactivity disorder, unspecified type: Secondary | ICD-10-CM | POA: Insufficient documentation

## 2018-07-18 DIAGNOSIS — Z79899 Other long term (current) drug therapy: Secondary | ICD-10-CM | POA: Diagnosis not present

## 2018-07-18 DIAGNOSIS — Z880 Allergy status to penicillin: Secondary | ICD-10-CM | POA: Insufficient documentation

## 2018-07-18 LAB — COMPREHENSIVE METABOLIC PANEL
ALT: 18 U/L (ref 0–44)
AST: 15 U/L (ref 15–41)
Albumin: 3.5 g/dL (ref 3.5–5.0)
Alkaline Phosphatase: 30 U/L — ABNORMAL LOW (ref 38–126)
Anion gap: 8 (ref 5–15)
BUN: 13 mg/dL (ref 6–20)
CO2: 25 mmol/L (ref 22–32)
Calcium: 8.5 mg/dL — ABNORMAL LOW (ref 8.9–10.3)
Chloride: 103 mmol/L (ref 98–111)
Creatinine, Ser: 0.56 mg/dL (ref 0.44–1.00)
GFR calc Af Amer: >60 mL/min (ref >60)
GFR calc non Af Amer: >60 mL/min (ref >60)
Glucose, Bld: 79 mg/dL (ref 70–99)
Potassium: 3.8 mmol/L (ref 3.5–5.1)
Sodium: 136 mmol/L (ref 135–145)
Total Bilirubin: 1 mg/dL (ref 0.3–1.2)
Total Protein: 5.2 g/dL — ABNORMAL LOW (ref 6.5–8.1)

## 2018-07-18 LAB — POCT I-STAT, CHEM 8
BUN: 8 mg/dL (ref 6–20)
CREATININE: 0.3 mg/dL — AB (ref 0.44–1.00)
Calcium, Ion: 1.6 mmol/L (ref 1.15–1.40)
Chloride: 112 mmol/L — ABNORMAL HIGH (ref 98–111)
Glucose, Bld: 77 mg/dL (ref 70–99)
HCT: 31 % — ABNORMAL LOW (ref 36.0–46.0)
Hemoglobin: 10.5 g/dL — ABNORMAL LOW (ref 12.0–15.0)
Potassium: 2.1 mmol/L — CL (ref 3.5–5.1)
Sodium: 143 mmol/L (ref 135–145)
TCO2: 17 mmol/L — ABNORMAL LOW (ref 22–32)

## 2018-07-18 LAB — CBC
HCT: 33.3 % — ABNORMAL LOW (ref 36.0–46.0)
Hemoglobin: 10.6 g/dL — ABNORMAL LOW (ref 12.0–15.0)
MCH: 27.9 pg (ref 26.0–34.0)
MCHC: 31.8 g/dL (ref 30.0–36.0)
MCV: 87.6 fL (ref 80.0–100.0)
Platelets: 222 10*3/uL (ref 150–400)
RBC: 3.8 MIL/uL — ABNORMAL LOW (ref 3.87–5.11)
RDW: 14.3 % (ref 11.5–15.5)
WBC: 10 10*3/uL (ref 4.0–10.5)
nRBC: 0 % (ref 0.0–0.2)

## 2018-07-18 MED ORDER — ACETAMINOPHEN 325 MG PO TABS: 650.0000 mg | ORAL_TABLET | ORAL | Status: DC | PRN

## 2018-07-18 MED ORDER — CALCIUM CARBONATE ANTACID 500 MG PO CHEW
2.0000 | CHEWABLE_TABLET | ORAL | Status: DC
  Administered 2018-07-18: 400 mg via ORAL

## 2018-07-18 MED ORDER — CALCIUM CARBONATE ANTACID 500 MG PO CHEW
CHEWABLE_TABLET | ORAL | Status: AC
  Administered 2018-07-18: 400 mg via ORAL
  Filled 2018-07-18: qty 4

## 2018-07-18 MED ORDER — ACD FORMULA A 0.73-2.45-2.2 GM/100ML VI SOLN
Status: AC
  Administered 2018-07-18: 500 mL via INTRAVENOUS
  Filled 2018-07-18: qty 500

## 2018-07-18 MED ORDER — DIPHENHYDRAMINE HCL 25 MG PO CAPS: 25.0000 mg | ORAL_CAPSULE | Freq: Four times a day (QID) | ORAL | Status: DC | PRN

## 2018-07-18 MED ORDER — SODIUM CHLORIDE 0.9 % IV SOLN
INTRAVENOUS | Status: AC
  Administered 2018-07-18 (×3): via INTRAVENOUS_CENTRAL
  Filled 2018-07-18 (×3): qty 200

## 2018-07-18 MED ORDER — SODIUM CHLORIDE 0.9 % IV SOLN
2.0000 g | Freq: Once | INTRAVENOUS | Status: AC
  Administered 2018-07-18: 2 g via INTRAVENOUS
  Filled 2018-07-18: qty 20

## 2018-07-18 MED ORDER — HEPARIN SODIUM (PORCINE) 1000 UNIT/ML IJ SOLN
INTRAMUSCULAR | Status: AC
  Administered 2018-07-18: 3200 [IU]
  Filled 2018-07-18: qty 4

## 2018-07-18 MED ORDER — ACD FORMULA A 0.73-2.45-2.2 GM/100ML VI SOLN
500.0000 mL | Status: DC
  Administered 2018-07-18: 500 mL via INTRAVENOUS

## 2018-07-18 MED ORDER — HEPARIN SODIUM (PORCINE) 1000 UNIT/ML IJ SOLN
1000.0000 [IU] | Freq: Once | INTRAMUSCULAR | Status: AC
  Administered 2018-07-18: 3200 [IU]

## 2018-07-18 NOTE — Discharge Summary (Signed)
Family Medicine Teaching [facility] Discharge Summary  Patient name: Alicia Barker Medical record number: [id number] Date of birth: 06/10/1986 Age: 32 y.o. Gender: female Date of Admission: 11/29  Date of Discharge: 07/17/2018 Admitting Physician: Milon Dikes, MD  Primary Care Provider: Wendee Beavers, DO Consultants: Neurology  Indication for Hospitalization: Optic Neuritis 2/2 MS   Discharge Diagnoses/Problem List:  Optic Neuritis, improving  Multiple Sclerosis  ADHD  Normocytic anemia  Disposition: Home   Discharge Condition: Stable   Discharge Exam: General: Alert, NAD HEENT: NCAT, MMM, pupils reactive to light Cardiac: RRR no m/g/r Lungs: Clear bilaterally, no increased WOB  Abdomen: soft, non-tender, non-distended, normoactive BS Msk: Moves all extremities spontaneously  Ext: Warm, dry, 2+ distal pulses, no edema  Neuro: Alert and oriented, speech normal.  Improving left optic field, has some vision in her proximal medial field and now some peripheral vision.  States she can see blurry objects mainly with motion, not able to tell what it is.  No remaining focal neuro deficits  Brief Hospital Course:  Alicia Barker is a 32 year old female with history of Multiple Sclerosis admitted with 24 hours of acute onset, progressively worsening left-eye vision loss concerning for optic neuritis 2/2 MS exacerbation. MRI Brain showed active demyelination of the left optic nerve corresponding with the patient's decreased left visual acuity.  Neurology was consulted, started her on IV solu medrol 1g daily for 5 days. Her vision loss was unchanged with this therapy, thus transitioned to plasma exchange therapy on 12/5. She had a tunneled catheter placed via IR on 12/5 and will continue with 4 additional treatments of PLEX every other day, with her last treatment on 12/13. At discharge, she had some very limited blurry proximal/medial and peripheral vision.    Issues for Follow  Up:  1. Please follow up with hopeful improvement of vision.  2. Patient will be finishing PLEX therapy on 12/13. Order placed for IR to schedule an appointment for tunnel catheter removal following completion. Please ensure she has her catheter removed.   3. She is to follow up with Neurology, Dr. Epimenio Foot, on 12/11.   Significant Procedures:   12/5: Tunneled catheter placement. 12/5: First PLEX treatment, tolerated well.   Significant Labs and Imaging:  Recent Labs  Lab 07/15/18 0325 07/16/18 0500 07/16/18 1456 07/18/18 1126 07/18/18 1142  WBC 17.2* 10.4  --  10.0  --   HGB 11.0* 11.6* 11.9* 10.6* 10.5*  HCT 32.9* 35.2* 35.0* 33.3* 31.0*  PLT 254 237  --  222  --    Recent Labs  Lab 07/12/18 0404 07/13/18 0339 07/16/18 1456 07/18/18 1126 07/18/18 1142  NA 137 137 137 136 143  K 4.2 3.8 3.7 3.8 2.1*  CL 104 104 100 103 112*  CO2 23 29  --  25  --   GLUCOSE 135* 143* 71 79 77  BUN 12 12 17 13 8   CREATININE 0.79 0.71 0.60 0.56 0.30*  CALCIUM 8.9 8.6*  --  8.5*  --   ALKPHOS  --   --   --  30*  --   AST  --   --   --  15  --   ALT  --   --   --  18  --   ALBUMIN  --   --   --  3.5  --      Results/Tests Pending at Time of Discharge: none  Discharge Medications:  Allergies as of 07/18/2018  Reactions   Penicillins Anaphylaxis, Shortness Of Breath, Swelling   Has patient had a PCN reaction causing immediate rash, facial/tongue/throat swelling, SOB or lightheadedness with hypotension: YES Has patient had a PCN reaction causing severe rash involving mucus membranes or skin necrosis: NO Has patient had a PCN reaction that required hospitalization NO Has patient had a PCN reaction occurring within the last 10 years: NO If all of the above answers are "NO", then may proceed with Cephalosporin use.      Medication List    ASK your doctor about these medications   acetaminophen-codeine 300-30 MG tablet Commonly known as:  TYLENOL #3 Take 1 tablet by mouth 3 (three)  times daily.   amphetamine-dextroamphetamine 10 MG tablet Commonly known as:  ADDERALL Take 1 tablet (10 mg total) by mouth 2 (two) times daily.   cyclobenzaprine 5 MG tablet Commonly known as:  FLEXERIL TAKE 1 TABLET BY MOUTH EVERY NIGHT AT BEDTIME. TAKE UP TO THREE TIMES DAILY   ibuprofen 800 MG tablet Commonly known as:  ADVIL,MOTRIN Take 800 mg by mouth as needed.   meclizine 25 MG tablet Commonly known as:  ANTIVERT Take 1 tablet (25 mg total) by mouth 3 (three) times daily as needed for dizziness.   ondansetron 4 MG tablet Commonly known as:  ZOFRAN Take 1 tablet (4 mg total) by mouth as needed for nausea or vomiting.       Discharge Instructions: Please refer to Patient Instructions section of EMR for full details.  Patient was counseled important signs and symptoms that should prompt return to medical care, changes in medications, dietary instructions, activity restrictions, and follow up appointments.   Follow-Up Appointments: Neurology, 12/11 Family Medicine, 12/16   Leticia Penna Wilson, [place] 07/18/2018, 10:41 PM PGY-1, [facility]

## 2018-07-18 NOTE — Progress Notes (Signed)
I-stat resulted rechecked d/t missing glucose results; K=8.5 noted; 2nd check yielded K=2.1; and waiting on Labs resulted; before notifying the MD; Labs results within normal ranges. Pt tolerated TPE well denies pain, n/v. dizzinessof paresthesai; accompanied of the unit by Cristela Felt, Tech to her husband.

## 2018-07-20 ENCOUNTER — Non-Acute Institutional Stay (HOSPITAL_COMMUNITY)
Admission: RE | Admit: 2018-07-20 | Discharge: 2018-07-20 | Disposition: A | Discharge status: Home / Self Care | Payer: Medicaid Other | Source: Ambulatory Visit | Attending: Student in an Organized Health Care Education/Training Program | Admitting: Student in an Organized Health Care Education/Training Program

## 2018-07-20 ENCOUNTER — Other Ambulatory Visit: Payer: Self-pay | Admitting: Neurology

## 2018-07-20 DIAGNOSIS — H469 Unspecified optic neuritis: Secondary | ICD-10-CM | POA: Diagnosis not present

## 2018-07-20 LAB — POCT I-STAT, CHEM 8
BUN: 7 mg/dL (ref 6–20)
CREATININE: 0.6 mg/dL (ref 0.44–1.00)
Calcium, Ion: 1.26 mmol/L (ref 1.15–1.40)
Chloride: 100 mmol/L (ref 98–111)
Glucose, Bld: 121 mg/dL — ABNORMAL HIGH (ref 70–99)
HCT: 31 % — ABNORMAL LOW (ref 36.0–46.0)
Hemoglobin: 10.5 g/dL — ABNORMAL LOW (ref 12.0–15.0)
Potassium: 3.7 mmol/L (ref 3.5–5.1)
Sodium: 137 mmol/L (ref 135–145)
TCO2: 27 mmol/L (ref 22–32)

## 2018-07-20 MED ORDER — HEPARIN SODIUM (PORCINE) 1000 UNIT/ML IJ SOLN: 1000.0000 [IU] | Freq: Once | INTRAMUSCULAR | Status: DC

## 2018-07-20 MED ORDER — ACD FORMULA A 0.73-2.45-2.2 GM/100ML VI SOLN: 500.0000 mL | Status: DC

## 2018-07-20 MED ORDER — HEPARIN SODIUM (PORCINE) 1000 UNIT/ML IJ SOLN
INTRAMUSCULAR | Status: AC
  Filled 2018-07-20: qty 4

## 2018-07-20 MED ORDER — DIPHENHYDRAMINE HCL 25 MG PO CAPS: 25.0000 mg | ORAL_CAPSULE | Freq: Four times a day (QID) | ORAL | Status: DC | PRN

## 2018-07-20 MED ORDER — ACETAMINOPHEN 325 MG PO TABS: 650.0000 mg | ORAL_TABLET | ORAL | Status: DC | PRN

## 2018-07-20 MED ORDER — ACD FORMULA A 0.73-2.45-2.2 GM/100ML VI SOLN
Status: AC
  Administered 2018-07-20: 500 mL
  Filled 2018-07-20: qty 500

## 2018-07-20 MED ORDER — CALCIUM CARBONATE ANTACID 500 MG PO CHEW
2.0000 | CHEWABLE_TABLET | ORAL | Status: DC
  Administered 2018-07-20: 400 mg via ORAL

## 2018-07-20 MED ORDER — SODIUM CHLORIDE 0.9 % IV SOLN
INTRAVENOUS | Status: DC
  Administered 2018-07-20 (×2): via INTRAVENOUS_CENTRAL
  Filled 2018-07-20 (×3): qty 200

## 2018-07-20 MED ORDER — CALCIUM GLUCONATE-NACL 2-0.675 GM/100ML-% IV SOLN
2.0000 g | INTRAVENOUS | Status: AC
  Administered 2018-07-20: 2000 mg via INTRAVENOUS
  Filled 2018-07-20 (×2): qty 100

## 2018-07-20 MED ORDER — CALCIUM CARBONATE ANTACID 500 MG PO CHEW
CHEWABLE_TABLET | ORAL | Status: AC
  Administered 2018-07-20: 400 mg via ORAL
  Filled 2018-07-20: qty 4

## 2018-07-20 MED ORDER — SODIUM CHLORIDE 0.9 % IV SOLN: 2.0000 g | Freq: Once | INTRAVENOUS | Status: DC

## 2018-07-20 MED ORDER — ACETAMINOPHEN-CODEINE #3 300-30 MG PO TABS: 1.0000 | ORAL_TABLET | Freq: Three times a day (TID) | ORAL | 0 refills | Status: DC

## 2018-07-20 NOTE — Telephone Encounter (Signed)
I will call her once Dr. Epimenio Foot has reviewed her chart/fim

## 2018-07-20 NOTE — Telephone Encounter (Signed)
Pt has called back asking that once RN Rushie Goltz has spoken with Dr Epimenio Foot she be given a call back @ [phone], pt also states for work she needs a letter stating she has not been released to return to work yet

## 2018-07-20 NOTE — Progress Notes (Signed)
Pt completed the TPE tx without complications; denies pain, n/v, paresthesia, dizziness or weakness. Patient accompanied of the unit to ED by Reesa Chew , Secretary.

## 2018-07-20 NOTE — Telephone Encounter (Signed)
I printed out Tyl # 3     I'll sign in a few minutes

## 2018-07-20 NOTE — Telephone Encounter (Signed)
I have spoken with pt. and advised Tyl.#3 rx. is  up front [employer]. She verbalized understanding of same. Sts. she needs letter or FMLA forms filled out. I advised her to have her employer fax me FMLA forms to fax# [phone], and Dr. Epimenio Foot will waive the fee to complete these forms. She verbalized understanding of same/fim

## 2018-07-20 NOTE — Telephone Encounter (Signed)
I spoke with pt's sig. other, Royal. He sts. pt. was admitted to the hospital for 10 days, just d/c yesterday, has been getting "dialysis" (plasmapheresis) for severe optic neuritis. Sts. pt's pain is increased; she would like Tyl. #3 rx., or something stronger, to deal with discomfort associated with plasmapheresis. Will ask Dr. Epimenio Foot to review her admission records and rec. plan for pain control/fim

## 2018-07-20 NOTE — Telephone Encounter (Addendum)
Pt is asking for a refill on her acetaminophen-codeine (TYLENOL #3) 300-30 MG tablet [id number] - [place], [place] - [address] Pt is asking for a call from RN Faith to discuss recent [place]

## 2018-07-21 ENCOUNTER — Telehealth (HOSPITAL_COMMUNITY): Payer: Self-pay

## 2018-07-21 NOTE — Telephone Encounter (Signed)
Called to schedule tunneled cath removal, no answer, left vm. AW  

## 2018-07-22 ENCOUNTER — Ambulatory Visit: Payer: Medicaid Other | Admitting: Neurology

## 2018-07-22 ENCOUNTER — Non-Acute Institutional Stay (HOSPITAL_COMMUNITY)
Admission: RE | Admit: 2018-07-22 | Discharge: 2018-07-22 | Disposition: A | Discharge status: Home / Self Care | Payer: Medicaid Other | Source: Ambulatory Visit | Attending: Student in an Organized Health Care Education/Training Program | Admitting: Student in an Organized Health Care Education/Training Program

## 2018-07-22 DIAGNOSIS — G35 Multiple sclerosis: Secondary | ICD-10-CM | POA: Diagnosis not present

## 2018-07-22 LAB — CBC
HCT: 30.6 % — ABNORMAL LOW (ref 36.0–46.0)
Hemoglobin: 9.7 g/dL — ABNORMAL LOW (ref 12.0–15.0)
MCH: 28.3 pg (ref 26.0–34.0)
MCHC: 31.7 g/dL (ref 30.0–36.0)
MCV: 89.2 fL (ref 80.0–100.0)
Platelets: 206 10*3/uL (ref 150–400)
RBC: 3.43 MIL/uL — ABNORMAL LOW (ref 3.87–5.11)
RDW: 14.9 % (ref 11.5–15.5)
WBC: 10.2 10*3/uL (ref 4.0–10.5)
nRBC: 0 % (ref 0.0–0.2)

## 2018-07-22 LAB — COMPREHENSIVE METABOLIC PANEL
ALT: 25 U/L (ref 0–44)
AST: 21 U/L (ref 15–41)
Albumin: 4.1 g/dL (ref 3.5–5.0)
Alkaline Phosphatase: 32 U/L — ABNORMAL LOW (ref 38–126)
Anion gap: 8 (ref 5–15)
BUN: 10 mg/dL (ref 6–20)
CALCIUM: 9 mg/dL (ref 8.9–10.3)
CHLORIDE: 104 mmol/L (ref 98–111)
CO2: 25 mmol/L (ref 22–32)
Creatinine, Ser: 0.64 mg/dL (ref 0.44–1.00)
GFR calc Af Amer: >60 mL/min (ref >60)
GFR calc non Af Amer: >60 mL/min (ref >60)
Glucose, Bld: 95 mg/dL (ref 70–99)
Potassium: 3.9 mmol/L (ref 3.5–5.1)
Sodium: 137 mmol/L (ref 135–145)
Total Bilirubin: 0.7 mg/dL (ref 0.3–1.2)
Total Protein: 5.6 g/dL — ABNORMAL LOW (ref 6.5–8.1)

## 2018-07-22 LAB — POCT I-STAT, CHEM 8
BUN: 11 mg/dL (ref 6–20)
CREATININE: 0.5 mg/dL (ref 0.44–1.00)
Calcium, Ion: 1.23 mmol/L (ref 1.15–1.40)
Chloride: 102 mmol/L (ref 98–111)
Glucose, Bld: 90 mg/dL (ref 70–99)
HCT: 30 % — ABNORMAL LOW (ref 36.0–46.0)
Hemoglobin: 10.2 g/dL — ABNORMAL LOW (ref 12.0–15.0)
Potassium: 3.9 mmol/L (ref 3.5–5.1)
Sodium: 138 mmol/L (ref 135–145)
TCO2: 26 mmol/L (ref 22–32)

## 2018-07-22 MED ORDER — DIPHENHYDRAMINE HCL 25 MG PO CAPS: 25.0000 mg | ORAL_CAPSULE | Freq: Four times a day (QID) | ORAL | Status: DC | PRN

## 2018-07-22 MED ORDER — SODIUM CHLORIDE 0.9 % IV SOLN
INTRAVENOUS | Status: AC
  Administered 2018-07-22 (×3): via INTRAVENOUS_CENTRAL
  Filled 2018-07-22 (×3): qty 200

## 2018-07-22 MED ORDER — ACD FORMULA A 0.73-2.45-2.2 GM/100ML VI SOLN
Status: AC
  Administered 2018-07-22: 500 mL via INTRAVENOUS
  Filled 2018-07-22: qty 500

## 2018-07-22 MED ORDER — CALCIUM GLUCONATE-NACL 2-0.675 GM/100ML-% IV SOLN
2.0000 g | INTRAVENOUS | Status: AC
  Administered 2018-07-22: 2000 mg via INTRAVENOUS
  Filled 2018-07-22: qty 100

## 2018-07-22 MED ORDER — HEPARIN SODIUM (PORCINE) 1000 UNIT/ML IJ SOLN
INTRAMUSCULAR | Status: AC
  Administered 2018-07-22: 3200 [IU]
  Filled 2018-07-22: qty 4

## 2018-07-22 MED ORDER — CALCIUM CARBONATE ANTACID 500 MG PO CHEW
CHEWABLE_TABLET | ORAL | Status: AC
  Administered 2018-07-22: 400 mg via ORAL
  Filled 2018-07-22: qty 4

## 2018-07-22 MED ORDER — SODIUM CHLORIDE 0.9 % IV SOLN
2.0000 g | Freq: Once | INTRAVENOUS | Status: DC
  Filled 2018-07-22: qty 20

## 2018-07-22 MED ORDER — ACD FORMULA A 0.73-2.45-2.2 GM/100ML VI SOLN
500.0000 mL | Status: DC
  Administered 2018-07-22: 500 mL via INTRAVENOUS

## 2018-07-22 MED ORDER — CALCIUM CARBONATE ANTACID 500 MG PO CHEW
2.0000 | CHEWABLE_TABLET | ORAL | Status: AC
  Administered 2018-07-22 (×2): 400 mg via ORAL

## 2018-07-22 MED ORDER — ACETAMINOPHEN 325 MG PO TABS: 650.0000 mg | ORAL_TABLET | ORAL | Status: DC | PRN

## 2018-07-22 MED ORDER — HEPARIN SODIUM (PORCINE) 1000 UNIT/ML IJ SOLN
1000.0000 [IU] | Freq: Once | INTRAMUSCULAR | Status: AC
  Administered 2018-07-22: 3200 [IU]

## 2018-07-22 NOTE — Progress Notes (Signed)
tx completed without complications; pt denies pain, n/v, weakness, neurologically intact;or dizziness; pt accompanied to the main entrance by Reesa Chew;

## 2018-07-23 ENCOUNTER — Telehealth: Payer: Self-pay | Admitting: *Deleted

## 2018-07-23 DIAGNOSIS — G35 Multiple sclerosis: Secondary | ICD-10-CM | POA: Diagnosis not present

## 2018-07-23 NOTE — Telephone Encounter (Signed)
PA for Tylenol #3 completed, faxed to [facility], fax# [phone]/fim

## 2018-07-24 ENCOUNTER — Non-Acute Institutional Stay (HOSPITAL_COMMUNITY)
Admission: RE | Admit: 2018-07-24 | Discharge: 2018-07-24 | Disposition: A | Discharge status: Home / Self Care | Payer: Medicaid Other | Source: Ambulatory Visit | Attending: Student in an Organized Health Care Education/Training Program | Admitting: Student in an Organized Health Care Education/Training Program

## 2018-07-24 DIAGNOSIS — H469 Unspecified optic neuritis: Secondary | ICD-10-CM | POA: Diagnosis not present

## 2018-07-24 DIAGNOSIS — G35 Multiple sclerosis: Secondary | ICD-10-CM | POA: Insufficient documentation

## 2018-07-24 LAB — CBC
HCT: 30.4 % — ABNORMAL LOW (ref 36.0–46.0)
Hemoglobin: 9.4 g/dL — ABNORMAL LOW (ref 12.0–15.0)
MCH: 28.1 pg (ref 26.0–34.0)
MCHC: 30.9 g/dL (ref 30.0–36.0)
MCV: 91 fL (ref 80.0–100.0)
PLATELETS: 195 10*3/uL (ref 150–400)
RBC: 3.34 MIL/uL — ABNORMAL LOW (ref 3.87–5.11)
RDW: 15.7 % — ABNORMAL HIGH (ref 11.5–15.5)
WBC: 8 10*3/uL (ref 4.0–10.5)
nRBC: 0 % (ref 0.0–0.2)

## 2018-07-24 MED ORDER — SODIUM CHLORIDE 0.9 % IV SOLN
INTRAVENOUS | Status: AC
  Administered 2018-07-24 (×3): via INTRAVENOUS_CENTRAL
  Filled 2018-07-24: qty 150
  Filled 2018-07-24 (×2): qty 200

## 2018-07-24 MED ORDER — CALCIUM CARBONATE ANTACID 500 MG PO CHEW
CHEWABLE_TABLET | ORAL | Status: AC
  Administered 2018-07-24: 400 mg
  Filled 2018-07-24: qty 2

## 2018-07-24 MED ORDER — ACD FORMULA A 0.73-2.45-2.2 GM/100ML VI SOLN
Status: AC
  Filled 2018-07-24: qty 500

## 2018-07-24 MED ORDER — HEPARIN SODIUM (PORCINE) 1000 UNIT/ML IJ SOLN
1000.0000 [IU] | Freq: Once | INTRAMUSCULAR | Status: AC
  Administered 2018-07-24: 1000 [IU]

## 2018-07-24 MED ORDER — ACD FORMULA A 0.73-2.45-2.2 GM/100ML VI SOLN
500.0000 mL | Status: DC
  Administered 2018-07-24: 500 mL via INTRAVENOUS

## 2018-07-24 MED ORDER — CALCIUM CARBONATE ANTACID 500 MG PO CHEW: 2.0000 | CHEWABLE_TABLET | ORAL | Status: DC

## 2018-07-24 MED ORDER — SODIUM CHLORIDE 0.9 % IV SOLN
4.0000 g | Freq: Once | INTRAVENOUS | Status: AC
  Administered 2018-07-24: 4 g via INTRAVENOUS
  Filled 2018-07-24: qty 40

## 2018-07-24 MED ORDER — ACETAMINOPHEN 325 MG PO TABS: 650.0000 mg | ORAL_TABLET | ORAL | Status: DC | PRN

## 2018-07-24 MED ORDER — DIPHENHYDRAMINE HCL 25 MG PO CAPS: 25.0000 mg | ORAL_CAPSULE | Freq: Four times a day (QID) | ORAL | Status: DC | PRN

## 2018-07-24 MED ORDER — HEPARIN SODIUM (PORCINE) 1000 UNIT/ML IJ SOLN
INTRAMUSCULAR | Status: AC
  Administered 2018-07-24: 1000 [IU]
  Filled 2018-07-24: qty 4

## 2018-07-24 NOTE — Progress Notes (Signed)
Tolerated treatment with no issues, vss, stable post treatment, ambulatory, Discharge to home.

## 2018-07-27 ENCOUNTER — Ambulatory Visit (INDEPENDENT_AMBULATORY_CARE_PROVIDER_SITE_OTHER): Payer: Medicaid Other | Admitting: Family Medicine

## 2018-07-27 ENCOUNTER — Other Ambulatory Visit: Payer: Self-pay

## 2018-07-27 VITALS — BP 100/50 | HR 75 | Temp 98.3°F | Ht 64.0 in | Wt 209.4 lb

## 2018-07-27 DIAGNOSIS — G35 Multiple sclerosis: Secondary | ICD-10-CM | POA: Diagnosis not present

## 2018-07-27 DIAGNOSIS — Z5900 Homelessness unspecified: Secondary | ICD-10-CM

## 2018-07-27 DIAGNOSIS — Z452 Encounter for adjustment and management of vascular access device: Secondary | ICD-10-CM | POA: Diagnosis not present

## 2018-07-27 DIAGNOSIS — H539 Unspecified visual disturbance: Secondary | ICD-10-CM

## 2018-07-27 DIAGNOSIS — H469 Unspecified optic neuritis: Secondary | ICD-10-CM | POA: Diagnosis not present

## 2018-07-27 MED ORDER — IBUPROFEN 800 MG PO TABS: 800.0000 mg | ORAL_TABLET | ORAL | 0 refills | Status: DC | PRN

## 2018-07-27 NOTE — Patient Instructions (Signed)
It was a pleasure to see you today! Thank you for choosing [facility] for your primary care. Alicia Barker was seen for vision loss due to optic neuritis.   I am placing an urgent referral to neurology to follow-up for vision loss and ophthalmology.  They will be contacting you with follow-up times. I am placing referral to social work to help you with access to resources. Placing referral to interventional radiology to remove your catheter. Follow-up in 1 month just to ensure that things of occured.  Best,  Thomes Dinning, MD, MS FAMILY MEDICINE RESIDENT - PGY2 07/27/2018 8:58 AM

## 2018-07-27 NOTE — Progress Notes (Addendum)
   Established Patient Office Visit  Subjective:  Patient ID: Alicia Barker, female    DOB: 17-Sep-1985  Age: 32 y.o. MRN: [id number]  CC: vision loss  HPI [name] presents for follow-up on vision loss of the left eye due to optic neuritis.  Patient was discharged from the hospital 11/29.  She is received IV steroids and 5 doses of plasma exchange via tunneled catheter.  This ended on 12/13.  Patient endorses that she has some improvement in vision although it is slow to return.  She also has endorsing ongoing pain in left eye and temple.  Visual acuity completely black in the left upper field and has color distortion in the remaining three quarters.  Also has headache in that left eye.  It is distorting for her to look around.  Nothing makes vision better or worse.  Not worse at any particular time.  It is always bad.  ROS Review of Systems  Constitutional: Negative for fatigue and fever.      Objective:    Physical Exam  Constitutional: She appears well-developed. No distress.  HENT:  Head: Normocephalic and atraumatic.  Eyes: Pupils are equal, round, and reactive to light. Conjunctivae and EOM are normal.  Left upper field of left eyes no vision.  Other fields remain intact.  Patient reports color change in lower portion of left eye vision.  Neck: No JVD present.  Cardiovascular: Normal rate.  Pulmonary/Chest: Effort normal.    Abdominal: She exhibits no distension.  Neurological: She is alert.  Psychiatric: She has a normal mood and affect. Her speech is normal.    BP (!) 100/50   Pulse 75   Temp 98.3 F (36.8 C) (Oral)   Ht 5\' 4"  (1.626 m)   Wt 209 lb 6 oz (95 kg)   LMP 07/13/2018   SpO2 98%   BMI 35.94 kg/m  Wt Readings from Last 3 Encounters:  07/27/18 209 lb 6 oz (95 kg)  07/17/18 210 lb 1.6 oz (95.3 kg)  03/26/18 200 lb (90.7 kg)     Assessment & Plan:  Presenting to follow-up on optic neuritis Still has continued vision loss.  Status post  steroids and plasma exchange. Recommend urgent referral to neurology and ophthalmology to try to salvage vision in left eye. Referral to nephrology for removal of tunneled catheter Referral to social work to provide services Ibuprofen for pain as needed Problem List Items Addressed This Visit      Other   Homeless family   Relevant Orders   Ambulatory referral to Social Work    Other Visit Diagnoses    Vision changes    -  Primary   Relevant Orders   Ambulatory referral to Neurology   Ambulatory referral to Ophthalmology   Optic neuritis       Relevant Orders   Ambulatory referral to Neurology   Ambulatory referral to Ophthalmology   Encounter for removal of vascular catheter       Relevant Orders   Ambulatory referral to Interventional Radiology      Meds ordered this encounter  Medications  . ibuprofen (ADVIL,MOTRIN) 800 MG tablet    Sig: Take 1 tablet (800 mg total) by mouth as needed.    Dispense:  30 tablet    Refill:  0    Follow-up: Return in about 4 weeks (around 08/24/2018) for f/u vision loss.    Garnette Gunner, MD

## 2018-07-28 ENCOUNTER — Telehealth: Payer: Self-pay

## 2018-07-28 NOTE — Telephone Encounter (Signed)
Patient called to check on status of referrals. Call back is [phone]  Ples Specter, RN [facility] RN)

## 2018-07-29 ENCOUNTER — Encounter: Payer: Self-pay | Admitting: Neurology

## 2018-07-29 NOTE — Telephone Encounter (Signed)
Patient called again. Transferred her to [employer]. Ples Specter, RN [facility] RN)

## 2018-07-30 ENCOUNTER — Telehealth: Payer: Self-pay | Admitting: *Deleted

## 2018-07-30 DIAGNOSIS — G35 Multiple sclerosis: Secondary | ICD-10-CM | POA: Diagnosis not present

## 2018-07-30 NOTE — Telephone Encounter (Signed)
Spoke with [facility] nurse. Patient scheduled in interventional radiology for 12/24 for catheter removal. HH nurse will change dressing over site to last until appt to remove.  Ples Specter, RN [facility] RN)

## 2018-07-30 NOTE — Telephone Encounter (Signed)
Took call from phone staff. Spoke with Amy, RN from [facility]. She states this is the first day she is working with her and noticed she still had PICC line in place. Pt had this placed outpt setting to receive plasmapheresis. Amy, RN does not have access to Epic. I reviewed her chart and appears discharge instructions stated referral placed to have it removed. I checked referral and referral was placed to IR on 07/27/18. Amy is going to contact PCP back to see what next steps should be to get this removed. Pt states she has not heard back from anyone. She will call us back if anything further needed.

## 2018-08-03 ENCOUNTER — Other Ambulatory Visit: Payer: Self-pay | Admitting: Radiology

## 2018-08-04 ENCOUNTER — Other Ambulatory Visit: Payer: Self-pay | Admitting: Radiology

## 2018-08-04 ENCOUNTER — Ambulatory Visit (HOSPITAL_COMMUNITY)
Admission: RE | Admit: 2018-08-04 | Discharge: 2018-08-04 | Disposition: A | Discharge status: Home / Self Care | Payer: Medicaid Other | Source: Ambulatory Visit | Attending: Family Medicine | Admitting: Family Medicine

## 2018-08-04 ENCOUNTER — Encounter (HOSPITAL_COMMUNITY): Payer: Self-pay | Admitting: Interventional Radiology

## 2018-08-04 ENCOUNTER — Other Ambulatory Visit: Payer: Self-pay | Admitting: Student in an Organized Health Care Education/Training Program

## 2018-08-04 DIAGNOSIS — Z452 Encounter for adjustment and management of vascular access device: Secondary | ICD-10-CM | POA: Diagnosis not present

## 2018-08-04 DIAGNOSIS — G35 Multiple sclerosis: Secondary | ICD-10-CM | POA: Diagnosis not present

## 2018-08-04 HISTORY — PX: IR REMOVAL TUN CV CATH W/O FL: IMG2289

## 2018-08-04 MED ORDER — CHLORHEXIDINE GLUCONATE 4 % EX LIQD
CUTANEOUS | Status: AC
  Filled 2018-08-04: qty 15

## 2018-08-04 MED ORDER — LIDOCAINE HCL 1 % IJ SOLN
INTRAMUSCULAR | Status: AC
  Filled 2018-08-04: qty 20

## 2018-08-04 MED ORDER — VANCOMYCIN HCL 10 G IV SOLR: 1000.0000 mg | Freq: Once | INTRAVENOUS | Status: DC

## 2018-08-04 NOTE — Procedures (Signed)
MS  S/p plasmapheresis cath removal  No comp Stable

## 2018-08-06 ENCOUNTER — Ambulatory Visit: Payer: Medicaid Other | Admitting: Neurology

## 2018-08-06 DIAGNOSIS — G35 Multiple sclerosis: Secondary | ICD-10-CM | POA: Diagnosis not present

## 2018-08-07 ENCOUNTER — Encounter: Payer: Self-pay | Admitting: Neurology

## 2018-08-07 DIAGNOSIS — G35 Multiple sclerosis: Secondary | ICD-10-CM | POA: Diagnosis not present

## 2018-08-19 DIAGNOSIS — G35 Multiple sclerosis: Secondary | ICD-10-CM | POA: Diagnosis not present

## 2018-08-20 ENCOUNTER — Ambulatory Visit: Payer: Medicaid Other | Admitting: Neurology

## 2018-08-20 ENCOUNTER — Other Ambulatory Visit: Payer: Self-pay | Admitting: Neurology

## 2018-08-20 DIAGNOSIS — G35 Multiple sclerosis: Secondary | ICD-10-CM | POA: Diagnosis not present

## 2018-08-20 MED ORDER — SERTRALINE HCL 100 MG PO TABS: 100.0000 mg | ORAL_TABLET | Freq: Every day | ORAL | 3 refills | Status: DC

## 2018-08-20 MED ORDER — ACETAMINOPHEN-CODEINE #3 300-30 MG PO TABS: 1.0000 | ORAL_TABLET | Freq: Three times a day (TID) | ORAL | 0 refills | Status: DC

## 2018-08-20 NOTE — Telephone Encounter (Signed)
I checked drug registry. She last refilled Tylenol #3 07/24/18 #90. Not receiving from other MD's. Last seen 01/19/2018 and next f/u 10/22/2018.

## 2018-08-20 NOTE — Telephone Encounter (Signed)
Patient requesting refill of acetaminophen-codeine (TYLENOL #3) 300-30 MG tablet to be sent [facility] on [address] [place]. She is also requesting refiil of depression medication but doesn't remember the name of medication. Please call her to let her know.

## 2018-08-20 NOTE — Telephone Encounter (Signed)
I spoke with Dr. Epimenio Foot- ok to have her try zoloft 100mg  daily for depression.

## 2018-08-20 NOTE — Telephone Encounter (Signed)
Called pt to get some more info. I see in past Dr. Epimenio Foot has rx'd celexa. She verified this was what she was taking but states it did not help. She is wanting a different medication to try. Advised I will speak with Dr. Epimenio Foot and call back to let her know. She verbalized understanding.

## 2018-08-20 NOTE — Addendum Note (Signed)
Addended by: Hillis Range on: 08/20/2018 01:45 PM   Modules accepted: Orders

## 2018-08-20 NOTE — Telephone Encounter (Signed)
Called and spoke with pt. She is agreeable to try zoloft. rx sent to pharmacy.

## 2018-08-20 NOTE — Telephone Encounter (Signed)
Per [employer], Tyl. #3 PA was approved for dates 07/23/18-01/19/19. PA# [id number]. Interaction ID: [id number]

## 2018-08-27 DIAGNOSIS — H538 Other visual disturbances: Secondary | ICD-10-CM | POA: Diagnosis not present

## 2018-08-27 DIAGNOSIS — G35 Multiple sclerosis: Secondary | ICD-10-CM | POA: Diagnosis not present

## 2018-08-27 DIAGNOSIS — H5450 Low vision, one eye, unspecified eye: Secondary | ICD-10-CM | POA: Diagnosis not present

## 2018-08-27 DIAGNOSIS — H468 Other optic neuritis: Secondary | ICD-10-CM | POA: Diagnosis not present

## 2018-08-28 ENCOUNTER — Other Ambulatory Visit: Payer: Self-pay | Admitting: Family Medicine

## 2018-08-28 DIAGNOSIS — G35 Multiple sclerosis: Secondary | ICD-10-CM | POA: Diagnosis not present

## 2018-09-24 ENCOUNTER — Other Ambulatory Visit: Payer: Self-pay | Admitting: Neurology

## 2018-09-24 MED ORDER — AMPHETAMINE-DEXTROAMPHETAMINE 10 MG PO TABS: 10.0000 mg | ORAL_TABLET | Freq: Two times a day (BID) | ORAL | 0 refills | Status: DC

## 2018-09-24 MED ORDER — ACETAMINOPHEN-CODEINE #3 300-30 MG PO TABS: 1.0000 | ORAL_TABLET | Freq: Three times a day (TID) | ORAL | 0 refills | Status: DC

## 2018-09-24 NOTE — Telephone Encounter (Signed)
Pt is needing a refill for her amphetamine-dextroamphetamine (ADDERALL) 10 MG tablet and her acetaminophen-codeine (TYLENOL #3) 300-30 MG tablet sent to [facility] at [address]

## 2018-09-24 NOTE — Telephone Encounter (Signed)
I reviewed patient's chart. She was last seen 01/19/18. Next f/u scheduled for 10/22/18.  She cx f/u 07/22/18 d/t conflict w/ another appt and cx 08/06/18 d/t having stomach bug.   Checked drug registry. She last refilled adderall 06/02/18 #60 and tylenol #3 08/20/18 #90.  I will have to discuss with Dr. Epimenio Foot first to see if he is ok to refill.

## 2018-10-05 ENCOUNTER — Ambulatory Visit: Payer: Medicaid Other | Admitting: Neurology

## 2018-10-20 ENCOUNTER — Other Ambulatory Visit: Payer: Self-pay | Admitting: Neurology

## 2018-10-22 ENCOUNTER — Other Ambulatory Visit: Payer: Self-pay | Admitting: *Deleted

## 2018-10-22 ENCOUNTER — Other Ambulatory Visit: Payer: Self-pay

## 2018-10-22 ENCOUNTER — Encounter: Payer: Self-pay | Admitting: Neurology

## 2018-10-22 ENCOUNTER — Ambulatory Visit (INDEPENDENT_AMBULATORY_CARE_PROVIDER_SITE_OTHER): Payer: Medicaid Other | Admitting: Neurology

## 2018-10-22 VITALS — BP 113/67 | HR 78 | Resp 16 | Ht 64.0 in | Wt 222.0 lb

## 2018-10-22 DIAGNOSIS — F32A Depression, unspecified: Secondary | ICD-10-CM

## 2018-10-22 DIAGNOSIS — H469 Unspecified optic neuritis: Secondary | ICD-10-CM

## 2018-10-22 DIAGNOSIS — F329 Major depressive disorder, single episode, unspecified: Secondary | ICD-10-CM | POA: Diagnosis not present

## 2018-10-22 DIAGNOSIS — Z79899 Other long term (current) drug therapy: Secondary | ICD-10-CM | POA: Diagnosis not present

## 2018-10-22 DIAGNOSIS — R350 Frequency of micturition: Secondary | ICD-10-CM

## 2018-10-22 DIAGNOSIS — R269 Unspecified abnormalities of gait and mobility: Secondary | ICD-10-CM

## 2018-10-22 DIAGNOSIS — R200 Anesthesia of skin: Secondary | ICD-10-CM | POA: Diagnosis not present

## 2018-10-22 DIAGNOSIS — G35 Multiple sclerosis: Secondary | ICD-10-CM

## 2018-10-22 DIAGNOSIS — R5383 Other fatigue: Secondary | ICD-10-CM

## 2018-10-22 MED ORDER — AMPHETAMINE-DEXTROAMPHETAMINE 10 MG PO TABS: 10.0000 mg | ORAL_TABLET | Freq: Two times a day (BID) | ORAL | 0 refills | Status: DC

## 2018-10-22 MED ORDER — ACETAMINOPHEN-CODEINE #3 300-30 MG PO TABS: 1.0000 | ORAL_TABLET | Freq: Three times a day (TID) | ORAL | 0 refills | Status: DC

## 2018-10-22 MED ORDER — SERTRALINE HCL 100 MG PO TABS: 150.0000 mg | ORAL_TABLET | Freq: Every day | ORAL | 3 refills | Status: DC

## 2018-10-22 NOTE — Progress Notes (Signed)
[facility]  PATIENT: Alicia Barker DOB: 05-08-1986  REFERRING DOCTOR OR PCP:  PCP is Francoise Ceo SOURCE: patient, records from hospital, MRI / lab reports, MRI images on PACS  _________________________________   HISTORICAL  CHIEF COMPLAINT:  Chief Complaint  Patient presents with   Multiple Sclerosis    Rm. 12.  Sts. she continues to tolerate Ocrevus well.  Last infusion was in October./fim    HISTORY OF PRESENT ILLNESS:  Alicia Barker is a 33 y.o. woman who was diagnosed with MS in March 2017.      Update 10/22/2018: She was admitted to [facility] 07/10/2018 due to left optic neuritis.  She received IV Solu-Medrol but had no improvement and then had plasmapheresis.  Unfortunately the improvement has been mild.  Currently, she can see some shapes and hand waving out of the left eye but cannot count fingers.  There is no color vision on the left.  She also had more difficulty with her gait and some clumsiness in the left arm and leg around the same time of the.  She feels her gait has improved close to the baseline before her heart lesion.  She has some bladder urgency.  I reviewed the MRI of the brain from 07/10/2018.  It showed a couple new lesions compared to 2017 (before she was on Ocrevus) but these were not large enough to cause her symptoms.  There was no new MRI of the spine.  She does note more difficulties with depression.  Earlier this year she has some suicidal ideation but did not have any suicidal plan.  She is on sertraline 100 mg daily.  We discussed seeing counseling.  Additionally, she has more difficulty with fatigue.  She sleeps okay most nights.  She has not noted any change in cognition.  Update 01/19/2018:   She feels her MS is stable.  She denies any exacerbations.  She is on Ocrevus.  She tolerates it well.  Her last infusion was April 2019.  At the last visit we checked IgG/IgM and they were normal.  She notes some  difficulty with her gait and some mild weakness in her legs.  The weakness is worse on the right than the left.  When she walks a while she will have a right foot drop.  She also notes some right leg numbness and tingling.  She is able to walk without a cane.  Vision is fine.  She notes less urinary urgency incontinence on oxybutynin and Myrbetriq  She has some fatigue.  She sleeps well most nights.  She has mild depression, somewhat helped by Celexa.   She never did counseling.   She has had some attention deficit helped by Adderall (does not take every day).      She has fewer headaches.  When one occurs, she takes Tyl #3  Update 07/14/2017: She had her last ocrelizumab infusion at the end of July 2018. The next infusion is scheduled for 09/11/2017. She has tolerated the infusions well and has not had any definite exacerbations. Since her large exacerbation in 2017, she continues to have weakness in her legs.   Her right leg feels weaker than her left leg.   She sometimes notes a right foot drop.    She walks without a cane.    She is able to do some part time work.  She notes some right leg numbness and tingling.   She has some urinary incontinenc associated with urge.  Oxybutynin has helped a lot.     Mood is better recently but some days she feels hopeless.    She is still on an antidepressant and feels it helps.      From 03/12/2017 MS:  She switched from Tecfidera to Ocrevus in December 2017/January 2018 and just had her second infusion.    She has no new exacerbation.  She was started on Tecfidera April 2017.    She was tolerating Tecfidera well but in August had more difficulty with balance and slurred speech.    MRI 03/25/2016 showed several new enhancing lesions c/w active demyelination   She received IV steroids and started Ocrevus, first dose in December.  .     Gait/strength/sensation:   Since the large exacerbation in 2017, She reports reports left > right leg weakness and clumsiness.    Gait is unsteady.   She has fallen a few times.  She uses a cane sometimes but no longer needs a walker. The right leg was weaker before the large exacerbation.   No hand numbness.    The left leg has painful dysesthesias with a stabbing quality.     Gabapentin had not helped in the past    Bladder/bowel: She has more urinary frequency and urgency with occasional urinary incontinence. Oxybutynin has not helped much.   She has rare bowel incontinence.     Vision/vertigo: She notes some visual blurring since last year.   She has some diplopia but this is better.  Fatigue/sleep: She notes a lot of fatigue, physical > mental.   Sleep is worse with more trouble staying asleep.   Her insomnia improved with nighttime cyclobenzaprine earlier this year but now she has more again.  Mood/cognition: She is having some depression and anxiety that worsened last year and has persisted despite escitalopram.     She gets irritable easily and has a short fuse.     She also reports apathy and poor sleep.  Cognitive processing speed appears to be slower.  HA:   She has pain in the temples and the neck since Monday.   Pain worsens when she stands or moves and is best when she is still.   She denies N/V but has photophobia and phonophobia.   This is her worst HA in years but similar to ones she had many years ago.   Tylenol #3 and Ibuprofen have not helped.     MS History:   In early March, 2017, she had the onset of slurred speech and leg weakness and ataxia.    A head CT was reportedly normal.    Over the next couple weeks, she had right worse than left visual acuity issues, more numbness and more clumsiness.        Her gait became unsteady.     She also noted mid back pain.    She went to the ER and was diagnosed with an inner ear problem.   She went back to ER 11/15/15 and was found to have an abnormal MRI consistent with MS.    MRI of the spine also showed additional plaques.     She received 5 days of IV Steroids and  noticed an improvement with improved gait but continued numbness.    I have reviewed the MRIs of the brain and spine performed for 12/30/2015 and 11/17/2015. The MRI of the brain with and without contrast shows multiple T2/FLAIR hyperintense foci, many in the periventricular white matter.    Another focus  is the pons (with mild enhancement, better seen on c-spine MRI).Marland Kitchen 7 foci enhanced after gadolinium administration imply more recent MS plaques.    There are at least 2 small T2 hyperintense foci within the cervical spine, one of which appears to enhance slightly.    Another enhancing focus is noted within the thoracic spine adjacent to T3-T4.   She started Tecfidera after I first saw her 11/28/2015.   She had a significant relapse and was switched over to ocrelizumab if her first dose around January or February 2018.                                                                                       REVIEW OF SYSTEMS: Constitutional: No fevers, chills, sweats, or change in appetite.  She reports fatigue and poor sleep  Eyes: mild right visual changes, double vision, eye pain Ear, nose and throat: No hearing loss, ear pain, nasal congestion, sore throat Cardiovascular: No chest pain, palpitations Respiratory: No shortness of breath at rest or with exertion.   No wheezes GastrointestinaI: No nausea, vomiting, diarrhea, abdominal pain, fecal incontinence Genitourinary: No dysuria, urinary retention.   She has some urgency and frequency..  She has some nocturia. Musculoskeletal: Reports neck pain, back pain Integumentary: No rash, pruritus, skin lesions Neurological: as above Psychiatric: Notes depression > anxiety.    Endocrine: No palpitations, diaphoresis, change in appetite, change in weigh or increased thirst Hematologic/Lymphatic: No anemia, purpura, petechiae. Allergic/Immunologic: No itchy/runny eyes, nasal congestion, recent allergic reactions, rashes  ALLERGIES: Allergies  Allergen  Reactions   Penicillins Anaphylaxis, Shortness Of Breath and Swelling    Has patient had a PCN reaction causing immediate rash, facial/tongue/throat swelling, SOB or lightheadedness with hypotension: YES Has patient had a PCN reaction causing severe rash involving mucus membranes or skin necrosis: NO Has patient had a PCN reaction that required hospitalization NO Has patient had a PCN reaction occurring within the last 10 years: NO If all of the above answers are "NO", then may proceed with Cephalosporin use.    HOME MEDICATIONS:  Current Outpatient Medications:    acetaminophen-codeine (TYLENOL #3) 300-30 MG tablet, Take 1 tablet by mouth 3 (three) times daily., Disp: 90 tablet, Rfl: 0   amphetamine-dextroamphetamine (ADDERALL) 10 MG tablet, Take 1 tablet (10 mg total) by mouth 2 (two) times daily., Disp: 60 tablet, Rfl: 0   cyclobenzaprine (FLEXERIL) 5 MG tablet, TAKE 1 TABLET BY MOUTH EVERY NIGHT AT BEDTIME. TAKE UP TO THREE TIMES DAILY (Patient taking differently: Take 5 mg by mouth as needed for muscle spasms. ), Disp: 90 tablet, Rfl: 11   ibuprofen (ADVIL,MOTRIN) 800 MG tablet, TAKE 1 TABLET BY MOUTH AS NEEDED, Disp: 30 tablet, Rfl: 0   meclizine (ANTIVERT) 25 MG tablet, Take 1 tablet (25 mg total) by mouth 3 (three) times daily as needed for dizziness., Disp: 30 tablet, Rfl: 0   ocrelizumab 600 mg in sodium chloride 0.9 % 500 mL, Inject 600 mg into the vein every 6 (six) months., Disp: , Rfl:    ondansetron (ZOFRAN) 4 MG tablet, Take 1 tablet (4 mg total) by mouth as needed for nausea or vomiting., Disp: 3  tablet, Rfl: 0   sertraline (ZOLOFT) 100 MG tablet, Take 1.5 tablets (150 mg total) by mouth daily., Disp: 45 tablet, Rfl: 3 No current facility-administered medications for this visit.   Facility-Administered Medications Ordered in Other Visits:    vancomycin (VANCOCIN) 1,000 mg in sodium chloride 0.9 % 500 mL IVPB, 1,000 mg, Intravenous, Once, [place] Her[place],  PA-C  PAST MEDICAL HISTORY: Past Medical History:  Diagnosis Date   Depression    no meds currently   MS (multiple sclerosis) (HCC)    SVD (spontaneous vaginal delivery)    x 5    PAST SURGICAL HISTORY: Past Surgical History:  Procedure Laterality Date   IR FLUORO GUIDE CV LINE RIGHT  07/16/2018   IR REMOVAL TUN CV CATH W/O FL  08/04/2018   IR US GUIDE VASC ACCESS RIGHT  07/16/2018   LAPAROSCOPIC TUBAL LIGATION Bilateral 01/06/2013   Procedure: LAPAROSCOPIC TUBAL LIGATION;  Surgeon: Kathreen Cosier, MD;  Location: [facility];  Service: Gynecology;  Laterality: Bilateral;   right hand surgery      FAMILY HISTORY: Family History  Problem Relation Age of Onset   Diabetes Mother    Heart disease Mother    Diabetes Father    Multiple sclerosis Cousin     SOCIAL HISTORY:  Social History   Socioeconomic History   Marital status: Married    Spouse name: Not on file   Number of children: Not on file   Years of education: Not on file   Highest education level: Not on file  Occupational History   Not on file  Social Needs   Financial resource strain: Not on file   Food insecurity:    Worry: Not on file    Inability: Not on file   Transportation needs:    Medical: Not on file    Non-medical: Not on file  Tobacco Use   Smoking status: Former Smoker    Packs/day: 0.10    Types: Cigarettes   Smokeless tobacco: Never Used  Substance and Sexual Activity   Alcohol use: No    Alcohol/week: 0.0 standard drinks   Drug use: No   Sexual activity: Yes    Birth control/protection: None  Lifestyle   Physical activity:    Days per week: Not on file    Minutes per session: Not on file   Stress: Not on file  Relationships   Social connections:    Talks on phone: Not on file    Gets together: Not on file    Attends religious service: Not on file    Active member of club or organization: Not on file    Attends meetings of clubs or organizations: Not  on file    Relationship status: Not on file   Intimate partner violence:    Fear of current or ex partner: Not on file    Emotionally abused: Not on file    Physically abused: Not on file    Forced sexual activity: Not on file  Other Topics Concern   Not on file  Social History Narrative   Not on file     PHYSICAL EXAM  Vitals:   10/22/18 1029  BP: 113/67  Pulse: 78  Resp: 16  Weight: 222 lb (100.7 kg)  Height: 5\' 4"  (1.626 m)    Body mass index is 38.11 kg/m.   General: The patient is well-developed and well-nourished and in no acute distress.   She has no rashes.   She has left optic  disc pallor    Neurologic Exam  Mental status: The patient is alert and oriented x 3 at the time of the examination. The patient has apparent normal recent and remote memory, with an apparently normal attention span and concentration ability.   Speech is normal.  Cranial nerves: Extraocular movements are full.  2+ left APD.   Facial strength and sensation is normal. Trapezius and sternocleidomastoid strength is normal. No dysarthria is noted.  The tongue is midline, and the patient has symmetric elevation of the soft palate. No obvious hearing deficits are noted.  Motor:  Muscle bulk is normal.   Muscle tone is normal.  Strength was 5/5 in the arms and the right leg in the proximal left leg but 4+/5 in the left ankle and toe extensors  Sensory:   Vibration sensation and touch sensation is normal and symmetric in the legs. Coordination: Finger-nose-finger is normal. Heel-to-shin is reduced in the legs, worse on the left.   Gait and station: Station is normal.  She has a mild left foot drop and poor tandem.  Romberg is negative.   Reflexes: Deep tendon reflexes are symmetric and normal bilaterally.           DIAGNOSTIC DATA (LABS, IMAGING, TESTING) - I reviewed patient records, labs, notes, testing and imaging myself where available.  Lab Results  Component Value Date   WBC 8.0  07/24/2018   HGB 9.4 (L) 07/24/2018   HCT 30.4 (L) 07/24/2018   MCV 91.0 07/24/2018   PLT 195 07/24/2018      Component Value Date/Time   NA 137 07/22/2018 0852   K 3.9 07/22/2018 0852   CL 104 07/22/2018 0852   CO2 25 07/22/2018 0852   GLUCOSE 95 07/22/2018 0852   BUN 10 07/22/2018 0852   CREATININE 0.64 07/22/2018 0852   CALCIUM 9.0 07/22/2018 0852   PROT 5.6 (L) 07/22/2018 0852   PROT 6.5 04/16/2016 1119   ALBUMIN 4.1 07/22/2018 0852   ALBUMIN 3.9 04/16/2016 1119   AST 21 07/22/2018 0852   ALT 25 07/22/2018 0852   ALKPHOS 32 (L) 07/22/2018 0852   BILITOT 0.7 07/22/2018 0852   BILITOT <0.2 04/16/2016 1119   GFRNONAA >60 07/22/2018 0852   GFRAA >60 07/22/2018 0852       ASSESSMENT AND PLAN  Multiple sclerosis (HCC)  Optic neuritis due to multiple sclerosis (HCC)  Numbness  Urinary frequency  Other fatigue  High risk medication use  Gait disturbance  Depression, unspecified depression type   1.    She will continue ocrelizumab.  If another exacerbation occurs, however, would consider a switch to [place].  I will also check the IgG level for safety and the CD19/20 to make sure that she is getting the expected benefit from ocrelizumab she is JCV antibody high positive so Tysabri would not be a good option.  We need to check an MRI of the cervical spine.  If any acute lesions consider a change from Ocrevus to [place]. 2.    Renew Tylenol 3. 3.    Increase sertraline to 150 mg daily.  Also behavioral health referral for mood disturbance and MS.   4.    Return in 6 months or sooner if there are new or worsening neurologic symptoms. We discussed symptoms of  possible exacerbations.   Aadarsh Cozort A. Epimenio Foot, MD, PhD 10/22/2018, 11:19 AM Certified in Neurology, Clinical Neurophysiology, Sleep Medicine, Pain Medicine and Neuroimaging  [facility] [address], [address] [place], [place] [address] [phone])  [phone]

## 2018-10-22 NOTE — Addendum Note (Signed)
Addended by: Tamera Stands D on: 10/22/2018 02:23 PM   Modules accepted: Orders

## 2018-10-25 LAB — CBC WITH DIFFERENTIAL/PLATELET
Basophils Absolute: 0 10*3/uL (ref 0.0–0.2)
Basos: 0 %
EOS (ABSOLUTE): 0.3 10*3/uL (ref 0.0–0.4)
Eos: 5 %
Hematocrit: 35.1 % (ref 34.0–46.6)
Hemoglobin: 11 g/dL — ABNORMAL LOW (ref 11.1–15.9)
Immature Grans (Abs): 0.2 10*3/uL — ABNORMAL HIGH (ref 0.0–0.1)
Immature Granulocytes: 3 %
Lymphocytes Absolute: 1.4 10*3/uL (ref 0.7–3.1)
Lymphs: 26 %
MCH: 28 pg (ref 26.6–33.0)
MCHC: 31.3 g/dL — ABNORMAL LOW (ref 31.5–35.7)
MCV: 89 fL (ref 79–97)
Monocytes Absolute: 0.5 10*3/uL (ref 0.1–0.9)
Monocytes: 8 %
Neutrophils Absolute: 3.3 10*3/uL (ref 1.4–7.0)
Neutrophils: 58 %
PLATELETS: 276 10*3/uL (ref 150–450)
RBC: 3.93 x10E6/uL (ref 3.77–5.28)
RDW: 14 % (ref 11.7–15.4)
WBC: 5.6 10*3/uL (ref 3.4–10.8)

## 2018-10-25 LAB — IGG, IGA, IGM
IgG (Immunoglobin G), Serum: 955 mg/dL (ref 700–1600)
IgM (Immunoglobulin M), Srm: 61 mg/dL (ref 26–217)
Immunoglobulin A, (IgA) QN, Serum: 143 mg/dL (ref 87–352)

## 2018-10-25 LAB — CD19 AND CD20, FLOW CYTOMETRY
% CD19: 2 %
% CD20: 2 %

## 2018-11-12 ENCOUNTER — Other Ambulatory Visit: Payer: Self-pay | Admitting: Neurology

## 2018-11-12 ENCOUNTER — Telehealth: Payer: Self-pay | Admitting: Neurology

## 2018-11-12 MED ORDER — ACETAMINOPHEN-CODEINE #3 300-30 MG PO TABS: 1.0000 | ORAL_TABLET | Freq: Three times a day (TID) | ORAL | 0 refills | Status: DC

## 2018-11-12 NOTE — Telephone Encounter (Signed)
Pt has called for a refill on her acetaminophen-codeine (TYLENOL #3) 300-30 MG tablet [facility] [phone]

## 2018-11-12 NOTE — Telephone Encounter (Signed)
pt has called for the [facility] suite, call transferred °

## 2018-11-12 NOTE — Telephone Encounter (Signed)
Drug registry checked Tylenol #3 one po TID # 90 last filled 10-23-18.

## 2018-11-12 NOTE — Addendum Note (Signed)
Addended by: Guy Begin on: 11/12/2018 02:23 PM   Modules accepted: Orders

## 2018-11-23 ENCOUNTER — Telehealth: Payer: Self-pay | Admitting: *Deleted

## 2018-11-23 NOTE — Telephone Encounter (Signed)
Called, LVM for pt to call.  [facility] (Liane D, RN) asked me to call her because they have been unable to reach her about her Ocrevus infusion. Pt needs to call [facility] at [phone] option 1 and [employer] at [phone] so she can get her Ocrevus approved.

## 2018-12-08 ENCOUNTER — Telehealth: Payer: Self-pay | Admitting: *Deleted

## 2018-12-08 NOTE — Telephone Encounter (Signed)
Pt need a letter stating  she need to be move to a smaller unit in her apartment complex. Please call [phone].

## 2018-12-08 NOTE — Telephone Encounter (Signed)
I called pt back. She states Dr. Epimenio Foot hand wrote letter at last OV with this request but apartment complex misplaced it. She would like another letter. She will be here tomorrow for infusion at 730am. Advised I will discuss with Dr. Epimenio Foot and we will try to get her the letter before she leaves tomorrow. She verbalized understanding.

## 2018-12-09 ENCOUNTER — Encounter: Payer: Self-pay | Admitting: *Deleted

## 2018-12-09 NOTE — Telephone Encounter (Signed)
I spoke with patient while she was here for infusion. I clarified what she needed for letter. She does not need a smaller unit, this was an error. She needs a letter stating she needs a first floor unit. Advised we will get this to her before she leaves today.

## 2018-12-09 NOTE — Telephone Encounter (Signed)
Printed, letter, waiting on MD signature

## 2018-12-09 NOTE — Telephone Encounter (Signed)
Gave letter to pt.

## 2018-12-15 ENCOUNTER — Other Ambulatory Visit: Payer: Self-pay | Admitting: Neurology

## 2018-12-16 ENCOUNTER — Telehealth: Payer: Self-pay | Admitting: Neurology

## 2018-12-16 NOTE — Telephone Encounter (Signed)
I called pt back. She is having trouble with her eyesight. She has blurry vision in left eye, but starting yesterday, she has had blurry vision in right eye. Sx are constant. Sx get worse in the light. No other sx. Denies any sx of covid-19 or being exposed to anyone with covid-19. Advised I will discuss with Dr. Epimenio Foot and call her back

## 2018-12-16 NOTE — Telephone Encounter (Signed)
Patient called and stated she is having some problem with eye sight and would like to know if she can have a steroid injection in office or if she needs to go to the ER. She would prefer not go to the hospital if possible, please call and advise.

## 2018-12-16 NOTE — Telephone Encounter (Addendum)
Spoke w/ Freddi Starr, RN w/ [facility]. Pt just had Ocrevus infusion on 12/09/18. Per Dr. Epimenio Foot- ok to bring pt in for IV solumedrol 1G x1 day. Gave signed orders to [facility]. Called pt. She is going to find a ride to the office. She will call back to give a specific time of when she will be here. Advised she will need to come before 4pm. She verbalized understanding.

## 2018-12-17 ENCOUNTER — Other Ambulatory Visit: Payer: Self-pay | Admitting: Neurology

## 2018-12-17 MED ORDER — AMPHETAMINE-DEXTROAMPHETAMINE 10 MG PO TABS: 10.0000 mg | ORAL_TABLET | Freq: Two times a day (BID) | ORAL | 0 refills | Status: DC

## 2018-12-17 NOTE — Telephone Encounter (Signed)
Checked drug registry. Pt last refilled Tylenol#3 11/21/18. Can fill on or after 12/21/18. Dr. Epimenio Foot already sent refill on 12/15/18 stating this.   Pt last refilled adderall 10/23/18 #60. Sent to MD to escribe.  Pt last seen 10/22/18.

## 2018-12-17 NOTE — Telephone Encounter (Signed)
Pt has called for a refill on her acetaminophen-codeine (TYLENOL #3) 300-30 MG tablet    amphetamine-dextroamphetamine (ADDERALL) 10 MG tablet [facility] [phone]

## 2018-12-17 NOTE — Addendum Note (Signed)
Addended by: Hillis Range on: 12/17/2018 02:55 PM   Modules accepted: Orders

## 2018-12-31 ENCOUNTER — Other Ambulatory Visit: Payer: Self-pay | Admitting: *Deleted

## 2018-12-31 DIAGNOSIS — F329 Major depressive disorder, single episode, unspecified: Secondary | ICD-10-CM

## 2018-12-31 DIAGNOSIS — F32A Depression, unspecified: Secondary | ICD-10-CM

## 2018-12-31 DIAGNOSIS — G35 Multiple sclerosis: Secondary | ICD-10-CM

## 2019-01-05 ENCOUNTER — Telehealth: Payer: Self-pay | Admitting: Neurology

## 2019-01-05 NOTE — Telephone Encounter (Signed)
I have called left patient a message asking her to call me I need to talk to her about referral . Thanks Annabelle Harman

## 2019-01-19 ENCOUNTER — Telehealth: Payer: Self-pay | Admitting: *Deleted

## 2019-01-19 NOTE — Telephone Encounter (Signed)
Submitted PA Tylenol #3 on [employer]. Waiting on determination.

## 2019-01-21 NOTE — Telephone Encounter (Signed)
PA denied. Faxed notification to [facility] to let them know PA denied and pt can pay cash or use goodrx coupon that I attached to get rx for 21.70. Fax: [phone]. Received fax confirmation.

## 2019-01-26 ENCOUNTER — Telehealth: Payer: Self-pay | Admitting: Neurology

## 2019-01-26 NOTE — Telephone Encounter (Signed)
I have called and left patient a message about [facility] has been trying call patient to schedule her apt patient has not called back . [facility] also called and left patient a message to call back [phone] .

## 2019-02-11 DIAGNOSIS — Z0271 Encounter for disability determination: Secondary | ICD-10-CM

## 2019-02-25 ENCOUNTER — Telehealth: Payer: Self-pay | Admitting: Neurology

## 2019-02-25 MED ORDER — PREDNISONE 50 MG PO TABS: ORAL_TABLET | ORAL | 0 refills | Status: DC

## 2019-02-25 NOTE — Addendum Note (Signed)
Addended by: Hope Pigeon on: 02/25/2019 04:25 PM   Modules accepted: Orders

## 2019-02-25 NOTE — Telephone Encounter (Signed)
Called pt back. Her right eye is blurry again. Started in the last few days. Sx intermittent and constant depending on the day per pt. Relayed per Dr. Felecia Shelling, can try high dose prednisone taper to see if this helps resolve her sx. Verified pharmacy on file. E-scribed.  Advised her to proceed to ED over the weekend if she has new/worsening sx and to call back Monday if not any better. She verbalized understanding.

## 2019-02-25 NOTE — Telephone Encounter (Signed)
Pt is asking for a call to discuss another steroid injection because her vision is messing up, please call

## 2019-02-26 ENCOUNTER — Emergency Department (HOSPITAL_COMMUNITY)
Admission: EM | Admit: 2019-02-26 | Discharge: 2019-02-26 | Disposition: A | Discharge status: Home / Self Care | Payer: Medicaid Other | Attending: Emergency Medicine | Admitting: Emergency Medicine

## 2019-02-26 ENCOUNTER — Other Ambulatory Visit: Payer: Self-pay

## 2019-02-26 DIAGNOSIS — Y829 Unspecified medical devices associated with adverse incidents: Secondary | ICD-10-CM | POA: Insufficient documentation

## 2019-02-26 DIAGNOSIS — T43205A Adverse effect of unspecified antidepressants, initial encounter: Secondary | ICD-10-CM | POA: Diagnosis not present

## 2019-02-26 DIAGNOSIS — Z87891 Personal history of nicotine dependence: Secondary | ICD-10-CM | POA: Diagnosis not present

## 2019-02-26 DIAGNOSIS — R450 Nervousness: Secondary | ICD-10-CM | POA: Diagnosis not present

## 2019-02-26 DIAGNOSIS — T43215A Adverse effect of selective serotonin and norepinephrine reuptake inhibitors, initial encounter: Secondary | ICD-10-CM | POA: Diagnosis not present

## 2019-02-26 DIAGNOSIS — Z79899 Other long term (current) drug therapy: Secondary | ICD-10-CM | POA: Insufficient documentation

## 2019-02-26 DIAGNOSIS — T887XXA Unspecified adverse effect of drug or medicament, initial encounter: Secondary | ICD-10-CM | POA: Diagnosis not present

## 2019-02-26 DIAGNOSIS — T50905A Adverse effect of unspecified drugs, medicaments and biological substances, initial encounter: Secondary | ICD-10-CM

## 2019-02-26 MED ORDER — SODIUM CHLORIDE 0.9 % IV BOLUS
1000.0000 mL | Freq: Once | INTRAVENOUS | Status: AC
  Administered 2019-02-26: 1000 mL via INTRAVENOUS

## 2019-02-26 NOTE — ED Triage Notes (Signed)
Per pt she started a new medication today sertraline and pt now is very jittery and feels like she can't sleep.Pt says heart is racing. Feels out of her normal.

## 2019-02-26 NOTE — ED Provider Notes (Signed)
[facility] EMERGENCY DEPARTMENT Provider Note   CSN: [id number] Arrival date & time: 02/26/19  0109    History   Chief Complaint Chief Complaint  Patient presents with  . Allergic Reaction    jitters    HPI Alicia Barker is a 33 y.o. female.     33 year old female with a history of depression, multiple sclerosis presents to the emergency department for evaluation of adverse medication reaction.  Patient was started on Zoloft by her primary care doctor yesterday for management of depression/anxiety.  She reports taking this medication at 1500 yesterday.  She began to feel increasingly jittery around 1800.  She states that this feeling has persisted making it difficult for her to sleep.  Also endorsing palpitations characterized as a sensation that her heart is racing.  Denies taking any medications for symptoms prior to arrival.  No difficulty breathing, lip swelling, tongue swelling, difficulty swallowing, wheezing.  The history is provided by the patient. No language interpreter was used.  Allergic Reaction   Past Medical History:  Diagnosis Date  . Depression    no meds currently  . MS (multiple sclerosis) (HCC)   . SVD (spontaneous vaginal delivery)    x 5    Patient Active Problem List   Diagnosis Date Noted  . 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 Attention deficit disorder   . Class 1 obesity due to excess calories without serious comorbidity with body mass index (BMI) of 30.0 to 30.9 in adult   . Stress incontinence 07/25/2017  . Neck pain 03/12/2017  . Acute upper back pain 12/23/2016  . Gait disturbance 06/25/2016  . Dysuria 04/10/2016  . Homeless family 04/10/2016  . Healthcare maintenance 04/10/2016  . Depression 03/28/2016  . Multiple sclerosis exacerbation (HCC) 02/22/2016  . Weakness   . Diplopia   . Left hip pain 01/18/2016  . Left sided sciatica 01/18/2016  . Numbness 11/28/2015  . Ataxia 11/28/2015   . Visual disturbance 11/28/2015  . Other fatigue 11/28/2015  . Urinary frequency 11/28/2015  . High risk medication use 11/28/2015  . Chest pain 11/25/2015  . 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 Dizziness   . Multiple sclerosis (HCC) 11/15/2015    Past Surgical History:  Procedure Laterality Date  . IR FLUORO GUIDE CV LINE RIGHT  07/16/2018  . IR REMOVAL TUN CV CATH W/O FL  08/04/2018  . IR US GUIDE VASC ACCESS RIGHT  07/16/2018  . LAPAROSCOPIC TUBAL LIGATION Bilateral 01/06/2013   Procedure: LAPAROSCOPIC TUBAL LIGATION;  Surgeon: Kathreen Cosier, MD;  Location: [facility];  Service: Gynecology;  Laterality: Bilateral;  . right hand surgery       OB History    Gravida  5   Para  5   Term  4   Preterm  1   AB  0   Living  5     SAB  0   TAB  0   Ectopic  0   Multiple  0   Live Births  5            Home Medications    Prior to Admission medications   Medication Sig Start Date End Date Taking? Authorizing Provider  acetaminophen-codeine (TYLENOL #3) 300-30 MG tablet TAKE 1 TABLET BY MOUTH THREE TIMES DAILY 12/15/18   Sater, Pearletha Furl, MD  amphetamine-dextroamphetamine (ADDERALL) 10 MG tablet Take 1 tablet (10 mg total) by mouth 2 (two) times daily. 12/17/18  Sater, Nanine Means, MD  cyclobenzaprine (FLEXERIL) 5 MG tablet TAKE 1 TABLET BY MOUTH EVERY NIGHT AT BEDTIME. TAKE UP TO THREE TIMES DAILY Patient taking differently: Take 5 mg by mouth as needed for muscle spasms.  03/20/18   Sater, Nanine Means, MD  ibuprofen (ADVIL,MOTRIN) 800 MG tablet TAKE 1 TABLET BY MOUTH AS NEEDED 08/29/18   [place] Bing, DO  meclizine (ANTIVERT) 25 MG tablet Take 1 tablet (25 mg total) by mouth 3 (three) times daily as needed for dizziness. 03/01/16   Sater, Nanine Means, MD  ocrelizumab 600 mg in sodium chloride 0.9 % 500 mL Inject 600 mg into the vein every 6 (six) months.    [provider]  ondansetron (ZOFRAN) 4 MG tablet Take 1 tablet (4 mg total) by mouth as needed for  nausea or vomiting. 03/28/18   Wurst, [place], PA-C  predniSONE (DELTASONE) 50 MG tablet Take 10 tablets on days 1-3, 8 tablets on day four, 6 tablets on day five, 4 tablets on day six, 1 tablet on day seven and 1/2 tablet on the last day 02/25/19   Britt Bottom, MD  sertraline (ZOLOFT) 100 MG tablet Take 1.5 tablets (150 mg total) by mouth daily. 10/22/18   Sater, Nanine Means, MD    Family History Family History  Problem Relation Age of Onset  . Diabetes Mother   . Heart disease Mother   . Diabetes Father   . Multiple sclerosis Cousin     Social History Social History   Tobacco Use  . Smoking status: Former Smoker    Packs/day: 0.10    Types: Cigarettes  . Smokeless tobacco: Never Used  Substance Use Topics  . Alcohol use: No    Alcohol/week: 0.0 standard drinks  . Drug use: No     Allergies   Penicillins   Review of Systems Review of Systems Ten systems reviewed and are negative for acute change, except as noted in the HPI.    Physical Exam Updated Vital Signs BP 124/81   Pulse 64   Temp 97.6 F (36.4 C) (Oral)   Resp 16   SpO2 100%   Physical Exam Vitals signs and nursing note reviewed.  Constitutional:      General: She is not in acute distress.    Appearance: She is well-developed. She is not diaphoretic.     Comments: Nontoxic appearing and in NAD  HENT:     Head: Normocephalic and atraumatic.     Mouth/Throat:     Comments: Tolerating secretions without difficulty Eyes:     General: No scleral icterus.    Conjunctiva/sclera: Conjunctivae normal.  Neck:     Musculoskeletal: Normal range of motion.  Cardiovascular:     Rate and Rhythm: Normal rate and regular rhythm.     Pulses: Normal pulses.  Pulmonary:     Effort: Pulmonary effort is normal. No respiratory distress.     Breath sounds: No stridor. No wheezing or rales.     Comments: Respirations even and unlabored.  Lungs clear to auscultation bilaterally. Musculoskeletal: Normal range of  motion.  Skin:    General: Skin is warm and dry.     Coloration: Skin is not pale.     Findings: No erythema or rash.  Neurological:     General: No focal deficit present.     Mental Status: She is alert and oriented to person, place, and time.     Coordination: Coordination normal.  Psychiatric:  Mood and Affect: Mood is anxious (mild).        Behavior: Behavior normal.      ED Treatments / Results  Labs (all labs ordered are listed, but only abnormal results are displayed) Labs Reviewed - No data to display  EKG None  Radiology No results found.  Procedures Procedures (including critical care time)  Medications Ordered in ED Medications  sodium chloride 0.9 % bolus 1,000 mL (1,000 mLs Intravenous New Bag/Given 02/26/19 0218)    4:00 AM Patient reassessed.  She states that she is feeling better.  Feeling less jittery.  Expresses comfort with discharge and outpatient primary care follow-up.   Initial Impression / Assessment and Plan / ED Course  I have reviewed the triage vital signs and the nursing notes.  Pertinent labs & imaging results that were available during my care of the patient were reviewed by me and considered in my medical decision making (see chart for details).        Patient presenting for adverse reaction to medication.  Was started on Zoloft yesterday.  Has been feeling "jittery" since 4 hours after taking this new medicine.  Denies any other medication changes.  No tongue or lip swelling, difficulty swallowing, shortness of breath.  She has been hydrated with 1 L of IV fluids and monitored in the emergency department without clinical decompensation.  Feels that her symptoms are improving and expresses comfort with discharge.  Advised that she discontinue use of Zoloft until she is able to discuss her side effects with her doctor.  Return precautions discussed and provided. Patient discharged in stable condition with no unaddressed concerns.    Final Clinical Impressions(s) / ED Diagnoses   Final diagnoses:  Medication side effect, initial encounter    ED Discharge Orders    None       Antony MaduraHumes, Tricia [place], PA-C 02/26/19 0424    Shon BatonHorton, Courtney F, MD 02/26/19 [phone]

## 2019-03-04 ENCOUNTER — Encounter: Payer: Self-pay | Admitting: Neurology

## 2019-03-04 ENCOUNTER — Other Ambulatory Visit: Payer: Self-pay

## 2019-03-04 ENCOUNTER — Ambulatory Visit: Payer: Medicaid Other | Admitting: Neurology

## 2019-03-04 ENCOUNTER — Telehealth: Payer: Self-pay | Admitting: Neurology

## 2019-03-04 VITALS — BP 115/73 | HR 78 | Temp 98.0°F | Ht 64.0 in | Wt 223.8 lb

## 2019-03-04 DIAGNOSIS — R42 Dizziness and giddiness: Secondary | ICD-10-CM | POA: Diagnosis not present

## 2019-03-04 DIAGNOSIS — G35 Multiple sclerosis: Secondary | ICD-10-CM | POA: Diagnosis not present

## 2019-03-04 DIAGNOSIS — Z79899 Other long term (current) drug therapy: Secondary | ICD-10-CM

## 2019-03-04 DIAGNOSIS — R270 Ataxia, unspecified: Secondary | ICD-10-CM

## 2019-03-04 DIAGNOSIS — R269 Unspecified abnormalities of gait and mobility: Secondary | ICD-10-CM

## 2019-03-04 DIAGNOSIS — H469 Unspecified optic neuritis: Secondary | ICD-10-CM

## 2019-03-04 MED ORDER — PREDNISONE 50 MG PO TABS: ORAL_TABLET | ORAL | 0 refills | Status: DC

## 2019-03-04 NOTE — Telephone Encounter (Signed)
Called pt. Advised I spoke with Dr. Felecia Shelling and he would like her to come to the office for a follow up to be evaluated. She head our way in about 53min. Placed on schedule. Asked she wear mask and temp will be checked.

## 2019-03-04 NOTE — Telephone Encounter (Signed)
Pt called stating that the steroids that were given to her are not working and she would like to come in for a treatment. Please advise.

## 2019-03-04 NOTE — Progress Notes (Signed)
[facility]  PATIENT: Alicia SimaLovetta T Barker DOB: [birth year]  REFERRING DOCTOR OR PCP:  PCP is Francoise CeoBernard Marshall SOURCE: patient, records from hospital, MRI / lab reports, MRI images on PACS  _________________________________   HISTORICAL  CHIEF COMPLAINT:  Chief Complaint  Patient presents with   Follow-up    RM 13 with daughter. Daughter temp: 98.0.  Here to f/u on MS sx.     HISTORY OF PRESENT ILLNESS:  Alicia Barker is a 33 y.o. woman who was diagnosed with MS in March 2017.     Update 03/04/2019: About 2 weeks ago, she began to note more visual problems out of the right eye.   She is unable to read small print.  Colors are less vibrant.     Her last Ocrevus injection was April and she has tolerated them well.     She noted vertigo initially as well.     She still has some vertigo but it is better.  She does not note any change in strength and there is no new numbness.  Of note, in November 2019 she had an exacerbation with optic neuritis on the left.  Even being treated with plasmapheresis after steroids she did not recover significant vision.  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   She has been on Ocrevus for the past 2 years.  She has tolerated it fairly well but I discussed with her my concern that she might be having a second exacerbation while on it and exacerbations are very rare without medication.  Of note, earlier this year I checked the CD19 and CD20 and she did have 2% B cells.  IgG/IgM has been good.  She denies any in triggering event.  No infections.  Update 10/22/2018: She was [facility]2018 due to left optic neuritis.  She received IV Solu-Medrol but had no improvement and then had plasmapheresis.  Unfortunately the improvement has been mild.  Currently, she can see some shapes and hand waving out of the left eye but cannot count fingers.  There is no color vision on the left.   She also had more difficulty with her gait and some clumsiness in the left arm and leg around the same time of the.  She feels her gait has improved close to the baseline before her heart lesion.  She has some bladder urgency.  I reviewed the MRI of the brain from 07/10/2018.  It showed a couple new lesions compared to 2017 (before she was on Ocrevus) but these were not large enough to cause her symptoms.  There was no new MRI of the spine.  She does note more difficulties with depression.  Earlier this year she has some suicidal ideation but did not have any suicidal plan.  She is on sertraline 100 mg daily.  We discussed seeing counseling.  Additionally, she has more difficulty with fatigue.  She sleeps okay most nights.  She has not noted any change in cognition.  Update 01/19/2018:   She feels her MS is stable.  She denies any exacerbations.  She is on Ocrevus.  She tolerates it well.  Her last infusion was April 2019.  At the last visit we checked IgG/IgM and they were normal.  She notes some difficulty with her gait and some mild weakness in her legs.  The weakness is worse on the right than the left.  When she walks a while she will have  a right foot drop.  She also notes some right leg numbness and tingling.  She is able to walk without a cane.  Vision is fine.  She notes less urinary urgency incontinence on oxybutynin and Myrbetriq  She has some fatigue.  She sleeps well most nights.  She has mild depression, somewhat helped by Celexa.   She never did counseling.   She has had some attention deficit helped by Adderall (does not take every day).      She has fewer headaches.  When one occurs, she takes Tyl #3  Update 07/14/2017: She had her last ocrelizumab infusion at the end of July 2018. The next infusion is scheduled for 09/11/2017. She has tolerated the infusions well and has not had any definite exacerbations. Since her large exacerbation in 2017, she continues to have weakness in her legs.    Her right leg feels weaker than her left leg.   She sometimes notes a right foot drop.    She walks without a cane.    She is able to do some part time work.  She notes some right leg numbness and tingling.   She has some urinary incontinenc associated with urge.     Oxybutynin has helped a lot.     Mood is better recently but some days she feels hopeless.    She is still on an antidepressant and feels it helps.      From 03/12/2017 MS:  She switched from Tecfidera to [place] in December 2017/January 2018 and just had her second infusion.    She has no new exacerbation.  She was started on Tecfidera April 2017.    She was tolerating Tecfidera well but in August had more difficulty with balance and slurred speech.    MRI 03/25/2016 showed several new enhancing lesions c/w active demyelination   She received IV steroids and started Ocrevus, first dose in December.  .     Gait/strength/sensation:   Since the large exacerbation in 2017, She reports reports left > right leg weakness and clumsiness.   Gait is unsteady.   She has fallen a few times.  She uses a cane sometimes but no longer needs a walker. The right leg was weaker before the large exacerbation.   No hand numbness.    The left leg has painful dysesthesias with a stabbing quality.     Gabapentin had not helped in the past    Bladder/bowel: She has more urinary frequency and urgency with occasional urinary incontinence. Oxybutynin has not helped much.   She has rare bowel incontinence.     Vision/vertigo: She notes some visual blurring since last year.   She has some diplopia but this is better.  Fatigue/sleep: She notes a lot of fatigue, physical > mental.   Sleep is worse with more trouble staying asleep.   Her insomnia improved with nighttime cyclobenzaprine earlier this year but now she has more again.  Mood/cognition: She is having some depression and anxiety that worsened last year and has persisted despite escitalopram.     She gets  irritable easily and has a short fuse.     She also reports apathy and poor sleep.  Cognitive processing speed appears to be slower.  HA:   She has pain in the temples and the neck since Monday.   Pain worsens when she stands or moves and is best when she is still.   She denies N/V but has photophobia and phonophobia.   This is  her worst HA in years but similar to ones she had many years ago.   Tylenol #3 and Ibuprofen have not helped.     MS History:   In early March, 2017, she had the onset of slurred speech and leg weakness and ataxia.    A head CT was reportedly normal.    Over the next couple weeks, she had right worse than left visual acuity issues, more numbness and more clumsiness.        Her gait became unsteady.     She also noted mid back pain.    She went to the ER and was diagnosed with an inner ear problem.   She went back to ER 11/15/15 and was found to have an abnormal MRI consistent with MS.    MRI of the spine also showed additional plaques.     She received 5 days of IV Steroids and noticed an improvement with improved gait but continued numbness.    I have reviewed the MRIs of the brain and spine performed for 12/30/2015 and 11/17/2015. The MRI of the brain with and without contrast shows multiple T2/FLAIR hyperintense foci, many in the periventricular white matter.    Another focus is the pons (with mild enhancement, better seen on c-spine MRI).Marland Kitchen 7 foci enhanced after gadolinium administration imply more recent MS plaques.    There are at least 2 small T2 hyperintense foci within the cervical spine, one of which appears to enhance slightly.    Another enhancing focus is noted within the thoracic spine adjacent to T3-T4.   She started Tecfidera after I first saw her 11/28/2015.   She had a significant relapse and was switched over to ocrelizumab if her first dose around January or February 2018.                                                                                       REVIEW OF  SYSTEMS: Constitutional: No fevers, chills, sweats, or change in appetite.  She reports fatigue and poor sleep  Eyes: mild right visual changes, double vision, eye pain Ear, nose and throat: No hearing loss, ear pain, nasal congestion, sore throat Cardiovascular: No chest pain, palpitations Respiratory: No shortness of breath at rest or with exertion.   No wheezes GastrointestinaI: No nausea, vomiting, diarrhea, abdominal pain, fecal incontinence Genitourinary: No dysuria, urinary retention.   She has some urgency and frequency..  She has some nocturia. Musculoskeletal: Reports neck pain, back pain Integumentary: No rash, pruritus, skin lesions Neurological: as above Psychiatric: Notes depression > anxiety.    Endocrine: No palpitations, diaphoresis, change in appetite, change in weigh or increased thirst Hematologic/Lymphatic: No anemia, purpura, petechiae. Allergic/Immunologic: No itchy/runny eyes, nasal congestion, recent allergic reactions, rashes  ALLERGIES: Allergies  Allergen Reactions   Penicillins Anaphylaxis, Shortness Of Breath and Swelling    Has patient had a PCN reaction causing immediate rash, facial/tongue/throat swelling, SOB or lightheadedness with hypotension: YES Has patient had a PCN reaction causing severe rash involving mucus membranes or skin necrosis: NO Has patient had a PCN reaction that required hospitalization NO Has patient had a PCN reaction occurring within the last 10  years: NO If all of the above answers are "NO", then may proceed with Cephalosporin use.   Zoloft [Sertraline Hcl]     Jittery per pt    HOME MEDICATIONS:  Current Outpatient Medications:    acetaminophen-codeine (TYLENOL #3) 300-30 MG tablet, TAKE 1 TABLET BY MOUTH THREE TIMES DAILY, Disp: 90 tablet, Rfl: 2   amphetamine-dextroamphetamine (ADDERALL) 10 MG tablet, Take 1 tablet (10 mg total) by mouth 2 (two) times daily., Disp: 60 tablet, Rfl: 0   cyclobenzaprine (FLEXERIL) 5  MG tablet, TAKE 1 TABLET BY MOUTH EVERY NIGHT AT BEDTIME. TAKE UP TO THREE TIMES DAILY (Patient taking differently: Take 5 mg by mouth as needed for muscle spasms. ), Disp: 90 tablet, Rfl: 11   ibuprofen (ADVIL,MOTRIN) 800 MG tablet, TAKE 1 TABLET BY MOUTH AS NEEDED, Disp: 30 tablet, Rfl: 0   meclizine (ANTIVERT) 25 MG tablet, Take 1 tablet (25 mg total) by mouth 3 (three) times daily as needed for dizziness., Disp: 30 tablet, Rfl: 0   ocrelizumab 600 mg in sodium chloride 0.9 % 500 mL, Inject 600 mg into the vein every 6 (six) months., Disp: , Rfl:    ondansetron (ZOFRAN) 4 MG tablet, Take 1 tablet (4 mg total) by mouth as needed for nausea or vomiting., Disp: 3 tablet, Rfl: 0   predniSONE (DELTASONE) 50 MG tablet, 10 po x 1 day (500 g) then 8 po x 1d then 6 po x 1d, then 4 po x 1d, then 2 po x1d, then 1 po x 1d, then 1/2 po x 1d, Disp: 52 tablet, Rfl: 0 No current facility-administered medications for this visit.   Facility-Administered Medications Ordered in Other Visits:    vancomycin (VANCOCIN) 1,000 mg in sodium chloride 0.9 % 500 mL IVPB, 1,000 mg, Intravenous, Once, [place] HerbWatterson, Shannon A, PA-C  PAST MEDICAL HISTORY: Past Medical History:  Diagnosis Date   Depression    no meds currently   MS (multiple sclerosis) (HCC)    SVD (spontaneous vaginal delivery)    x 5    PAST SURGICAL HISTORY: Past Surgical History:  Procedure Laterality Date   IR FLUORO GUIDE CV LINE RIGHT  07/16/2018   IR REMOVAL TUN CV CATH W/O FL  08/04/2018   IR US GUIDE VASC ACCESS RIGHT  07/16/2018   LAPAROSCOPIC TUBAL LIGATION Bilateral 01/06/2013   Procedure: LAPAROSCOPIC TUBAL LIGATION;  Surgeon: Kathreen CosierBernard A Marshall, MD;  Location: [facility];  Service: Gynecology;  Laterality: Bilateral;   right hand surgery      FAMILY HISTORY: Family History  Problem Relation Age of Onset   Diabetes Mother    Heart disease Mother    Diabetes Father    Multiple sclerosis Cousin     SOCIAL  HISTORY:  Social History   Socioeconomic History   Marital status: Married    Spouse name: Not on file   Number of children: Not on file   Years of education: Not on file   Highest education level: Not on file  Occupational History   Not on file  Social Needs   Financial resource strain: Not on file   Food insecurity    Worry: Not on file    Inability: Not on file   Transportation needs    Medical: Not on file    Non-medical: Not on file  Tobacco Use   Smoking status: Former Smoker    Packs/day: 0.10    Types: Cigarettes   Smokeless tobacco: Never Used  Substance and Sexual Activity   Alcohol  use: No    Alcohol/week: 0.0 standard drinks   Drug use: No   Sexual activity: Yes    Birth control/protection: None  Lifestyle   Physical activity    Days per week: Not on file    Minutes per session: Not on file   Stress: Not on file  Relationships   Social connections    Talks on phone: Not on file    Gets together: Not on file    Attends religious service: Not on file    Active member of club or organization: Not on file    Attends meetings of clubs or organizations: Not on file    Relationship status: Not on file   Intimate partner violence    Fear of current or ex partner: Not on file    Emotionally abused: Not on file    Physically abused: Not on file    Forced sexual activity: Not on file  Other Topics Concern   Not on file  Social History Narrative   Not on file     PHYSICAL EXAM  Vitals:   03/04/19 1432  BP: 115/73  Pulse: 78  Temp: 98 F (36.7 C)  Weight: 223 lb 12.8 oz (101.5 kg)  Height:  (1.626 m)    Body mass index is 38.42 kg/m.   General: The patient is well-developed and well-nourished and in no acute distress.   She has no rashes.   She has left optic disc pallor    Neurologic Exam  Mental status: The patient is alert and oriented x 3 at the time of the examination. The patient has apparent normal recent and  remote memory, with an apparently normal attention span and concentration ability.   Speech is normal.  Cranial nerves: Extraocular movements are full.  2+ left APD.  Visual acuity is hand waving only on the left.  She cannot count fingers.  On the right visual acuity is 20/40.  Facial strength and sensation is normal. Trapezius and sternocleidomastoid strength is normal. No dysarthria is noted.  The tongue is midline, and the patient has symmetric elevation of the soft palate. No obvious hearing deficits are noted.  Motor:  Muscle bulk is normal.   Muscle tone is normal.  Strength was 5/5 in the arms and the right leg in the proximal left leg but 4+/5 in the left ankle and toe extensors  Sensory:   Vibration sensation and touch sensation is normal and symmetric in the legs. Coordination: Finger-nose-finger is normal. Heel-to-shin is reduced in the legs, worse on the left.   Gait and station: Station is normal.  She has a left foot drop.  Gait is off balance though she can walk without support.  She cannot do a tandem walk.  Romberg is negative.   Reflexes: Deep tendon reflexes are symmetric and normal bilaterally.           DIAGNOSTIC DATA (LABS, IMAGING, TESTING) - I reviewed patient records, labs, notes, testing and imaging myself where available.  Lab Results  Component Value Date   WBC 5.6 10/22/2018   HGB 11.0 (L) 10/22/2018   HCT 35.1 10/22/2018   MCV 89 10/22/2018   PLT 276 10/22/2018      Component Value Date/Time   NA 137 07/22/2018 0852   K 3.9 07/22/2018 0852   CL 104 07/22/2018 0852   CO2 25 07/22/2018 0852   GLUCOSE 95 07/22/2018 0852   BUN 10 07/22/2018 0852   CREATININE 0.64 07/22/2018 2130  CALCIUM 9.0 07/22/2018 0852   PROT 5.6 (L) 07/22/2018 0852   PROT 6.5 04/16/2016 1119   ALBUMIN 4.1 07/22/2018 0852   ALBUMIN 3.9 04/16/2016 1119   AST 21 07/22/2018 0852   ALT 25 07/22/2018 0852   ALKPHOS 32 (L) 07/22/2018 0852   BILITOT 0.7 07/22/2018 0852   BILITOT  <0.2 04/16/2016 1119   GFRNONAA >60 07/22/2018 0852   GFRAA >60 07/22/2018 0852       ASSESSMENT AND PLAN    1. Optic neuritis due to multiple sclerosis (HCC)   2. Multiple sclerosis (HCC)   3. Ataxia   4. Dizziness   5. Gait disturbance   6. High risk medication use      1.    She appears to be having an exacerbation with right optic neuritis.  Of note, she had a severe exacerbation last November with resulting severe visual loss on the left that has persisted.  I will have her get 1 g of IV Solu-Medrol and then do a high-dose taper with prednisone starting at 500 mg for several days.  We will check an MRI of the brain and compare with her previous MRI.  We need to consider a switch in therapy.  Generally patients have done very well with Ocrevus.  Exacerbations and MRI changes are not common over the short-term.  We would need to consider a change to [facility] or 1 of the oral agents.  She is JCV high titer positive so Tysabri would not be a good option for her long-term, though I would consider a short-term.   We will also check lab work. 2.    Continue other medications. 3.   Return in 3 months or sooner if there are new or worsening neurologic symptoms. We discussed symptoms of  possible exacerbations.   Elyssa Pendelton A. Epimenio FootSater, MD, PhD 03/04/2019, 6:10 PM Certified in Neurology, Clinical Neurophysiology, Sleep Medicine, Pain Medicine and Neuroimaging  [facility] [address], [address] [place], [place] [address] [phone]

## 2019-03-08 ENCOUNTER — Telehealth: Payer: Self-pay | Admitting: Neurology

## 2019-03-08 LAB — CBC WITH DIFFERENTIAL/PLATELET
Basophils Absolute: 0 10*3/uL (ref 0.0–0.2)
Basos: 1 %
EOS (ABSOLUTE): 0.1 10*3/uL (ref 0.0–0.4)
Eos: 2 %
Hematocrit: 35.8 % (ref 34.0–46.6)
Hemoglobin: 11.6 g/dL (ref 11.1–15.9)
Immature Grans (Abs): 0 10*3/uL (ref 0.0–0.1)
Immature Granulocytes: 1 %
Lymphocytes Absolute: 2.1 10*3/uL (ref 0.7–3.1)
Lymphs: 36 %
MCH: 27.9 pg (ref 26.6–33.0)
MCHC: 32.4 g/dL (ref 31.5–35.7)
MCV: 86 fL (ref 79–97)
Monocytes Absolute: 0.5 10*3/uL (ref 0.1–0.9)
Monocytes: 9 %
Neutrophils Absolute: 3.1 10*3/uL (ref 1.4–7.0)
Neutrophils: 51 %
Platelets: 343 10*3/uL (ref 150–450)
RBC: 4.16 x10E6/uL (ref 3.77–5.28)
RDW: 15.2 % (ref 11.7–15.4)
WBC: 6 10*3/uL (ref 3.4–10.8)

## 2019-03-08 LAB — NEUROMYELITIS OPTICA AUTOAB, IGG: NMO IgG Autoantibodies: <1.5 U/mL (ref 0.0–3.0)

## 2019-03-08 LAB — CD19 AND CD20, FLOW CYTOMETRY
% CD19: NEGATIVE %
% CD20: NEGATIVE %

## 2019-03-08 NOTE — Telephone Encounter (Signed)
Medicaid order sent to [facility]. They will obtain the auth and reach out to the patient to schedule.  

## 2019-03-16 ENCOUNTER — Telehealth: Payer: Self-pay | Admitting: *Deleted

## 2019-03-16 NOTE — Telephone Encounter (Signed)
Received fax notification from [employer] that prescriber foundation/patient consent form expires on 04/11/19 for Ocrevus. In order for pt to continue receiving assistance, they need updated forms. I called and spoke with Husband. They will come by this week to sign form.

## 2019-03-22 ENCOUNTER — Other Ambulatory Visit: Payer: Self-pay | Admitting: Neurology

## 2019-03-23 NOTE — Telephone Encounter (Signed)
Pt came and signed form. I faxed completed/signed forms to [employer] at [phone] and [facility] at [phone]. Received fax confirmation for both. Gave copy to [facility] and sent copy to be scanned to [facility].

## 2019-03-23 NOTE — Telephone Encounter (Signed)
Checked drug registry. She last refilled 02/12/19 #90. Last seen 03/04/19. No f/u pending. Checked with billing and she has a balance she needs to make a payment towards prior to making a f/u.

## 2019-03-23 NOTE — Telephone Encounter (Signed)
Called and spoke with husband again. They did not come by to sign forms. He will come today with her at his lunch break to sign forms. I placed up front and asked they be returned to me once signed.

## 2019-04-14 NOTE — Telephone Encounter (Signed)
Received fax notification from [employer] that patient is approved to receive Ocrevus free of charge. Sent copy to be scanned into chart and copy to [facility]. Pharmacy: [facility] Phone: [phone]

## 2019-04-16 ENCOUNTER — Other Ambulatory Visit: Payer: Self-pay

## 2019-04-16 DIAGNOSIS — Z20822 Contact with and (suspected) exposure to covid-19: Secondary | ICD-10-CM

## 2019-04-16 DIAGNOSIS — R6889 Other general symptoms and signs: Secondary | ICD-10-CM | POA: Diagnosis not present

## 2019-04-17 LAB — NOVEL CORONAVIRUS, NAA: SARS-CoV-2, NAA: NOT DETECTED

## 2019-04-30 ENCOUNTER — Other Ambulatory Visit: Payer: Self-pay

## 2019-04-30 DIAGNOSIS — Z20822 Contact with and (suspected) exposure to covid-19: Secondary | ICD-10-CM

## 2019-04-30 DIAGNOSIS — R6889 Other general symptoms and signs: Secondary | ICD-10-CM | POA: Diagnosis not present

## 2019-05-01 LAB — NOVEL CORONAVIRUS, NAA: SARS-CoV-2, NAA: NOT DETECTED

## 2019-05-03 ENCOUNTER — Telehealth: Payer: Medicaid Other

## 2019-05-03 ENCOUNTER — Telehealth: Payer: Self-pay

## 2019-05-03 NOTE — Telephone Encounter (Signed)
Pt called and requested an appointment for today. However there were No openings. Scheduled hr for 05/04/19 w/ Amy Lomax    By Cher Nakai, [place]

## 2019-05-04 ENCOUNTER — Ambulatory Visit (INDEPENDENT_AMBULATORY_CARE_PROVIDER_SITE_OTHER): Payer: Medicaid Other | Admitting: Family Medicine

## 2019-05-04 ENCOUNTER — Emergency Department (HOSPITAL_COMMUNITY): Payer: Medicaid Other

## 2019-05-04 ENCOUNTER — Encounter: Payer: Self-pay | Admitting: Family Medicine

## 2019-05-04 ENCOUNTER — Encounter (HOSPITAL_COMMUNITY): Payer: Self-pay

## 2019-05-04 ENCOUNTER — Emergency Department (HOSPITAL_COMMUNITY)
Admission: EM | Admit: 2019-05-04 | Discharge: 2019-05-05 | Disposition: A | Discharge status: Home / Self Care | Payer: Medicaid Other | Attending: Emergency Medicine | Admitting: Emergency Medicine

## 2019-05-04 ENCOUNTER — Other Ambulatory Visit: Payer: Self-pay

## 2019-05-04 ENCOUNTER — Ambulatory Visit: Payer: Self-pay | Admitting: Family Medicine

## 2019-05-04 VITALS — BP 106/62 | HR 62

## 2019-05-04 DIAGNOSIS — R350 Frequency of micturition: Secondary | ICD-10-CM | POA: Diagnosis not present

## 2019-05-04 DIAGNOSIS — Z79899 Other long term (current) drug therapy: Secondary | ICD-10-CM | POA: Diagnosis not present

## 2019-05-04 DIAGNOSIS — H538 Other visual disturbances: Secondary | ICD-10-CM | POA: Diagnosis not present

## 2019-05-04 DIAGNOSIS — R079 Chest pain, unspecified: Secondary | ICD-10-CM | POA: Diagnosis present

## 2019-05-04 DIAGNOSIS — Z20828 Contact with and (suspected) exposure to other viral communicable diseases: Secondary | ICD-10-CM | POA: Diagnosis not present

## 2019-05-04 DIAGNOSIS — Z87891 Personal history of nicotine dependence: Secondary | ICD-10-CM | POA: Diagnosis not present

## 2019-05-04 DIAGNOSIS — R0789 Other chest pain: Secondary | ICD-10-CM | POA: Diagnosis not present

## 2019-05-04 DIAGNOSIS — G35 Multiple sclerosis: Secondary | ICD-10-CM | POA: Diagnosis not present

## 2019-05-04 DIAGNOSIS — R0602 Shortness of breath: Secondary | ICD-10-CM | POA: Diagnosis not present

## 2019-05-04 LAB — COMPREHENSIVE METABOLIC PANEL
ALT: 22 U/L (ref 0–44)
AST: 19 U/L (ref 15–41)
Albumin: 3.8 g/dL (ref 3.5–5.0)
Alkaline Phosphatase: 57 U/L (ref 38–126)
Anion gap: 11 (ref 5–15)
BUN: 9 mg/dL (ref 6–20)
CO2: 23 mmol/L (ref 22–32)
Calcium: 9.1 mg/dL (ref 8.9–10.3)
Chloride: 104 mmol/L (ref 98–111)
Creatinine, Ser: 0.82 mg/dL (ref 0.44–1.00)
GFR calc Af Amer: >60 mL/min (ref >60)
GFR calc non Af Amer: >60 mL/min (ref >60)
Glucose, Bld: 101 mg/dL — ABNORMAL HIGH (ref 70–99)
Potassium: 3.4 mmol/L — ABNORMAL LOW (ref 3.5–5.1)
Sodium: 138 mmol/L (ref 135–145)
Total Bilirubin: 0.3 mg/dL (ref 0.3–1.2)
Total Protein: 6.3 g/dL — ABNORMAL LOW (ref 6.5–8.1)

## 2019-05-04 LAB — POCT URINALYSIS DIP (MANUAL ENTRY)
Bilirubin, UA: NEGATIVE
Blood, UA: NEGATIVE
Glucose, UA: NEGATIVE mg/dL
Leukocytes, UA: NEGATIVE
Nitrite, UA: NEGATIVE
Protein Ur, POC: NEGATIVE mg/dL
Spec Grav, UA: 1.025 (ref 1.010–1.025)
Urobilinogen, UA: 1 E.U./dL
pH, UA: 6.5 (ref 5.0–8.0)

## 2019-05-04 LAB — I-STAT BETA HCG BLOOD, ED (MC, WL, AP ONLY): I-stat hCG, quantitative: <5 m[IU]/mL (ref <5)

## 2019-05-04 LAB — CBC
HCT: 33.5 % — ABNORMAL LOW (ref 36.0–46.0)
Hemoglobin: 11.1 g/dL — ABNORMAL LOW (ref 12.0–15.0)
MCH: 29.1 pg (ref 26.0–34.0)
MCHC: 33.1 g/dL (ref 30.0–36.0)
MCV: 87.7 fL (ref 80.0–100.0)
Platelets: 370 10*3/uL (ref 150–400)
RBC: 3.82 MIL/uL — ABNORMAL LOW (ref 3.87–5.11)
RDW: 14.6 % (ref 11.5–15.5)
WBC: 5.3 10*3/uL (ref 4.0–10.5)
nRBC: 0 % (ref 0.0–0.2)

## 2019-05-04 LAB — DIFFERENTIAL
Abs Immature Granulocytes: 0.05 10*3/uL (ref 0.00–0.07)
Basophils Absolute: 0 10*3/uL (ref 0.0–0.1)
Basophils Relative: 1 %
Eosinophils Absolute: 0.2 10*3/uL (ref 0.0–0.5)
Eosinophils Relative: 4 %
Immature Granulocytes: 1 %
Lymphocytes Relative: 30 %
Lymphs Abs: 1.6 10*3/uL (ref 0.7–4.0)
Monocytes Absolute: 0.5 10*3/uL (ref 0.1–1.0)
Monocytes Relative: 9 %
Neutro Abs: 2.9 10*3/uL (ref 1.7–7.7)
Neutrophils Relative %: 55 %

## 2019-05-04 NOTE — ED Triage Notes (Signed)
Pt endorses having MS, has had chronic visual problems with left eye and now having visual problems with the right eye x 1 week. Describes having blurred vision. VSS. Axox4.

## 2019-05-04 NOTE — ED Notes (Signed)
Patient transported to X-ray 

## 2019-05-04 NOTE — Progress Notes (Deleted)
PATIENT: Alicia Barker DOB: [birth year]  REASON FOR VISIT: follow up HISTORY FROM: patient  No chief complaint on file.    HISTORY OF PRESENT ILLNESS: Today 05/04/19 Alicia Barker is a 33 y.o. female here today for follow up.   HISTORY: (copied from dr Garth Bigness note on 03/04/2019)  About 2 weeks ago, she began to note more visual problems out of the right eye.   She is unable to read small print.  Colors are less vibrant.     Her last Ocrevus injection was April and she has tolerated them well.     She noted vertigo initially as well.     She still has some vertigo but it is better.  She does not note any change in strength and there is no new numbness.  Of note, in November 2019 she had an exacerbation with optic neuritis on the left.  Even being treated with plasmapheresis after steroids she did not recover significant vision.  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   She has been on Ocrevus for the past 2 years.  She has tolerated it fairly well but I discussed with her my concern that she might be having a second exacerbation while on it and exacerbations are very rare without medication.  Of note, earlier this year I checked the CD19 and CD20 and she did have 2% B cells.  IgG/IgM has been good.  She denies any in triggering event.  No infections.  Update 10/22/2018: She was [facility] to [facility]/10/2018 due to left optic neuritis.  She received IV Solu-Medrol but had no improvement and then had plasmapheresis.  Unfortunately the improvement has been mild.  Currently, she can see some shapes and hand waving out of the left eye but cannot count fingers.  There is no color vision on the left.  She also had more difficulty with her gait and some clumsiness in the left arm and leg around the same time of the.  She feels her gait has improved close to the baseline before her heart lesion.  She has some bladder urgency.  I reviewed  the MRI of the brain from 07/10/2018.  It showed a couple new lesions compared to 2017 (before she was on Ocrevus) but these were not large enough to cause her symptoms.  There was no new MRI of the spine.  She does note more difficulties with depression.  Earlier this year she has some suicidal ideation but did not have any suicidal plan.  She is on sertraline 100 mg daily.  We discussed seeing counseling.  Additionally, she has more difficulty with fatigue.  She sleeps okay most nights.  She has not noted any change in cognition.  Update 01/19/2018:   She feels her MS is stable.  She denies any exacerbations.  She is on Ocrevus.  She tolerates it well.  Her last infusion was April 2019.  At the last visit we checked IgG/IgM and they were normal.  She notes some difficulty with her gait and some mild weakness in her legs.  The weakness is worse on the right than the left.  When she walks a while she will have a right foot drop.  She also notes some right leg numbness and tingling.  She is able to walk without a cane.  Vision is fine.  She notes less urinary urgency incontinence on oxybutynin and Myrbetriq  She has some fatigue.  She sleeps well most nights.  She has mild depression, somewhat helped by Celexa.   She never did counseling.   She has had some attention deficit helped by Adderall (does not take every day).      She has fewer headaches.  When one occurs, she takes Tyl #3  Update 07/14/2017: She had her last ocrelizumab infusion at the end of July 2018. The next infusion is scheduled for 09/11/2017. She has tolerated the infusions well and has not had any definite exacerbations. Since her large exacerbation in 2017, she continues to have weakness in her legs.   Her right leg feels weaker than her left leg.   She sometimes notes a right foot drop.    She walks without a cane.    She is able to do some part time work.  She notes some right leg numbness and tingling.   She has some urinary  incontinenc associated with urge.     Oxybutynin has helped a lot.     Mood is better recently but some days she feels hopeless.    She is still on an antidepressant and feels it helps.    From 03/12/2017 MS:  She switched from Tecfidera to Ocrevus in December 2017/January 2018 and just had her second infusion.    She has no new exacerbation.  She was started on Tecfidera April 2017.    She was tolerating Tecfidera well but in August had more difficulty with balance and slurred speech.    MRI 03/25/2016 showed several new enhancing lesions c/w active demyelination   She received IV steroids and started Ocrevus, first dose in December.  .     Gait/strength/sensation:   Since the large exacerbation in 2017, She reports reports left > right leg weakness and clumsiness.   Gait is unsteady.   She has fallen a few times.  She uses a cane sometimes but no longer needs a walker. The right leg was weaker before the large exacerbation.   No hand numbness.    The left leg has painful dysesthesias with a stabbing quality.     Gabapentin had not helped in the past    Bladder/bowel: She has more urinary frequency and urgency with occasional urinary incontinence. Oxybutynin has not helped much.   She has rare bowel incontinence.     Vision/vertigo: She notes some visual blurring since last year.   She has some diplopia but this is better.  Fatigue/sleep: She notes a lot of fatigue, physical > mental.   Sleep is worse with more trouble staying asleep.   Her insomnia improved with nighttime cyclobenzaprine earlier this year but now she has more again.  Mood/cognition: She is having some depression and anxiety that worsened last year and has persisted despite escitalopram.     She gets irritable easily and has a short fuse.     She also reports apathy and poor sleep.  Cognitive processing speed appears to be slower.  HA:   She has pain in the temples and the neck since Monday.   Pain worsens when she stands or  moves and is best when she is still.   She denies N/V but has photophobia and phonophobia.   This is her worst HA in years but similar to ones she had many years ago.   Tylenol #3 and Ibuprofen have not helped.     MS History:   In early March, 2017, she had the onset of slurred speech and leg weakness and  ataxia.    A head CT was reportedly normal.    Over the next couple weeks, she had right worse than left visual acuity issues, more numbness and more clumsiness.        Her gait became unsteady.     She also noted mid back pain.    She went to the ER and was diagnosed with an inner ear problem.   She went back to ER 11/15/15 and was found to have an abnormal MRI consistent with MS.    MRI of the spine also showed additional plaques.     She received 5 days of IV Steroids and noticed an improvement with improved gait but continued numbness.    I have reviewed the MRIs of the brain and spine performed for 12/30/2015 and 11/17/2015. The MRI of the brain with and without contrast shows multiple T2/FLAIR hyperintense foci, many in the periventricular white matter.    Another focus is the pons (with mild enhancement, better seen on c-spine MRI).Marland Kitchen 7 foci enhanced after gadolinium administration imply more recent MS plaques.    There are at least 2 small T2 hyperintense foci within the cervical spine, one of which appears to enhance slightly.    Another enhancing focus is noted within the thoracic spine adjacent to T3-T4.   She started Tecfidera after I first saw her 11/28/2015.   She had a significant relapse and was switched over to ocrelizumab if her first dose around January or February 2018.       REVIEW OF SYSTEMS: Out of a complete 14 system review of symptoms, the patient complains only of the following symptoms, and all other reviewed systems are negative.  ALLERGIES: Allergies  Allergen Reactions   Penicillins Anaphylaxis, Shortness Of Breath and Swelling    Has patient had a PCN reaction causing  immediate rash, facial/tongue/throat swelling, SOB or lightheadedness with hypotension: YES Has patient had a PCN reaction causing severe rash involving mucus membranes or skin necrosis: NO Has patient had a PCN reaction that required hospitalization NO Has patient had a PCN reaction occurring within the last 10 years: NO If all of the above answers are "NO", then may proceed with Cephalosporin use.   Zoloft [Sertraline Hcl]     Jittery per pt    HOME MEDICATIONS: Outpatient Medications Prior to Visit  Medication Sig Dispense Refill   acetaminophen-codeine (TYLENOL #3) 300-30 MG tablet TAKE 1 TABLET BY MOUTH THREE TIMES DAILY 90 tablet 2   amphetamine-dextroamphetamine (ADDERALL) 10 MG tablet Take 1 tablet (10 mg total) by mouth 2 (two) times daily. 60 tablet 0   cyclobenzaprine (FLEXERIL) 5 MG tablet TAKE 1 TABLET BY MOUTH EVERY NIGHT AT BEDTIME. TAKE UP TO THREE TIMES DAILY (Patient taking differently: Take 5 mg by mouth as needed for muscle spasms. ) 90 tablet 11   ibuprofen (ADVIL,MOTRIN) 800 MG tablet TAKE 1 TABLET BY MOUTH AS NEEDED 30 tablet 0   meclizine (ANTIVERT) 25 MG tablet Take 1 tablet (25 mg total) by mouth 3 (three) times daily as needed for dizziness. 30 tablet 0   ocrelizumab 600 mg in sodium chloride 0.9 % 500 mL Inject 600 mg into the vein every 6 (six) months.     ondansetron (ZOFRAN) 4 MG tablet Take 1 tablet (4 mg total) by mouth as needed for nausea or vomiting. 3 tablet 0   predniSONE (DELTASONE) 50 MG tablet 10 po x 1 day (500 g) then 8 po x 1d then 6 po x 1d,  then 4 po x 1d, then 2 po x1d, then 1 po x 1d, then 1/2 po x 1d 52 tablet 0   Facility-Administered Medications Prior to Visit  Medication Dose Route Frequency Provider Last Rate Last Dose   vancomycin (VANCOCIN) 1,000 mg in sodium chloride 0.9 % 500 mL IVPB  1,000 mg Intravenous Once [place] HerbWatterson, Shannon A, PA-C        PAST MEDICAL HISTORY: Past Medical History:  Diagnosis Date   Depression     no meds currently   MS (multiple sclerosis) (HCC)    SVD (spontaneous vaginal delivery)    x 5    PAST SURGICAL HISTORY: Past Surgical History:  Procedure Laterality Date   IR FLUORO GUIDE CV LINE RIGHT  07/16/2018   IR REMOVAL TUN CV CATH W/O FL  08/04/2018   IR US GUIDE VASC ACCESS RIGHT  07/16/2018   LAPAROSCOPIC TUBAL LIGATION Bilateral 01/06/2013   Procedure: LAPAROSCOPIC TUBAL LIGATION;  Surgeon: Kathreen CosierBernard A Marshall, MD;  Location: [facility];  Service: Gynecology;  Laterality: Bilateral;   right hand surgery      FAMILY HISTORY: Family History  Problem Relation Age of Onset   Diabetes Mother    Heart disease Mother    Diabetes Father    Multiple sclerosis Cousin     SOCIAL HISTORY: Social History   Socioeconomic History   Marital status: Married    Spouse name: Not on file   Number of children: Not on file   Years of education: Not on file   Highest education level: Not on file  Occupational History   Not on file  Social Needs   Financial resource strain: Not on file   Food insecurity    Worry: Not on file    Inability: Not on file   Transportation needs    Medical: Not on file    Non-medical: Not on file  Tobacco Use   Smoking status: Former Smoker    Packs/day: 0.10    Types: Cigarettes   Smokeless tobacco: Never Used  Substance and Sexual Activity   Alcohol use: No    Alcohol/week: 0.0 standard drinks   Drug use: No   Sexual activity: Yes    Birth control/protection: None  Lifestyle   Physical activity    Days per week: Not on file    Minutes per session: Not on file   Stress: Not on file  Relationships   Social connections    Talks on phone: Not on file    Gets together: Not on file    Attends religious service: Not on file    Active member of club or organization: Not on file    Attends meetings of clubs or organizations: Not on file    Relationship status: Not on file   Intimate partner violence    Fear of current  or ex partner: Not on file    Emotionally abused: Not on file    Physically abused: Not on file    Forced sexual activity: Not on file  Other Topics Concern   Not on file  Social History Narrative   Not on file      PHYSICAL EXAM  There were no vitals filed for this visit. There is no height or weight on file to calculate BMI.  Generalized: Well developed, in no acute distress  Cardiology: normal rate and rhythm, no murmur noted Neurological examination  Mentation: Alert oriented to time, place, history taking. Follows all commands speech and language fluent Cranial  nerve II-XII: Pupils were equal round reactive to light. Extraocular movements were full, visual field were full on confrontational test. Facial sensation and strength were normal. Uvula tongue midline. Head turning and shoulder shrug  were normal and symmetric. Motor: The motor testing reveals 5 over 5 strength of all 4 extremities. Good symmetric motor tone is noted throughout.  Sensory: Sensory testing is intact to soft touch on all 4 extremities. No evidence of extinction is noted.  Coordination: Cerebellar testing reveals good finger-nose-finger and heel-to-shin bilaterally.  Gait and station: Gait is normal. Tandem gait is normal. Romberg is negative. No drift is seen.  Reflexes: Deep tendon reflexes are symmetric and normal bilaterally.   DIAGNOSTIC DATA (LABS, IMAGING, TESTING) - I reviewed patient records, labs, notes, testing and imaging myself where available.  No flowsheet data found.   Lab Results  Component Value Date   WBC 6.0 03/04/2019   HGB 11.6 03/04/2019   HCT 35.8 03/04/2019   MCV 86 03/04/2019   PLT 343 03/04/2019      Component Value Date/Time   NA 137 07/22/2018 0852   K 3.9 07/22/2018 0852   CL 104 07/22/2018 0852   CO2 25 07/22/2018 0852   GLUCOSE 95 07/22/2018 0852   BUN 10 07/22/2018 0852   CREATININE 0.64 07/22/2018 0852   CALCIUM 9.0 07/22/2018 0852   PROT 5.6 (L)  07/22/2018 0852   PROT 6.5 04/16/2016 1119   ALBUMIN 4.1 07/22/2018 0852   ALBUMIN 3.9 04/16/2016 1119   AST 21 07/22/2018 0852   ALT 25 07/22/2018 0852   ALKPHOS 32 (L) 07/22/2018 0852   BILITOT 0.7 07/22/2018 0852   BILITOT <0.2 04/16/2016 1119   GFRNONAA >60 07/22/2018 0852   GFRAA >60 07/22/2018 0852   No results found for: CHOL, HDL, LDLCALC, LDLDIRECT, TRIG, CHOLHDL Lab Results  Component Value Date   HGBA1C 5.3 02/22/2016   No results found for: VITAMINB12 Lab Results  Component Value Date   TSH 1.287 02/22/2016       ASSESSMENT AND PLAN 33 y.o. year old female  has a past medical history of Depression, MS (multiple sclerosis) (HCC), and SVD (spontaneous vaginal delivery). here with ***    ICD-10-CM   1. MS (multiple sclerosis) (HCC)  G35   2. Optic neuritis due to multiple sclerosis (HCC)  H46.9    G35   3. High risk medication use  Z79.899        No orders of the defined types were placed in this encounter.    No orders of the defined types were placed in this encounter.     I spent 15 minutes with the patient. 50% of this time was spent counseling and educating patient on plan of care and medications.    Shawnie Dapper, FNP-C 05/04/2019, 11:09 AM [facility] [address], [address] [place], [place] [address] [phone]

## 2019-05-04 NOTE — Patient Instructions (Signed)
It was great to meet you today! Thank you for letting me participate in your care!  Today, we discussed a possible urinary tract and a possible MS flare. I will contact your Neurologist and make them aware of what is going on. If they want to treat you with IV steroids they may recommend you go to the hospital.   If your urine is abnormal I will call you and we will discuss treatment.  Be well, Harolyn Rutherford, DO PGY-3, [facility]

## 2019-05-04 NOTE — Progress Notes (Signed)
     Subjective: Chief Complaint  Patient presents with  . Neck Pain  . Headache     HPI: Alicia Barker is a 33 y.o. presenting to clinic today to discuss the following:  Head and Neck Pain Patient describes a "pins and needles" feeling for about one week on the left side of her head. She tried to get into to see her Neurologist today but missed her appointment.  She states she has some pain as well but it does not feel like a regular headache. She has had similar symptoms like this in the past with an acute flare of her MS. She denies any visual changes, no loss of bladder control more than usual, no change in grip strength or unsteady gait. She does endorse going to the restroom more frequently but no dysuria or discharge.  ROS noted in HPI.    Social History   Tobacco Use  Smoking Status Former Smoker  . Packs/day: 0.10  . Types: Cigarettes  Smokeless Tobacco Never Used    Objective: BP 106/62   Pulse 62   SpO2 99%  Vitals and nursing notes reviewed  Physical Exam Gen: Alert and Oriented x 3, NAD HEENT: Normocephalic, atraumatic, PERRLA, EOMI CV: RRR, no murmurs, normal S1, S2 split Resp: CTAB, no wheezing, rales, or rhonchi, comfortable work of breathing Ext: no clubbing, cyanosis, or edema Neuro: CN II-XII intact, 5/5 strength on the right, 4/5 on the left for upper extremity, 5/5 bilateral LE strength, gross sensation intact Skin: warm, dry, intact, no rashes  Results for orders placed or performed in visit on 05/04/19 (from the past 72 hour(s))  POCT urinalysis dipstick     Status: Abnormal   Collection Time: 05/04/19 12:00 AM  Result Value Ref Range   Color, UA yellow yellow   Clarity, UA clear clear   Glucose, UA negative negative mg/dL   Bilirubin, UA negative negative   Ketones, POC UA trace (5) (A) negative mg/dL   Spec Grav, UA 1.025 1.010 - 1.025   Blood, UA negative negative   pH, UA 6.5 5.0 - 8.0   Protein Ur, POC negative negative mg/dL   Urobilinogen, UA 1.0 0.2 or 1.0 E.U./dL   Nitrite, UA Negative Negative   Leukocytes, UA Negative Negative    Assessment/Plan:  MS (multiple sclerosis) (HCC) Possible flare of her MS. No visual symptoms, loss of bladder control above baseline, or unsteady gait. - I have sent a message to her Neurologist to see if Dr. Felecia Shelling thinks she will benefit from a course of steroids. - Given the high doses I will not prescribe any at this time but discussed with her reasons to seek care that may require she receive IV steroids.  U/A for increased urinary frequency negative for signs of UTI.  PATIENT EDUCATION PROVIDED: See AVS    Diagnosis and plan along with any newly prescribed medication(s) were discussed in detail with this patient today. The patient verbalized understanding and agreed with the plan. Patient advised if symptoms worsen return to clinic or ER.    Orders Placed This Encounter  Procedures  . Urine Culture  . POCT urinalysis dipstick     Harolyn Rutherford, DO 05/04/2019, 4:17 PM PGY-3 [place]

## 2019-05-04 NOTE — ED Provider Notes (Signed)
[facility]   CSN: [id number] Arrival date & time: 05/04/19  [id number]     History   Chief Complaint Chief Complaint  Patient presents with   Eye Problem   MS    HPI Alicia Barker is a 33 y.o. female with history of multiple sclerosis, depression presents to the ER for evaluation of chest discomfort.  This began on Tuesday/Wednesday of last week.  Describes intermittent, temporary chest "pressure" to the right side of her chest sometimes radiates to the left and bounces back-and-forth.  It is associated with shortness of breath described as breathlessness.  Symptoms are worst with prolonged talking and walking and moving around.  States she accidentally drove over a curb the day before her symptoms started and since she has had a left-sided headache that radiates to the front of her head with intermittent left-sided neck pains.  She went to her PCP earlier today who listened to her lungs and told her her lungs were clear.  States they did "a bunch of tests" but they did not give her a diagnosis.  No interventions for this.  No alleviating factors.  States she does not think he has chest COVID-19 because he has been tested twice.  No exposures to this.  No travel.  History of tobacco use but quit 1 week ago.  She has no personal history of lung disease such as asthma or COPD.  No personal history of CAD.  No history of DVT or PE.  No recent surgeries, prolonged travel or immobilization, hormone use, hemoptysis, leg swelling or calf pain, active cancer.  Of note, initial triage note documented patient presented to the ER for visual problems with her right eye for 1 week with blurred vision.  I asked patient about this and states "I was just telling them everything that was wrong with me".  States she is not here for her vision problems and that she can follow-up with her neurologist about this.  She is here for her chest discomfort.  No associated  fever, congestion, sore throat, cough, sputum, palpitations, nausea, vomiting, diaphoresis, syncope, leg swelling.  HPI: A 33 year old patient presents for evaluation of chest pain. Initial onset of pain was less than one hour ago. The patient's chest pain is described as heaviness/pressure/tightness and is worse with exertion. The patient's chest pain is not middle- or left-sided, is not well-localized, is not sharp and does not radiate to the arms/jaw/neck. The patient does not complain of nausea and denies diaphoresis. The patient has smoked in the past 90 days. The patient has no history of stroke, has no history of peripheral artery disease, denies any history of treated diabetes, has no relevant family history of coronary artery disease (first degree relative at less than age 34), is not hypertensive, has no history of hypercholesterolemia and does not have an elevated BMI (>=30).   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) 07/11/2018   Attention deficit disorder    Class 1 obesity due to excess calories without serious comorbidity with body mass index (BMI) of 30.0 to 30.9 in adult    Stress incontinence 07/25/2017   Neck pain 03/12/2017   Acute upper back  pain 12/23/2016   Gait disturbance 06/25/2016   Dysuria 04/10/2016   Homeless family 04/10/2016   Healthcare maintenance 04/10/2016   Depression 03/28/2016   Multiple sclerosis exacerbation (HCC) 02/22/2016   Weakness    Diplopia    Left hip pain 01/18/2016   Left sided sciatica 01/18/2016   Numbness 11/28/2015   Ataxia 11/28/2015   Visual disturbance 11/28/2015   Other fatigue 11/28/2015   Urinary frequency 11/28/2015   High risk medication use 11/28/2015   Chest pain 11/25/2015    Demyelinating changes in brain [facility])    Dizziness    MS (multiple sclerosis) (HCC) 11/15/2015    Past Surgical History:  Procedure Laterality Date   IR FLUORO GUIDE CV LINE RIGHT  07/16/2018   IR REMOVAL TUN CV CATH W/O FL  08/04/2018   IR US GUIDE VASC ACCESS RIGHT  07/16/2018   LAPAROSCOPIC TUBAL LIGATION Bilateral 01/06/2013   Procedure: LAPAROSCOPIC TUBAL LIGATION;  Surgeon: Kathreen Cosier, MD;  Location: [facility];  Service: Gynecology;  Laterality: Bilateral;   right hand surgery       OB History    Gravida  5   Para  5   Term  4   Preterm  1   AB  0   Living  5     SAB  0   TAB  0   Ectopic  0   Multiple  0   Live Births  5            Home Medications    Prior to Admission medications   Medication Sig Start Date End Date Taking? Authorizing Provider  acetaminophen-codeine (TYLENOL #3) 300-30 MG tablet TAKE 1 TABLET BY MOUTH THREE TIMES DAILY Patient taking differently: Take 1 tablet by mouth 3 (three) times daily.  03/23/19  Yes Sater, Pearletha Furl, MD  amphetamine-dextroamphetamine (ADDERALL) 10 MG tablet Take 1 tablet (10 mg total) by mouth 2 (two) times daily. 12/17/18  Yes Sater, Pearletha Furl, MD  cyclobenzaprine (FLEXERIL) 5 MG tablet TAKE 1 TABLET BY MOUTH EVERY NIGHT AT BEDTIME. TAKE UP TO THREE TIMES DAILY Patient taking differently: Take 5 mg by mouth as needed for muscle spasms.  03/20/18  Yes Sater, Pearletha Furl, MD  ibuprofen (ADVIL,MOTRIN) 800 MG tablet TAKE 1 TABLET BY MOUTH AS NEEDED Patient taking differently: Take 800 mg by mouth every 6 (six) hours as needed for mild pain.  08/29/18  Yes Wendee Beavers, DO  meclizine (ANTIVERT) 25 MG tablet Take 1 tablet (25 mg total) by mouth 3 (three) times daily as needed for dizziness. 03/01/16  Yes Sater, Pearletha Furl, MD  ocrelizumab 600 mg in sodium chloride 0.9 % 500 mL Inject 600 mg into the vein every 6 (six) months.   Yes [provider]  ondansetron (ZOFRAN) 4 MG tablet Take 1 tablet (4 mg  total) by mouth as needed for nausea or vomiting. 03/28/18  Yes Wurst, [place], PA-C  predniSONE (DELTASONE) 50 MG tablet 10 po x 1 day (500 g) then 8 po x 1d then 6 po x 1d, then 4 po x 1d, then 2 po x1d, then 1 po x 1d, then 1/2 po x 1d Patient not taking: Reported on 05/05/2019 03/04/19   Asa Lente, MD    Family History Family History  Problem Relation Age of Onset   Diabetes Mother    Heart disease Mother    Diabetes Father    Multiple sclerosis Cousin     Social History Social History  Tobacco Use   Smoking status: Former Smoker    Packs/day: 0.10    Types: Cigarettes   Smokeless tobacco: Never Used  Substance Use Topics   Alcohol use: No    Alcohol/week: 0.0 standard drinks   Drug use: No     Allergies   Penicillins and Zoloft [sertraline hcl]   Review of Systems Review of Systems  Respiratory: Positive for chest tightness and shortness of breath.   Cardiovascular: Positive for chest pain.  All other systems reviewed and are negative.    Physical Exam Updated Vital Signs BP 100/64    Pulse 63    Temp 98.4 F (36.9 C) (Oral)    Resp 20    Ht 5\' 4"  (1.626 m)    Wt 102.5 kg    LMP 04/27/2019 (Exact Date)    SpO2 98%    BMI 38.79 kg/m   Physical Exam Constitutional:      Appearance: She is well-developed.     Comments: NAD. Non toxic.   HENT:     Head: Normocephalic and atraumatic.     Nose: Nose normal.  Eyes:     General: Lids are normal.     Conjunctiva/sclera: Conjunctivae normal.  Neck:     Musculoskeletal: Normal range of motion.     Trachea: Trachea normal.     Comments: Trachea midline.  Cardiovascular:     Rate and Rhythm: Normal rate and regular rhythm.     Pulses:          Radial pulses are 1+ on the right side and 1+ on the left side.       Dorsalis pedis pulses are 1+ on the right side and 1+ on the left side.     Heart sounds: Normal heart sounds, S1 normal and S2 normal.     Comments: No LE edema or calf tenderness.    Pulmonary:     Effort: Pulmonary effort is normal.     Breath sounds: Normal breath sounds.     Comments: Speaking in full sentences. No increased work of breathing.  Abdominal:     General: Bowel sounds are normal.     Palpations: Abdomen is soft.     Comments: No epigastric tenderness. No distention.   Skin:    General: Skin is warm and dry.     Capillary Refill: Capillary refill takes less than 2 seconds.  Neurological:     Mental Status: She is alert.     GCS: GCS eye subscore is 4. GCS verbal subscore is 5. GCS motor subscore is 6.     Comments: Sensation and strength is intact in upper/lower extremities   Psychiatric:        Speech: Speech normal.        Behavior: Behavior normal.        Thought Content: Thought content normal.      ED Treatments / Results  Labs (all labs ordered are listed, but only abnormal results are displayed) Labs Reviewed  CBC - Abnormal; Notable for the following components:      Result Value   RBC 3.82 (*)    Hemoglobin 11.1 (*)    HCT 33.5 (*)    All other components within normal limits  COMPREHENSIVE METABOLIC PANEL - Abnormal; Notable for the following components:   Potassium 3.4 (*)    Glucose, Bld 101 (*)    Total Protein 6.3 (*)    All other components within normal limits  DIFFERENTIAL  D-DIMER, QUANTITATIVE (  NOT AT [facility])  I-STAT BETA HCG BLOOD, ED ([facility], [facility], [facility] ONLY)  TROPONIN I (HIGH SENSITIVITY)  TROPONIN I (HIGH SENSITIVITY)    EKG EKG Interpretation  Date/Time:  Wednesday May 05 2019 02:02:18 EDT Ventricular Rate:  69 PR Interval:    QRS Duration: 99 QT Interval:  402 QTC Calculation: 431 R Axis:   4 Text Interpretation:  Sinus arrhythmia RSR' in V1 or V2, probably normal variant Probable left ventricular hypertrophy No significant change since last tracing Confirmed by Rochele Raring [phone]) on 05/05/2019 2:31:21 AM   Radiology Dg Chest 2 View  Result Date: 05/04/2019 CLINICAL DATA:  Chest pressure,  shortness of breath EXAM: CHEST - 2 VIEW COMPARISON:  11/22/2015 FINDINGS: Heart and mediastinal contours are within normal limits. No focal opacities or effusions. No acute bony abnormality. IMPRESSION: No active cardiopulmonary disease. Electronically Signed   By: Charlett Nose M.D.   On: 05/04/2019 23:49   Ct Head Wo Contrast  Result Date: 05/04/2019 CLINICAL DATA:  Visual disturbance/blurred vision. History of multiple sclerosis. EXAM: CT HEAD WITHOUT CONTRAST TECHNIQUE: Contiguous axial images were obtained from the base of the skull through the vertex without intravenous contrast. COMPARISON:  October 14, 2015 FINDINGS: Brain: Ventricles and sulci are within normal limits. Cisterna magna is prominent, an anatomic variant. There is no evident intracranial mass, hemorrhage, extra-axial fluid collection, or midline shift. The brain parenchyma appears unremarkable. No evidence of acute infarct. No demyelination is evident noncontrast enhanced CT examination. Vascular: There is no hyperdense vessel. There is no appreciable vascular calcification. Skull: Bony calvarium appears intact. Sinuses/Orbits: Visualized paranasal sinuses are clear. Orbits appear symmetric bilaterally. Other: Mastoid air cells are clear. IMPRESSION: Study within normal limits. Electronically Signed   By: Bretta Bang III M.D.   On: 05/04/2019 20:27    Procedures Procedures (including critical care time)  Medications Ordered in ED Medications - No data to display   Initial Impression / Assessment and Plan / ED Course  I have reviewed the triage vital signs and the nursing notes.  Pertinent labs & imaging results that were available during my care of the patient were reviewed by me and considered in my medical decision making (see chart for details).  Clinical Course as of May 04 330  Wed May 05, 2019  0157 D-Dimer, Quant: <0.27 [CG]  0157 Troponin I (High Sensitivity): <2 [CG]  0328 Troponin I (High Sensitivity): 3  [CG]  0328 EKG reviewed by me and EDP shows sinus arrhythmia and borderline LVH.   [CG]    Clinical Course User Index [CG] Liberty Handy, PA-C   Patient's EMR reviewed.  33 year old is here with what sounds like atypical chest pain.  Associated with breathlessness, when talking, with exertion.  No other symptoms.  Symptoms began after driving over a curb on her car.  Has been tested twice before for COVID-19 and denies any recent exposure, travel.  She has no infectious symptoms.  Chest pain is nonpleuritic, non-positional.  No palpitations, lightheadedness, syncope, peripheral edema or calf tenderness.  Cardiac risk factors include tobacco use.  Physical exam is reassuring.  No tachycardia, tachypnea or hypoxemia.  Her Wells score is 0.  PERC negative.  ER work-up reviewed by me.  Troponin 2>3.  EKG without ischemic abnormalities, right heart strain.  D-dimer is negative.  Chest x-ray is without pneumothorax, pulmonary edema, infiltrates, cardiomegaly.  She is no infectious symptomatology and has been tested for COVID-19 twice before and I do not think we need to  repeat this today in the ER.  Heart score is less than 3.  History, exam, work-up is reassuring.  Given this and her heart score think patient is appropriate for discharge with PCP follow-up.  Work-up is not suggestive of PE, pneumothorax, dissection, ACS.  Return precautions discussed.  Patient is comfortable with this.  Final Clinical Impressions(s) / ED Diagnoses   Final diagnoses:  Atypical chest pain    ED Discharge Orders    None       Kinnie Feil, PA-C 05/05/19 0331    Ward, Delice Bison, DO 05/05/19 [phone]

## 2019-05-05 ENCOUNTER — Other Ambulatory Visit: Payer: Self-pay

## 2019-05-05 LAB — D-DIMER, QUANTITATIVE: D-Dimer, Quant: <0.27 ug/mL-FEU (ref 0.00–0.50)

## 2019-05-05 LAB — TROPONIN I (HIGH SENSITIVITY)
Troponin I (High Sensitivity): 3 ng/L (ref <18)
Troponin I (High Sensitivity): <2 ng/L (ref <18)

## 2019-05-05 NOTE — Assessment & Plan Note (Signed)
Possible flare of her MS. No visual symptoms, loss of bladder control above baseline, or unsteady gait. - I have sent a message to her Neurologist to see if Dr. Felecia Shelling thinks she will benefit from a course of steroids. - Given the high doses I will not prescribe any at this time but discussed with her reasons to seek care that may require she receive IV steroids.

## 2019-05-05 NOTE — Discharge Instructions (Addendum)
You were evaluated in the emergency department for chest pain.  Work up today was normal. The cause of your symptoms is still unclear.   Based on your risk factors, work up and exam you are considered low risk for major adverse cardiac events in the next 30 days.  This means you can be discharged with close follow up with primary care follow up for further outpatient work up.   Call your doctor and make an appointment as soon as possible if symptoms persist.  Please return to ED if: Your chest pain is worse or on exertion You have a cough that gets worse, or you cough up blood. You have severe pain in chest, back or abdomen. You have chest pain or shortness of breath with exertion or activity You have sudden, unexplained discomfort in your chest, with radiation arms, back, neck, or jaw. You suddenly have chest pain and begin to sweat, or your skin gets clammy. You feel chest pain with nausea or vomiting. You suddenly feel light-headed or faint. Your heart begins to beat quickly, or it feels like it is skipping beats. You have one sided leg swelling or calf pain

## 2019-05-06 ENCOUNTER — Telehealth: Payer: Self-pay | Admitting: Neurology

## 2019-05-06 ENCOUNTER — Telehealth: Payer: Self-pay | Admitting: *Deleted

## 2019-05-06 NOTE — Telephone Encounter (Signed)
-----   Message from Britt Bottom, MD sent at 05/05/2019  5:55 PM EDT ----- Regarding: RE: MS Flare Timothy, With the numbness only in the head and associated with pain, an MS exacerbation is unlikely.  I will get her in to see me on Monday  Jorja Loa, please call her to get her on my schedule on Monday ----- Message ----- From: Nuala Alpha, DO Sent: 05/05/2019   9:21 AM EDT To: Britt Bottom, MD Subject: MS Flare                                       Hey Dr. Felecia Shelling,  I saw a mutual patient in my clinic yesterday, Ms Slavick for a possible MS flare. She is not having any visual symptoms but is complaining of a numbness and tingling sensation on the left side of her head with some associated sharp pains. She is concerned that she is having another MS flare. I did not start steroids as I am not used to giving out high doses to adequately treat MS flares. We did discuss she should seek emergency care if she does start to have visual symptoms as she may need IV steroids.  I just wanted to let you know in case you can get her into your clinic this week in the event you decide she would benefit from a course of steroids. Please feel free to call me with any questions. My cell number is [phone].  Harolyn Rutherford, DO [facility], PGY-3

## 2019-05-06 NOTE — Telephone Encounter (Signed)
Pt is asking if Dr Felecia Shelling will give her a referral to a Respiratory Dr, please call

## 2019-05-06 NOTE — Telephone Encounter (Signed)
Called, LVM for pt to call office back today. Advised we close at [place] today and closed on Friday's. Dr. Felecia Shelling would like to fit her in for an appt Monday 05/10/19. If she calls, can offer 2:30pm, check in 2:00pm

## 2019-05-06 NOTE — Telephone Encounter (Addendum)
Called pt. She did not receive my message from earlier. Scheduled visit with Dr. Felecia Shelling 05/10/19 at 230pm. Cx appt she had with AL,NP at 11am. She will discuss this further at appt with Dr. Felecia Shelling. She stated PCP wanted her to discuss referral for respiratory issues with Dr. Felecia Shelling.

## 2019-05-06 NOTE — Telephone Encounter (Signed)
Spoke with pt. See other phone note from today.

## 2019-05-10 ENCOUNTER — Telehealth: Payer: Self-pay | Admitting: *Deleted

## 2019-05-10 ENCOUNTER — Other Ambulatory Visit: Payer: Self-pay

## 2019-05-10 ENCOUNTER — Ambulatory Visit: Payer: Medicaid Other | Admitting: Neurology

## 2019-05-10 ENCOUNTER — Encounter: Payer: Self-pay | Admitting: Neurology

## 2019-05-10 ENCOUNTER — Other Ambulatory Visit (HOSPITAL_COMMUNITY)
Admission: RE | Admit: 2019-05-10 | Discharge: 2019-05-10 | Disposition: A | Discharge status: Home / Self Care | Payer: Medicaid Other | Source: Ambulatory Visit | Attending: Family Medicine | Admitting: Family Medicine

## 2019-05-10 ENCOUNTER — Ambulatory Visit (INDEPENDENT_AMBULATORY_CARE_PROVIDER_SITE_OTHER): Payer: Medicaid Other | Admitting: Family Medicine

## 2019-05-10 ENCOUNTER — Ambulatory Visit: Payer: Medicaid Other | Admitting: Family Medicine

## 2019-05-10 ENCOUNTER — Encounter: Payer: Self-pay | Admitting: Family Medicine

## 2019-05-10 VITALS — BP 118/72 | HR 72

## 2019-05-10 VITALS — BP 110/70 | HR 107 | Ht 64.0 in | Wt 226.5 lb

## 2019-05-10 DIAGNOSIS — Z79899 Other long term (current) drug therapy: Secondary | ICD-10-CM

## 2019-05-10 DIAGNOSIS — Z124 Encounter for screening for malignant neoplasm of cervix: Secondary | ICD-10-CM | POA: Diagnosis not present

## 2019-05-10 DIAGNOSIS — G35 Multiple sclerosis: Secondary | ICD-10-CM

## 2019-05-10 DIAGNOSIS — Z23 Encounter for immunization: Secondary | ICD-10-CM | POA: Diagnosis not present

## 2019-05-10 DIAGNOSIS — R350 Frequency of micturition: Secondary | ICD-10-CM

## 2019-05-10 DIAGNOSIS — H469 Unspecified optic neuritis: Secondary | ICD-10-CM

## 2019-05-10 DIAGNOSIS — Z01419 Encounter for gynecological examination (general) (routine) without abnormal findings: Secondary | ICD-10-CM | POA: Diagnosis not present

## 2019-05-10 DIAGNOSIS — H539 Unspecified visual disturbance: Secondary | ICD-10-CM | POA: Diagnosis not present

## 2019-05-10 DIAGNOSIS — R269 Unspecified abnormalities of gait and mobility: Secondary | ICD-10-CM | POA: Diagnosis not present

## 2019-05-10 NOTE — Addendum Note (Signed)
Addended by: Junious Dresser on: 05/10/2019 11:42 AM   Modules accepted: Orders

## 2019-05-10 NOTE — Progress Notes (Signed)
     Subjective: Chief Complaint  Patient presents with  . Gynecologic Exam    HPI: Alicia Barker is a 33 y.o. presenting to clinic today to discuss the following:  Pap Patient presents today requesting pap smear. She is having no discharge or bleeding and no pelvic pain. She has no complaints today and just came for her pap. No fever, chills, abdominal pain, nausea, vomiting, dysuria, or blood in the stool.  Health Maintenance: pap, influenza vaccine     ROS noted in HPI.    Social History   Tobacco Use  Smoking Status Former Smoker  . Packs/day: 0.10  . Types: Cigarettes  Smokeless Tobacco Never Used    Objective: LMP 04/27/2019 (Exact Date)  Vitals and nursing notes reviewed  Physical Exam Vitals signs reviewed. Exam conducted with a chaperone present.  Constitutional:      General: She is not in acute distress.    Appearance: Normal appearance. She is not ill-appearing.  Genitourinary:    General: Normal vulva.     Exam position: Supine.     Pubic Area: No rash.      Labia:        Right: No rash, tenderness, lesion or injury.        Left: No rash, tenderness, lesion or injury.      Vagina: Normal. No vaginal discharge.     Cervix: No cervical motion tenderness, discharge, lesion or cervical bleeding.     Uterus: Normal.   Skin:    General: Skin is warm and dry.     Findings: No erythema, lesion or rash.  Neurological:     Mental Status: She is alert.     Assessment/Plan:  Pap test, as part of routine gynecological examination Pap performed today for routine health maintenance per USPTF recommendations. - Will call patient with results if abnormal, otherwise will send a letter. - F/u in one year for annual wellness exam or sooner if she has any concerns.  PATIENT EDUCATION PROVIDED: See AVS    Diagnosis and plan along with any newly prescribed medication(s) were discussed in detail with this patient today. The patient verbalized understanding and  agreed with the plan. Patient advised if symptoms worsen return to clinic or ER.     Harolyn Rutherford, DO 05/10/2019, 8:55 AM PGY-3 [place]

## 2019-05-10 NOTE — Patient Instructions (Signed)

## 2019-05-10 NOTE — Assessment & Plan Note (Signed)
Pap performed today for routine health maintenance per USPTF recommendations.

## 2019-05-10 NOTE — Progress Notes (Signed)
[facility]  PATIENT: Alicia Barker DOB: 25-Sep-1985  REFERRING DOCTOR OR PCP:  PCP is Francoise Ceo SOURCE: patient, records from hospital, MRI / lab reports, MRI images on PACS  _________________________________   HISTORICAL  CHIEF COMPLAINT:  Chief Complaint  Patient presents with  . Follow-up    RM 12. Here for MS f/u. On Ocrevus. Having head pain/numbness.    HISTORY OF PRESENT ILLNESS:  Alicia Barker is a 33 y.o. woman who was diagnosed with MS in March 2017.     Update 05/10/2019:  She is having headaches and visual changes.   Pain is sharp and started on the left and now is on the right as well.   She notes more visual changes OD.  Colors are washed out.   Acuity is poor.    She is on Ocrevus.  The last infusion was in April.   She did have an exacerbation with right optic neuritis in July and had another ON (left) last year.    She never had the brain MRI ordered in July (plan was to stay on Ocrevus if unchanged or switch to a different DMT if significant changes).    In July we did one gram IV Solumedrol followed by high dose prednisone taper.    She did take Prednisone 100 mg x 2 days without benefit (still had some pills left over).    Her gait is a little more off balanced.     Update 03/04/2019: About 2 weeks ago, she began to note more visual problems out of the right eye.   She is unable to read small print.  Colors are less vibrant.     Her last Ocrevus injection was April and she has tolerated them well.     She noted vertigo initially as well.     She still has some vertigo but it is better.  She does not note any change in strength and there is no new numbness.  Of note, in November 2019 she had an exacerbation with optic neuritis on the left.  Even being treated with plasmapheresis after steroids she did not recover significant vision.  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   She has been on  Ocrevus for the past 2 years.  She has tolerated it fairly well but I discussed with her my concern that she might be having a second exacerbation while on it and exacerbations are very rare without medication.  Of note, earlier this year I checked the CD19 and CD20 and she did have 2% B cells.  IgG/IgM has been good.  She denies any in triggering event.  No infections.  Update 10/22/2018: She was admitted to [facility] 07/10/2018 due to left optic neuritis.  She received IV Solu-Medrol but had no improvement and then had plasmapheresis.  Unfortunately the improvement has been mild.  Currently, she can see some shapes and hand waving out of the left eye but cannot count fingers.  There is no color vision on the left.  She also had more difficulty with her gait and some clumsiness in the left arm and leg around the same time of the.  She feels her gait has improved close to the baseline before her heart lesion.  She has some bladder urgency.  I reviewed the MRI of the brain from 07/10/2018.  It showed a couple new lesions compared to 2017 (before she was on Ocrevus) but these  were not large enough to cause her symptoms.  There was no new MRI of the spine.  She does note more difficulties with depression.  Earlier this year she has some suicidal ideation but did not have any suicidal plan.  She is on sertraline 100 mg daily.  We discussed seeing counseling.  Additionally, she has more difficulty with fatigue.  She sleeps okay most nights.  She has not noted any change in cognition.  Update 01/19/2018:   She feels her MS is stable.  She denies any exacerbations.  She is on Ocrevus.  She tolerates it well.  Her last infusion was April 2019.  At the last visit we checked IgG/IgM and they were normal.  She notes some difficulty with her gait and some mild weakness in her legs.  The weakness is worse on the right than the left.  When she walks a while she will have a right foot drop.  She also notes some  right leg numbness and tingling.  She is able to walk without a cane.  Vision is fine.  She notes less urinary urgency incontinence on oxybutynin and Myrbetriq  She has some fatigue.  She sleeps well most nights.  She has mild depression, somewhat helped by Celexa.   She never did counseling.   She has had some attention deficit helped by Adderall (does not take every day).      She has fewer headaches.  When one occurs, she takes Tyl #3  Update 07/14/2017: She had her last ocrelizumab infusion at the end of July 2018. The next infusion is scheduled for 09/11/2017. She has tolerated the infusions well and has not had any definite exacerbations. Since her large exacerbation in 2017, she continues to have weakness in her legs.   Her right leg feels weaker than her left leg.   She sometimes notes a right foot drop.    She walks without a cane.    She is able to do some part time work.  She notes some right leg numbness and tingling.   She has some urinary incontinenc associated with urge.     Oxybutynin has helped a lot.     Mood is better recently but some days she feels hopeless.    She is still on an antidepressant and feels it helps.      From 03/12/2017 MS:  She switched from Tecfidera to [place] in December 2017/January 2018 and just had her second infusion.    She has no new exacerbation.  She was started on Tecfidera April 2017.    She was tolerating Tecfidera well but in August had more difficulty with balance and slurred speech.    MRI 03/25/2016 showed several new enhancing lesions c/w active demyelination   She received IV steroids and started Ocrevus, first dose in December.  .     Gait/strength/sensation:   Since the large exacerbation in 2017, She reports reports left > right leg weakness and clumsiness.   Gait is unsteady.   She has fallen a few times.  She uses a cane sometimes but no longer needs a walker. The right leg was weaker before the large exacerbation.   No hand numbness.    The  left leg has painful dysesthesias with a stabbing quality.     Gabapentin had not helped in the past    Bladder/bowel: She has more urinary frequency and urgency with occasional urinary incontinence. Oxybutynin has not helped much.   She has rare bowel  incontinence.     Vision/vertigo: She notes some visual blurring since last year.   She has some diplopia but this is better.  Fatigue/sleep: She notes a lot of fatigue, physical > mental.   Sleep is worse with more trouble staying asleep.   Her insomnia improved with nighttime cyclobenzaprine earlier this year but now she has more again.  Mood/cognition: She is having some depression and anxiety that worsened last year and has persisted despite escitalopram.     She gets irritable easily and has a short fuse.     She also reports apathy and poor sleep.  Cognitive processing speed appears to be slower.  HA:   She has pain in the temples and the neck since Monday.   Pain worsens when she stands or moves and is best when she is still.   She denies N/V but has photophobia and phonophobia.   This is her worst HA in years but similar to ones she had many years ago.   Tylenol #3 and Ibuprofen have not helped.     MS History:   In early March, 2017, she had the onset of slurred speech and leg weakness and ataxia.    A head CT was reportedly normal.    Over the next couple weeks, she had right worse than left visual acuity issues, more numbness and more clumsiness.        Her gait became unsteady.     She also noted mid back pain.    She went to the ER and was diagnosed with an inner ear problem.   She went back to ER 11/15/15 and was found to have an abnormal MRI consistent with MS.    MRI of the spine also showed additional plaques.     She received 5 days of IV Steroids and noticed an improvement with improved gait but continued numbness.    I have reviewed the MRIs of the brain and spine performed for 12/30/2015 and 11/17/2015. The MRI of the brain with and  without contrast shows multiple T2/FLAIR hyperintense foci, many in the periventricular white matter.    Another focus is the pons (with mild enhancement, better seen on c-spine MRI).Marland Kitchen. 7 foci enhanced after gadolinium administration imply more recent MS plaques.    There are at least 2 small T2 hyperintense foci within the cervical spine, one of which appears to enhance slightly.    Another enhancing focus is noted within the thoracic spine adjacent to T3-T4.   She started Tecfidera after I first saw her 11/28/2015.   She had a significant relapse and was switched over to ocrelizumab if her first dose around January or February 2018.                                                                                       REVIEW OF SYSTEMS: Constitutional: No fevers, chills, sweats, or change in appetite.  She reports fatigue and poor sleep  Eyes: mild right visual changes, double vision, eye pain Ear, nose and throat: No hearing loss, ear pain, nasal congestion, sore throat Cardiovascular: No chest pain, palpitations Respiratory: No shortness of breath at rest  or with exertion.   No wheezes GastrointestinaI: No nausea, vomiting, diarrhea, abdominal pain, fecal incontinence Genitourinary: No dysuria, urinary retention.   She has some urgency and frequency..  She has some nocturia. Musculoskeletal: Reports neck pain, back pain Integumentary: No rash, pruritus, skin lesions Neurological: as above Psychiatric: Notes depression > anxiety.    Endocrine: No palpitations, diaphoresis, change in appetite, change in weigh or increased thirst Hematologic/Lymphatic: No anemia, purpura, petechiae. Allergic/Immunologic: No itchy/runny eyes, nasal congestion, recent allergic reactions, rashes  ALLERGIES: Allergies  Allergen Reactions  . Penicillins Anaphylaxis, Shortness Of Breath and Swelling    Has patient had a PCN reaction causing immediate rash, facial/tongue/throat swelling, SOB or lightheadedness with  hypotension: YES Has patient had a PCN reaction causing severe rash involving mucus membranes or skin necrosis: NO Has patient had a PCN reaction that required hospitalization NO Has patient had a PCN reaction occurring within the last 10 years: NO If all of the above answers are "NO", then may proceed with Cephalosporin use.  . Zoloft [Sertraline Hcl]     Jittery per pt    HOME MEDICATIONS:  Current Outpatient Medications:  .  acetaminophen-codeine (TYLENOL #3) 300-30 MG tablet, TAKE 1 TABLET BY MOUTH THREE TIMES DAILY (Patient taking differently: Take 1 tablet by mouth 3 (three) times daily. ), Disp: 90 tablet, Rfl: 2 .  amphetamine-dextroamphetamine (ADDERALL) 10 MG tablet, Take 1 tablet (10 mg total) by mouth 2 (two) times daily., Disp: 60 tablet, Rfl: 0 .  cyclobenzaprine (FLEXERIL) 5 MG tablet, TAKE 1 TABLET BY MOUTH EVERY NIGHT AT BEDTIME. TAKE UP TO THREE TIMES DAILY (Patient taking differently: Take 5 mg by mouth as needed for muscle spasms. ), Disp: 90 tablet, Rfl: 11 .  ibuprofen (ADVIL,MOTRIN) 800 MG tablet, TAKE 1 TABLET BY MOUTH AS NEEDED (Patient taking differently: Take 800 mg by mouth every 6 (six) hours as needed for mild pain. ), Disp: 30 tablet, Rfl: 0 .  meclizine (ANTIVERT) 25 MG tablet, Take 1 tablet (25 mg total) by mouth 3 (three) times daily as needed for dizziness., Disp: 30 tablet, Rfl: 0 .  ocrelizumab 600 mg in sodium chloride 0.9 % 500 mL, Inject 600 mg into the vein every 6 (six) months., Disp: , Rfl:  .  ondansetron (ZOFRAN) 4 MG tablet, Take 1 tablet (4 mg total) by mouth as needed for nausea or vomiting., Disp: 3 tablet, Rfl: 0 .  predniSONE (DELTASONE) 50 MG tablet, 10 po x 1 day (500 g) then 8 po x 1d then 6 po x 1d, then 4 po x 1d, then 2 po x1d, then 1 po x 1d, then 1/2 po x 1d, Disp: 52 tablet, Rfl: 0 No current facility-administered medications for this visit.   Facility-Administered Medications Ordered in Other Visits:  .  vancomycin (VANCOCIN) 1,000  mg in sodium chloride 0.9 % 500 mL IVPB, 1,000 mg, Intravenous, Once, Villa Herb, PA-C  PAST MEDICAL HISTORY: Past Medical History:  Diagnosis Date  . Depression    no meds currently  . MS (multiple sclerosis) (HCC)   . SVD (spontaneous vaginal delivery)    x 5    PAST SURGICAL HISTORY: Past Surgical History:  Procedure Laterality Date  . IR FLUORO GUIDE CV LINE RIGHT  07/16/2018  . IR REMOVAL TUN CV CATH W/O FL  08/04/2018  . IR US GUIDE VASC ACCESS RIGHT  07/16/2018  . LAPAROSCOPIC TUBAL LIGATION Bilateral 01/06/2013   Procedure: LAPAROSCOPIC TUBAL LIGATION;  Surgeon: Kathreen Cosier, MD;  Location: [facility];  Service: Gynecology;  Laterality: Bilateral;  . right hand surgery      FAMILY HISTORY: Family History  Problem Relation Age of Onset  . Diabetes Mother   . Heart disease Mother   . Diabetes Father   . Multiple sclerosis Cousin     SOCIAL HISTORY:  Social History   Socioeconomic History  . Marital status: Married    Spouse name: Not on file  . Number of children: Not on file  . Years of education: Not on file  . Highest education level: Not on file  Occupational History  . Not on file  Social Needs  . Financial resource strain: Not on file  . Food insecurity    Worry: Not on file    Inability: Not on file  . Transportation needs    Medical: Not on file    Non-medical: Not on file  Tobacco Use  . Smoking status: Former Smoker    Packs/day: 0.10    Types: Cigarettes  . Smokeless tobacco: Never Used  Substance and Sexual Activity  . Alcohol use: No    Alcohol/week: 0.0 standard drinks  . Drug use: No  . Sexual activity: Yes    Birth control/protection: None  Lifestyle  . Physical activity    Days per week: Not on file    Minutes per session: Not on file  . Stress: Not on file  Relationships  . Social Musicianconnections    Talks on phone: Not on file    Gets together: Not on file    Attends religious service: Not on file    Active member  of club or organization: Not on file    Attends meetings of clubs or organizations: Not on file    Relationship status: Not on file  . Intimate partner violence    Fear of current or ex partner: Not on file    Emotionally abused: Not on file    Physically abused: Not on file    Forced sexual activity: Not on file  Other Topics Concern  . Not on file  Social History Narrative  . Not on file     PHYSICAL EXAM  Vitals:   05/10/19 1416  BP: 110/70  Pulse: (!) 107  SpO2: 98%  Weight: 226 lb 8 oz (102.7 kg)  Height: 5\' 4"  (1.626 m)    Body mass index is 38.88 kg/m.   General: The patient is well-developed and well-nourished and in no acute distress.   She has no rashes.   She has left optic disc pallor    Neurologic Exam  Mental status: The patient is alert and oriented x 3 at the time of the examination. The patient has apparent normal recent and remote memory, with an apparently normal attention span and concentration ability.   Speech is normal.  Cranial nerves: Extraocular movements are full.  2+ left APD.   She is 20/200 OS and 20/40 OD.  Facial strength and sensation is normal. Trapezius and sternocleidomastoid strength is normal. No dysarthria is noted.  The tongue is midline, and the patient has symmetric elevation of the soft palate. No obvious hearing deficits are noted.  Motor:  Muscle bulk is normal.   Muscle tone is normal.  Strength was 5/5 in the arms and the right leg in the proximal left leg but 4+/5 in the left ankle and toe extensors  Sensory:   Vibration sensation and touch sensation is normal and symmetric in the legs. Coordination: Finger-nose-finger  is normal. Heel-to-shin is reduced in the legs, worse on the left.   Gait and station: Station is normal.  The left foot drop and a wide gait.  She cannot do a tandem walk.  Romberg is negative.   Reflexes: Deep tendon reflexes are symmetric and normal bilaterally.           DIAGNOSTIC DATA (LABS, IMAGING,  TESTING) - I reviewed patient records, labs, notes, testing and imaging myself where available.  Lab Results  Component Value Date   WBC 5.3 05/04/2019   HGB 11.1 (L) 05/04/2019   HCT 33.5 (L) 05/04/2019   MCV 87.7 05/04/2019   PLT 370 05/04/2019      Component Value Date/Time   NA 138 05/04/2019 1900   K 3.4 (L) 05/04/2019 1900   CL 104 05/04/2019 1900   CO2 23 05/04/2019 1900   GLUCOSE 101 (H) 05/04/2019 1900   BUN 9 05/04/2019 1900   CREATININE 0.82 05/04/2019 1900   CALCIUM 9.1 05/04/2019 1900   PROT 6.3 (L) 05/04/2019 1900   PROT 6.5 04/16/2016 1119   ALBUMIN 3.8 05/04/2019 1900   ALBUMIN 3.9 04/16/2016 1119   AST 19 05/04/2019 1900   ALT 22 05/04/2019 1900   ALKPHOS 57 05/04/2019 1900   BILITOT 0.3 05/04/2019 1900   BILITOT <0.2 04/16/2016 1119   GFRNONAA >60 05/04/2019 1900   GFRAA >60 05/04/2019 1900       ASSESSMENT AND PLAN    1. Multiple sclerosis exacerbation (HCC)   2. Optic neuritis due to multiple sclerosis (HCC)   3. Visual disturbance   4. Urinary frequency   5. High risk medication use   6. Gait disturbance      1.    She has a possible small MS exacerbation with right ON (vs. a fluctuation as no major change in [place] noted on exam).  We will do 3 days of IV Solumedrol.  We will check an MRI of the brain and compare with her previous MRI.  We need to consider a switch in therapy.  Generally patients have done very well with Ocrevus.  Exacerbations and MRI changes are not common over the short-term.  We would need to consider a change to [place] or S1P-R modulatory pill.  She is JCV high titer positive so Tysabri would not be a good option for her long-term.    2.    Continue other medications. 3.   Return in 3 months or sooner if there are new or worsening neurologic symptoms. We discussed symptoms of  possible exacerbations.   [place] Searles A. Epimenio Foot, MD, PhD 05/10/2019, 5:56 PM Certified in Neurology, Clinical Neurophysiology, Sleep Medicine, Pain  Medicine and Neuroimaging  [facility] [address], [address] [place], [place] [address] [phone]

## 2019-05-10 NOTE — Telephone Encounter (Signed)
Gave signed orders to intrafusion for IV solumedrol x3 days (pt getting first day today). Pt has Medicaid and denies any insurance changes.

## 2019-05-11 DIAGNOSIS — R87612 Low grade squamous intraepithelial lesion on cytologic smear of cervix (LGSIL): Secondary | ICD-10-CM | POA: Diagnosis not present

## 2019-05-14 LAB — CYTOLOGY - PAP
High risk HPV: NEGATIVE
Molecular Disclaimer: 56
Molecular Disclaimer: NORMAL

## 2019-05-24 ENCOUNTER — Telehealth: Payer: Self-pay | Admitting: Neurology

## 2019-05-24 NOTE — Telephone Encounter (Signed)
I called pt that per Dr. Felecia Shelling the steroids sometimes take longer to kick in. Since it has been a few days hopefully that will be soon.Once she gets her MRI on 06/03/2019 he will look at the results. Pt verbalized understanding.

## 2019-05-24 NOTE — Telephone Encounter (Signed)
Called pt back. Blurry vision developing in right eye again after receiving IV steroids 05/10/19-05/12/19. About the same as last OV. Having to wear sunglasses d/t sensitivity to light. Has not started any new medications, no signs of infection. No exposure to anyone with covid-19.   Pt has not completed MRI brain that was ordered 03/04/2019. She will call [facility] at [phone] to schedule while I speak with Dr. Felecia Shelling. Advised I will call her back with recommendation.

## 2019-05-24 NOTE — Telephone Encounter (Signed)
Dr. Sater- please advise 

## 2019-05-24 NOTE — Telephone Encounter (Signed)
Ok..the steroids sometimes take longer to kick in. But since she had a few days, hopefully that will be soon.   I will take a look at the [facility] after it is done

## 2019-05-24 NOTE — Telephone Encounter (Signed)
Pt called stating that her eye site is getting blurry again and she would like to know if she can get another steroid shot. Please advise.

## 2019-06-03 ENCOUNTER — Ambulatory Visit
Admission: RE | Admit: 2019-06-03 | Discharge: 2019-06-03 | Disposition: A | Discharge status: Home / Self Care | Payer: Medicaid Other | Source: Ambulatory Visit | Attending: Neurology | Admitting: Neurology

## 2019-06-03 ENCOUNTER — Other Ambulatory Visit: Payer: Medicaid Other

## 2019-06-03 DIAGNOSIS — G35 Multiple sclerosis: Secondary | ICD-10-CM

## 2019-06-03 MED ORDER — GADOBENATE DIMEGLUMINE 529 MG/ML IV SOLN
20.0000 mL | Freq: Once | INTRAVENOUS | Status: AC | PRN
  Administered 2019-06-03: 11:00:00 20 mL via INTRAVENOUS

## 2019-06-07 ENCOUNTER — Telehealth: Payer: Self-pay | Admitting: *Deleted

## 2019-06-07 NOTE — Telephone Encounter (Signed)
-----   Message from Britt Bottom, MD sent at 06/03/2019  6:52 PM EDT ----- Please let her know that the MRI of the brain did not show any new MS lesions.

## 2019-06-07 NOTE — Telephone Encounter (Signed)
Called, LVM for pt about results per Dr. Felecia Shelling note. Provided [facility] phone # if she has further questions.

## 2019-06-11 ENCOUNTER — Other Ambulatory Visit: Payer: Self-pay

## 2019-06-11 ENCOUNTER — Ambulatory Visit (INDEPENDENT_AMBULATORY_CARE_PROVIDER_SITE_OTHER): Payer: Medicaid Other | Admitting: Family Medicine

## 2019-06-11 ENCOUNTER — Encounter: Payer: Self-pay | Admitting: Family Medicine

## 2019-06-11 VITALS — BP 118/80 | HR 93 | Wt 227.0 lb

## 2019-06-11 DIAGNOSIS — R0602 Shortness of breath: Secondary | ICD-10-CM

## 2019-06-11 DIAGNOSIS — J302 Other seasonal allergic rhinitis: Secondary | ICD-10-CM

## 2019-06-11 MED ORDER — FLUTICASONE PROPIONATE 50 MCG/ACT NA SUSP: 2.0000 | Freq: Every day | NASAL | 6 refills | Status: DC

## 2019-06-11 NOTE — Patient Instructions (Addendum)
Thank you for coming to see me today. It was a pleasure. Today we talked about:   Allergies Treatment - you should:   use over the counter antihistamines such as Zyrtec (cetirizine), Claritin (loratadine), Allegra (fexofenadine), or Xyzal (levocetirizine) daily   Nasal saline spray (i.e., Simply Saline) or nasal saline lavage (i.e., NeilMed) is recommended as needed and prior to medicated nasal sprays  Use a nasal steroid called Flonase: 2 sprays in each nostril every day Call us or go to the ER if you have severe shortness of breath or high fever or are not better in 2 weeks  Please follow-up with your PCP in 1 month via telemedicine visit to discuss symptoms and assess improvement.  I have placed a referral to the pulmonologist for pulmonary function tests to assess for asthma.   If you have any questions or concerns, please do not hesitate to call the office at [phone].  Take Care,  Dr. Mina Marble, DO Resident Physician [place] [phone]

## 2019-06-11 NOTE — Progress Notes (Signed)
Subjective:   Patient ID: Alicia Barker    DOB: 1985/10/11, 33 y.o. female   MRN: [id number]  Alicia Barker is a 33 y.o. female with a history of MS, ADD, obesity, depression here for allergy referral.  Patient notes she has a long history of seasonal allergies, worse in the fall. She notes itchy nose, ears, eyes and and postnasal drip. Has never treated her allergies with OTC medicines. Does note she has been seen once before where she was given a "steroid shot" which helped her symptoms. She notes her last allergy flare she had difficulty breathing due to congestion and cough. She thinks it is environmental allergies. Denies any current symptoms such as shortness of breath, cough, congestion, rhinorrhea. She does note waking up with some chest congestion that improves throughout the day. Peppermints help her symptoms too.   Notes she had a history of asthma and eczema as a child. Denies any need for inhalers as an adult. Notes strong family history asthma. Her child has asthma.  She endorses "heavy breathing" when she exerts herself. Denies any SOB with normal activity. This improves with a peppermint and a hot shower (the steam improves her symptoms). Has never used an inhaler. Denies any chest pain. She notes she has some of these symptoms when she is exposed to smoke, perfume, or dust. This occurs 2-3 times per year.   She is up to date on her flu shot.   Review of Systems:  Per HPI.   [place], medications and smoking status reviewed.  Objective:   BP 118/80   Pulse 93   Wt 227 lb (103 kg)   SpO2 98%   BMI 38.96 kg/m  Vitals and nursing note reviewed.  General: pleasant young female, well nourished, well developed, in no acute distress with non-toxic appearance HEENT: normocephalic, atraumatic, moist mucous membranes, oropharynx erythematous with cobblestoning, no exudate or enlarged tonsils, uvula midline  CV: regular rate and rhythm without murmurs, rubs, or gallops  Lungs: clear to auscultation bilaterally with normal work of breathing on room air Skin: warm, dry Extremities: warm and well perfused MSK:  gait normal Neuro: Alert and oriented, speech normal  Assessment & Plan:   Seasonal allergies Endorses history of seasonal allergy symptoms for years that occurs primarily in the fall season. Has not tried OTC antihistamines or Flonase. After long discussion of treatment vs allergy referral - opted to treat at this time.  - Recommend environmental control measures including avoiding tobacco smoke or perfumes - Recommend over the counter antihistamines such as Zyrtec (cetirizine), Claritin (loratadine), Allegra (fexofenadine), or Xyzal (levocetirizine) daily during peak season (up to BID if needed), then as needed.  - Recommend Flonase: 2 sprays per nostril QD - Nasal saline spray (i.e., Simply Saline) or nasal saline lavage (i.e., NeilMed) is recommended as needed and prior to medicated nasal sprays - RTC in 1 month via telehealth visit for follow up  - If no improvement with adequate treatment with above, can consider referral to allergiest   Shortness of breath on exertion Patient has personal history of asthma and eczema as a child and strong family history of asthma. She has never required inhaler for symptomatic relief. Endorsed some difficulty with breathing and "chest heaviness" with physical exertion without chest pain that improves with "pepperment and hot shower" that occurs 2-3 times per year. This is concerning for exercise induced asthma. Patient does have a history of MS that is well controlled at this time, however this does  clowd the picture to her symptoms. Do believe PFT's would be informative as she may benefit from as needed Symbicort. Given that [facility] is currently not able to conduct PFT's due to COVID, will provide referral to pulmonologist for further evaluation.  - ambulatory referral to pulmonologist - recommend environmental control  measures - Follow up in 1 month for telehealth visit, sooner as needed  Orders Placed This Encounter  Procedures  . Ambulatory referral to Pulmonology    Referral Priority:   Routine    Referral Type:   Consultation    Referral Reason:   Specialty Services Required    Requested Specialty:   Pulmonary Disease    Number of Visits Requested:   1   Meds ordered this encounter  Medications  . fluticasone (FLONASE) 50 MCG/ACT nasal spray    Sig: Place 2 sprays into both nostrils daily.    Dispense:  16 g    Refill:  6     Orpah Cobb, DO PGY-2, [facility] 06/13/2019 1:59 PM

## 2019-06-13 DIAGNOSIS — R0602 Shortness of breath: Secondary | ICD-10-CM | POA: Insufficient documentation

## 2019-06-13 DIAGNOSIS — J302 Other seasonal allergic rhinitis: Secondary | ICD-10-CM | POA: Insufficient documentation

## 2019-06-13 NOTE — Assessment & Plan Note (Addendum)
Patient has personal history of asthma and eczema as a child and strong family history of asthma. She has never required inhaler for symptomatic relief. Endorsed some difficulty with breathing and "chest heaviness" with physical exertion without chest pain that improves with "pepperment and hot shower" that occurs 2-3 times per year. This is concerning for exercise induced asthma. Patient does have a history of MS that is well controlled at this time, however this does clowd the picture to her symptoms. Do believe PFT's would be informative as she may benefit from as needed Symbicort. Given that [facility] is currently not able to conduct PFT's due to COVID, will provide referral to pulmonologist for further evaluation.  - ambulatory referral to pulmonologist - recommend environmental control measures - Follow up in 1 month for telehealth visit, sooner as needed

## 2019-06-13 NOTE — Assessment & Plan Note (Signed)
Endorses history of seasonal allergy symptoms for years that occurs primarily in the fall season. Has not tried OTC antihistamines or Flonase. After long discussion of treatment vs allergy referral - opted to treat at this time.  - Recommend environmental control measures including avoiding tobacco smoke or perfumes - Recommend over the counter antihistamines such as Zyrtec (cetirizine), Claritin (loratadine), Allegra (fexofenadine), or Xyzal (levocetirizine) daily during peak season (up to BID if needed), then as needed.  - Recommend Flonase: 2 sprays per nostril QD - Nasal saline spray (i.e., Simply Saline) or nasal saline lavage (i.e., NeilMed) is recommended as needed and prior to medicated nasal sprays - RTC in 1 month via telehealth visit for follow up  - If no improvement with adequate treatment with above, can consider referral to allergiest

## 2019-06-22 ENCOUNTER — Telehealth: Payer: Self-pay | Admitting: Neurology

## 2019-06-22 NOTE — Telephone Encounter (Signed)
Tried calling pt, went to VM, VM full. Sent SMS notification that included our office number.

## 2019-06-22 NOTE — Telephone Encounter (Signed)
Called pt back. She states she is feeling ok. She came today for infusion but it is tomorrow. She will be back tomorrow.

## 2019-06-22 NOTE — Telephone Encounter (Signed)
Patient paged on call physician, but I failed to reach her by [phone]  She has problems with medications, not feeling well, please call patient.

## 2019-06-23 ENCOUNTER — Telehealth: Payer: Self-pay | Admitting: Neurology

## 2019-06-23 ENCOUNTER — Other Ambulatory Visit: Payer: Self-pay | Admitting: Neurology

## 2019-06-23 MED ORDER — ACETAMINOPHEN-CODEINE #3 300-30 MG PO TABS: 1.0000 | ORAL_TABLET | Freq: Three times a day (TID) | ORAL | 2 refills | Status: DC

## 2019-06-23 NOTE — Telephone Encounter (Signed)
Pt is needing a refill on her acetaminophen-codeine (TYLENOL #3) 300-30 MG tablet sent to the [facility] on [address].

## 2019-07-19 ENCOUNTER — Other Ambulatory Visit: Payer: Self-pay

## 2019-07-19 ENCOUNTER — Telehealth (INDEPENDENT_AMBULATORY_CARE_PROVIDER_SITE_OTHER): Payer: Medicaid Other | Admitting: Family Medicine

## 2019-07-19 DIAGNOSIS — J302 Other seasonal allergic rhinitis: Secondary | ICD-10-CM

## 2019-07-19 NOTE — Progress Notes (Signed)
[facility] Telemedicine Visit  Patient consented to have virtual visit. Method of visit: Video was attempted, but technology challenges prevented patient from using video, so visit was conducted via telephone.  Encounter participants: Patient: Alicia Barker - located at home Provider: Danna Hefty - located at [facility] Others (if applicable): None  Chief Complaint: Allergy Follow up  HPI: Patient notes she is unable to talk long as she forgot about this appointment. She has time to discuss her allergies. She notes she is using the Flonase daily. She notes it has been helping a little. Her symptoms improved a lot initially but have flared up again. She was unaware of needing to use an antihistamine so she has only been using the Flonase.   Patient also notes she has not heard back from the pulmonologist for an appointment.   ROS: per HPI  Pertinent PMHx: Seasonal allergies  Exam:  Gen: Pleasant younger female, no acute distress Respiratory: Speaking in full sentences  Assessment/Plan:  Seasonal allergies Symptoms somewhat improved with daily use of Flonase. Patient did not start OTC antihistamine. Discussed again importance of daily antihistamine with Flonase to help with symptoms. Patient plans to stop by pharmacy at earliest convenience. - Continue environmental control measures.   - Recommend over the counter antihistamines such as Zyrtec (cetirizine), Claritin (loratadine), Allegra (fexofenadine), or Xyzal (levocetirizine) daily as needed. May take it twice a day if needed. - Nasal saline spray (i.e., Simply Saline) or nasal saline lavage (i.e., NeilMed) is recommended as needed and prior to medicated nasal sprays. - RTC if no improvement with above measures. Patient understands and agrees to plan.   History of SOB on Exertion: Messaged Jackie to follow up on status of pulmonology referral.   Patient also had some questions about her recent  pap-smear. She noted that both her mother and grandmother had abnormal pap-smears in the past. She is concerned and is wondering if she should get the biopsy sooner than later. Unable to locate information regarding discussion of her pap-smear results with her PCP. Given she had questions, scheduled patient for follow up visit with her PCP in January 2021 to allow adequate time to answer her questions and concerns more thoroughly.   Time spent during visit with patient: 20 minutes  Mina Marble, [place], PGY2 07/19/2019

## 2019-07-20 NOTE — Assessment & Plan Note (Signed)
Symptoms somewhat improved with daily use of Flonase. Patient did not start OTC antihistamine. Discussed again importance of daily antihistamine with Flonase to help with symptoms. Patient plans to stop by pharmacy at earliest convenience. - Continue environmental control measures.   - Recommend over the counter antihistamines such as Zyrtec (cetirizine), Claritin (loratadine), Allegra (fexofenadine), or Xyzal (levocetirizine) daily as needed. May take it twice a day if needed. - Nasal saline spray (i.e., Simply Saline) or nasal saline lavage (i.e., NeilMed) is recommended as needed and prior to medicated nasal sprays. - RTC if no improvement with above measures. Patient understands and agrees to plan.

## 2019-08-23 ENCOUNTER — Ambulatory Visit: Payer: Medicaid Other | Admitting: Family Medicine

## 2019-09-07 ENCOUNTER — Ambulatory Visit: Payer: Medicaid Other | Attending: Internal Medicine

## 2019-09-07 DIAGNOSIS — Z20822 Contact with and (suspected) exposure to covid-19: Secondary | ICD-10-CM

## 2019-09-08 LAB — NOVEL CORONAVIRUS, NAA: SARS-CoV-2, NAA: DETECTED — AB

## 2019-09-09 ENCOUNTER — Telehealth: Payer: Self-pay | Admitting: Adult Health

## 2019-09-09 ENCOUNTER — Telehealth: Payer: Self-pay | Admitting: Neurology

## 2019-09-09 NOTE — Telephone Encounter (Signed)
I called patient about her covid positivty  Date Tested: 09/07/2019  Date of Symptom onset: 09/02/2019   Symptoms: nausea, headache  She meets criteria for monoclonal therapy due to her multiple sclerosis, treatment with Ocrelizumab, and her BMI of 37  I reviewed monoclonal antibody treatment with her in detail.  After discussion she would like to talk it over with her husband and will call us back.  Patient voiced appreciation for call  Lillard Anes, NP

## 2019-09-09 NOTE — Telephone Encounter (Signed)
FYI-Pt called to inform us that she has tested positive for Covid.

## 2019-09-10 ENCOUNTER — Other Ambulatory Visit: Payer: Self-pay

## 2019-09-10 ENCOUNTER — Telehealth (INDEPENDENT_AMBULATORY_CARE_PROVIDER_SITE_OTHER): Payer: Medicaid Other | Admitting: Family Medicine

## 2019-09-10 DIAGNOSIS — U071 COVID-19: Secondary | ICD-10-CM

## 2019-09-10 NOTE — Assessment & Plan Note (Signed)
Positive 09/07/19, symptoms started Thursday 5 days prior.  No symptoms now beyond minor fatigue.  Advised to isolate until at least Monday plus 24hrs after resolution in symptoms per CDC guidelines.  Also advised to call oral surgeon and with dates of symptoms and testing to confirm surgery scheduling and their plan for abx (patient had been given clindamycin and was told covid pos is not a reason to stop abx)  Patient also got called and offered monoclonal antibody treatment because she is in a higher risk category.  We discussed that data is somewhat limited but that there is an indication that treatment for those in high risk categories may have a smaller risk of needing health system intervention with the treatment.  She is going to think about it.

## 2019-09-10 NOTE — Progress Notes (Signed)
[facility] Telemedicine Visit  Patient consented to have virtual visit. Method of visit: Telephone, video failed  Encounter participants: Patient: Alicia Barker - located at home Provider: Marthenia Rolling - located at home telemed Others (if applicable):   Chief Complaint: covid positive  HPI: Took test for 3 days of being tired, then tested positive on Jan 26th.    ROS: per HPI  Pertinent PMHx: MS, covid pos, ?some sort of oral infection in her jaw she is scheduled for surgery next wk with Dr. Gala Romney  Exam:  Respiratory: no distress visually before camera quit, speaking in calm full sentences, no cough, appropriately asking detailed questions about her care.  Assessment/Plan:  COVID-19 Positive 09/07/19, symptoms started Thursday 5 days prior.  No symptoms now beyond minor fatigue.  Advised to isolate until at least Monday plus 24hrs after resolution in symptoms per CDC guidelines.  Also advised to call oral surgeon and with dates of symptoms and testing to confirm surgery scheduling and their plan for abx (patient had been given clindamycin and was told covid pos is not a reason to stop abx)  Patient also got called and offered monoclonal antibody treatment because she is in a higher risk category.  We discussed that data is somewhat limited but that there is an indication that treatment for those in high risk categories may have a smaller risk of needing health system intervention with the treatment.  She is going to think about it.    Time spent during visit with patient: 15 minutes

## 2019-10-20 ENCOUNTER — Other Ambulatory Visit: Payer: Self-pay | Admitting: Neurology

## 2019-10-20 MED ORDER — AMPHETAMINE-DEXTROAMPHETAMINE 10 MG PO TABS: 10.0000 mg | ORAL_TABLET | Freq: Two times a day (BID) | ORAL | 0 refills | Status: DC

## 2019-10-20 NOTE — Telephone Encounter (Signed)
Pt has called for a refill on her  amphetamine-dextroamphetamine (ADDERALL) 10 MG table [facility] [phone]

## 2019-10-20 NOTE — Addendum Note (Signed)
Addended by: Arther Abbott on: 10/20/2019 01:19 PM   Modules accepted: Orders

## 2019-10-24 ENCOUNTER — Other Ambulatory Visit: Payer: Self-pay | Admitting: Neurology

## 2019-11-17 ENCOUNTER — Ambulatory Visit (INDEPENDENT_AMBULATORY_CARE_PROVIDER_SITE_OTHER)
Admission: RE | Admit: 2019-11-17 | Discharge: 2019-11-17 | Disposition: A | Discharge status: Home / Self Care | Payer: Medicaid Other | Source: Ambulatory Visit

## 2019-11-17 ENCOUNTER — Telehealth: Payer: Medicaid Other

## 2019-11-17 DIAGNOSIS — J069 Acute upper respiratory infection, unspecified: Secondary | ICD-10-CM

## 2019-11-17 MED ORDER — ALBUTEROL SULFATE HFA 108 (90 BASE) MCG/ACT IN AERS: 2.0000 | INHALATION_SPRAY | RESPIRATORY_TRACT | 0 refills | Status: DC | PRN

## 2019-11-17 MED ORDER — BENZONATATE 100 MG PO CAPS: 100.0000 mg | ORAL_CAPSULE | Freq: Three times a day (TID) | ORAL | 0 refills | Status: DC

## 2019-11-17 NOTE — ED Provider Notes (Signed)
Virtual Visit via Video Note:  Alicia Barker  initiated request for Telemedicine visit with [facility] team. I connected with Alicia Barker  on 11/17/2019 at 1:38 PM  for a synchronized telemedicine visit using a video enabled HIPPA compliant telemedicine application. I verified that I am speaking with Alicia Barker  using two identifiers. Mickie Bail, NP  was physically located in a [facility] Urgent care site and PORTER NAKAMA was located at a different location.   The limitations of evaluation and management by telemedicine as well as the availability of in-person appointments were discussed. Patient was informed that she  may incur a bill ( including co-pay) for this virtual visit encounter. Alicia Barker  expressed understanding and gave verbal consent to proceed with virtual visit.     History of Present Illness:Alicia Barker  is a 34 y.o. female presents for evaluation of chest congestion x 4 days.  She has a cough productive of yellow phlegm and feels mildly short of breath with exertion.  She denies fever, chills, sore throat, vomiting, diarrhea, rash, or other symptoms.  She tested negative for COVID on 11/13/2019; she was previous positive for COVID in January 2021.   She has been treating her symptoms at home with honey lemon tea.  She smokes 2-3 cigarettes per day x 6 years.     Allergies  Allergen Reactions  . Penicillins Anaphylaxis, Shortness Of Breath and Swelling    Has patient had a PCN reaction causing immediate rash, facial/tongue/throat swelling, SOB or lightheadedness with hypotension: YES Has patient had a PCN reaction causing severe rash involving mucus membranes or skin necrosis: NO Has patient had a PCN reaction that required hospitalization NO Has patient had a PCN reaction occurring within the last 10 years: NO If all of the above answers are "NO", then may proceed with Cephalosporin use.  Marland Kitchen Zoloft [Sertraline Hcl]     Jittery per pt      Past Medical History:  Diagnosis Date  . Depression    no meds currently  . MS (multiple sclerosis) (HCC)   . SVD (spontaneous vaginal delivery)    x 5     Social History   Tobacco Use  . Smoking status: Former Smoker    Packs/day: 0.10    Types: Cigarettes  . Smokeless tobacco: Never Used  Substance Use Topics  . Alcohol use: No    Alcohol/week: 0.0 standard drinks  . Drug use: No   ROS: as stated in HPI.  All other systems reviewed and negative.      Observations/Objective: Physical Exam  VITALS: Patient denies fever. GENERAL: Alert, appears well and in no acute distress. HEENT: Atraumatic. NECK: Normal movements of the head and neck. CARDIOPULMONARY: No increased WOB. Speaking in clear sentences. I:E ratio WNL.  MS: Moves all visible extremities without noticeable abnormality. PSYCH: Pleasant and cooperative, well-groomed. Speech normal rate and rhythm. Affect is appropriate. Insight and judgement are appropriate. Attention is focused, linear, and appropriate.  NEURO: CN grossly intact. Oriented as arrived to appointment on time with no prompting. Moves both UE equally.  SKIN: No obvious lesions, wounds, erythema, or cyanosis noted on face or hands.   Assessment and Plan:    ICD-10-CM   1. Upper respiratory tract infection, unspecified type  J06.9        Follow Up Instructions: Treating with albuterol inhaler and Tessalon Perles.  Instructed her to follow up with her primary care provider or  come here to be seen in person if her symptoms are not improving.  Patient agrees to plan of care.      I discussed the assessment and treatment plan with the patient. The patient was provided an opportunity to ask questions and all were answered. The patient agreed with the plan and demonstrated an understanding of the instructions.   The patient was advised to call back or seek an in-person evaluation if the symptoms worsen or if the condition fails to improve as  anticipated.      Sharion Balloon, NP  11/17/2019 1:38 PM         Sharion Balloon, NP 11/17/19 1338

## 2019-11-17 NOTE — Discharge Instructions (Addendum)
Use the albuterol inhaler and take the cough medication as directed.    Follow up with your primary care provider or come here to be seen in person if your symptoms are not improving.

## 2019-11-18 ENCOUNTER — Telehealth: Payer: Self-pay | Admitting: *Deleted

## 2019-11-18 NOTE — Telephone Encounter (Signed)
Called pt at [phone], LVM for her to call office. Tried calling pt at [phone]. LVM for pt to call office.  Please let her know if she calls that we received a fax from [employer] that they have been trying to reach her to discuss her ongoing eligibility for the [facility]. She needs to call them back at [phone] to update them and prevent any delay in her next Ocrevus infusion.

## 2019-11-26 ENCOUNTER — Other Ambulatory Visit: Payer: Self-pay | Admitting: Neurology

## 2019-11-29 ENCOUNTER — Encounter: Payer: Self-pay | Admitting: *Deleted

## 2019-11-29 NOTE — Telephone Encounter (Signed)
Send pt letter since unable to reach by phone/mychart

## 2019-12-07 ENCOUNTER — Other Ambulatory Visit: Payer: Self-pay

## 2019-12-07 ENCOUNTER — Institutional Professional Consult (permissible substitution): Payer: Medicaid Other | Admitting: Internal Medicine

## 2019-12-08 ENCOUNTER — Encounter: Payer: Self-pay | Admitting: Family Medicine

## 2019-12-08 ENCOUNTER — Encounter: Payer: Medicaid Other | Admitting: Family Medicine

## 2019-12-08 ENCOUNTER — Other Ambulatory Visit: Payer: Self-pay

## 2019-12-08 ENCOUNTER — Telehealth: Payer: Self-pay | Admitting: Family Medicine

## 2019-12-08 VITALS — BP 110/70 | HR 77 | Temp 97.2°F | Ht 66.0 in | Wt 216.0 lb

## 2019-12-08 NOTE — Progress Notes (Signed)
PATIENT: Alicia Barker DOB: [birth year]  REASON FOR VISIT: follow up HISTORY FROM: patient  Chief Complaint  Patient presents with  . Follow-up    rm 1, alone, MS      HISTORY OF PRESENT ILLNESS: Today 12/08/19 Alicia Barker is a 34 y.o. female here today for follow up.   HISTORY: (copied from Dr Bonnita Hollow note on 05/10/2019)  Alicia Barker is a 34 y.o. woman who was diagnosed with MS in March 2017.     Update 05/10/2019:  She is having headaches and visual changes.   Pain is sharp and started on the left and now is on the right as well.   She notes more visual changes OD.  Colors are washed out.   Acuity is poor.    She is on Ocrevus.  The last infusion was in April.   She did have an exacerbation with right optic neuritis in July and had another ON (left) last year.    She never had the brain MRI ordered in July (plan was to stay on Ocrevus if unchanged or switch to a different DMT if significant changes).    In July we did one gram IV Solumedrol followed by high dose prednisone taper.    She did take Prednisone 100 mg x 2 days without benefit (still had some pills left over).    Her gait is a little more off balanced.     Update 03/04/2019: About 2 weeks ago, she began to note more visual problems out of the right eye.   She is unable to read small print.  Colors are less vibrant.     Her last Ocrevus injection was April and she has tolerated them well.     She noted vertigo initially as well.     She still has some vertigo but it is better.  She does not note any change in strength and there is no new numbness.  Of note, in November 2019 she had an exacerbation with optic neuritis on the left.  Even being treated with plasmapheresis after steroids she did not recover significant vision.  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   She has been on Ocrevus for the past 2 years.  She has tolerated it fairly well but I discussed with  her my concern that she might be having a second exacerbation while on it and exacerbations are very rare without medication.  Of note, earlier this year I checked the CD19 and CD20 and she did have 2% B cells.  IgG/IgM has been good.  She denies any in triggering event.  No infections.  Update 10/22/2018: She was admitted to [facility] 07/10/2018 due to left optic neuritis.  She received IV Solu-Medrol but had no improvement and then had plasmapheresis.  Unfortunately the improvement has been mild.  Currently, she can see some shapes and hand waving out of the left eye but cannot count fingers.  There is no color vision on the left.  She also had more difficulty with her gait and some clumsiness in the left arm and leg around the same time of the.  She feels her gait has improved close to the baseline before her heart lesion.  She has some bladder urgency.  I reviewed the MRI of the brain from 07/10/2018.  It showed a couple new lesions compared to 2017 (before she was on Ocrevus) but these were not large enough to  cause her symptoms.  There was no new MRI of the spine.  She does note more difficulties with depression.  Earlier this year she has some suicidal ideation but did not have any suicidal plan.  She is on sertraline 100 mg daily.  We discussed seeing counseling.  Additionally, she has more difficulty with fatigue.  She sleeps okay most nights.  She has not noted any change in cognition.  Update 01/19/2018:   She feels her MS is stable.  She denies any exacerbations.  She is on Ocrevus.  She tolerates it well.  Her last infusion was April 2019.  At the last visit we checked IgG/IgM and they were normal.  She notes some difficulty with her gait and some mild weakness in her legs.  The weakness is worse on the right than the left.  When she walks a while she will have a right foot drop.  She also notes some right leg numbness and tingling.  She is able to walk without a cane.  Vision is  fine.  She notes less urinary urgency incontinence on oxybutynin and Myrbetriq  She has some fatigue.  She sleeps well most nights.  She has mild depression, somewhat helped by Celexa.   She never did counseling.   She has had some attention deficit helped by Adderall (does not take every day).     She has fewer headaches.  When one occurs, she takes Tyl #3   Update 07/14/2017: She had her last ocrelizumab infusion at the end of July 2018. The next infusion is scheduled for 09/11/2017. She has tolerated the infusions well and has not had any definite exacerbations. Since her large exacerbation in 2017, she continues to have weakness in her legs.   Her right leg feels weaker than her left leg.   She sometimes notes a right foot drop. She walks without a cane.    She is able to do some part time work.  She notes some right leg numbness and tingling.   She has some urinary incontinenc associated with urge.     Oxybutynin has helped a lot.     Mood is better recently but some days she feels hopeless.    She is still on an antidepressant and feels it helps.    From 03/12/2017 MS:  She switched from Tecfidera to Ocrevus in December 2017/January 2018 and just had her second infusion.    She has no new exacerbation.  She was started on Tecfidera April 2017.    She was tolerating Tecfidera well but in August had more difficulty with balance and slurred speech.    MRI 03/25/2016 showed several new enhancing lesions c/w active demyelination   She received IV steroids and started Ocrevus, first dose in December.  .     Gait/strength/sensation:   Since the large exacerbation in 2017, She reports reports left > right leg weakness and clumsiness.   Gait is unsteady.   She has fallen a few times.  She uses a cane sometimes but no longer needs a walker. The right leg was weaker before the large exacerbation.   No hand numbness.    The left leg has painful dysesthesias with a stabbing quality.     Gabapentin had not  helped in the past    Bladder/bowel: She has more urinary frequency and urgency with occasional urinary incontinence. Oxybutynin has not helped much.   She has rare bowel incontinence.     Vision/vertigo: She notes some visual  blurring since last year.   She has some diplopia but this is better.  Fatigue/sleep: She notes a lot of fatigue, physical > mental.   Sleep is worse with more trouble staying asleep.   Her insomnia improved with nighttime cyclobenzaprine earlier this year but now she has more again.  Mood/cognition: She is having some depression and anxiety that worsened last year and has persisted despite escitalopram.     She gets irritable easily and has a short fuse.     She also reports apathy and poor sleep.  Cognitive processing speed appears to be slower.  HA:   She has pain in the temples and the neck since Monday.   Pain worsens when she stands or moves and is best when she is still.   She denies N/V but has photophobia and phonophobia.   This is her worst HA in years but similar to ones she had many years ago.   Tylenol #3 and Ibuprofen have not helped.     MS History:   In early March, 2017, she had the onset of slurred speech and leg weakness and ataxia.    A head CT was reportedly normal.    Over the next couple weeks, she had right worse than left visual acuity issues, more numbness and more clumsiness.        Her gait became unsteady.     She also noted mid back pain.    She went to the ER and was diagnosed with an inner ear problem.   She went back to ER 11/15/15 and was found to have an abnormal MRI consistent with MS.    MRI of the spine also showed additional plaques.     She received 5 days of IV Steroids and noticed an improvement with improved gait but continued numbness.    I have reviewed the MRIs of the brain and spine performed for 12/30/2015 and 11/17/2015. The MRI of the brain with and without contrast shows multiple T2/FLAIR hyperintense foci, many in the  periventricular white matter.    Another focus is the pons (with mild enhancement, better seen on c-spine MRI).Marland Kitchen 7 foci enhanced after gadolinium administration imply more recent MS plaques.    There are at least 2 small T2 hyperintense foci within the cervical spine, one of which appears to enhance slightly.    Another enhancing focus is noted within the thoracic spine adjacent to T3-T4.   She started Tecfidera after I first saw her 11/28/2015.   She had a significant relapse and was switched over to ocrelizumab if her first dose around January or February 2018.         REVIEW OF SYSTEMS: Out of a complete 14 system review of symptoms, the patient complains only of the following symptoms, and all other reviewed systems are negative.  ALLERGIES: Allergies  Allergen Reactions  . Penicillins Anaphylaxis, Shortness Of Breath and Swelling    Has patient had a PCN reaction causing immediate rash, facial/tongue/throat swelling, SOB or lightheadedness with hypotension: YES Has patient had a PCN reaction causing severe rash involving mucus membranes or skin necrosis: NO Has patient had a PCN reaction that required hospitalization NO Has patient had a PCN reaction occurring within the last 10 years: NO If all of the above answers are "NO", then may proceed with Cephalosporin use.  Marland Kitchen Zoloft [Sertraline Hcl]     Jittery per pt    HOME MEDICATIONS: Outpatient Medications Prior to Visit  Medication Sig Dispense Refill  .  acetaminophen-codeine (TYLENOL #3) 300-30 MG tablet TAKE 1 TABLET BY MOUTH THREE TIMES DAILY 90 tablet 0  . albuterol (VENTOLIN HFA) 108 (90 Base) MCG/ACT inhaler Inhale 2 puffs into the lungs every 4 (four) hours as needed for wheezing or shortness of breath. 18 g 0  . amphetamine-dextroamphetamine (ADDERALL) 10 MG tablet Take 1 tablet (10 mg total) by mouth 2 (two) times daily. 60 tablet 0  . benzonatate (TESSALON) 100 MG capsule Take 1 capsule (100 mg total) by mouth every 8 (eight)  hours. 21 capsule 0  . cyclobenzaprine (FLEXERIL) 5 MG tablet TAKE 1 TABLET BY MOUTH EVERY NIGHT AT BEDTIME. TAKE UP TO THREE TIMES DAILY (Patient taking differently: Take 5 mg by mouth as needed for muscle spasms. ) 90 tablet 11  . fluticasone (FLONASE) 50 MCG/ACT nasal spray Place 2 sprays into both nostrils daily. 16 g 6  . ibuprofen (ADVIL,MOTRIN) 800 MG tablet TAKE 1 TABLET BY MOUTH AS NEEDED (Patient taking differently: Take 800 mg by mouth every 6 (six) hours as needed for mild pain. ) 30 tablet 0  . meclizine (ANTIVERT) 25 MG tablet Take 1 tablet (25 mg total) by mouth 3 (three) times daily as needed for dizziness. 30 tablet 0  . ocrelizumab 600 mg in sodium chloride 0.9 % 500 mL Inject 600 mg into the vein every 6 (six) months.    . ondansetron (ZOFRAN) 4 MG tablet Take 1 tablet (4 mg total) by mouth as needed for nausea or vomiting. 3 tablet 0  . predniSONE (DELTASONE) 50 MG tablet 10 po x 1 day (500 g) then 8 po x 1d then 6 po x 1d, then 4 po x 1d, then 2 po x1d, then 1 po x 1d, then 1/2 po x 1d 52 tablet 0   No facility-administered medications prior to visit.    PAST MEDICAL HISTORY: Past Medical History:  Diagnosis Date  . Depression    no meds currently  . MS (multiple sclerosis) (HCC)   . SVD (spontaneous vaginal delivery)    x 5    PAST SURGICAL HISTORY: Past Surgical History:  Procedure Laterality Date  . IR FLUORO GUIDE CV LINE RIGHT  07/16/2018  . IR REMOVAL TUN CV CATH W/O FL  08/04/2018  . IR US GUIDE VASC ACCESS RIGHT  07/16/2018  . LAPAROSCOPIC TUBAL LIGATION Bilateral 01/06/2013   Procedure: LAPAROSCOPIC TUBAL LIGATION;  Surgeon: Kathreen Cosier, MD;  Location: [facility];  Service: Gynecology;  Laterality: Bilateral;  . right hand surgery      FAMILY HISTORY: Family History  Problem Relation Age of Onset  . Diabetes Mother   . Heart disease Mother   . Diabetes Father   . Multiple sclerosis Cousin     SOCIAL HISTORY: Social History   Socioeconomic  History  . Marital status: Married    Spouse name: Not on file  . Number of children: Not on file  . Years of education: Not on file  . Highest education level: Not on file  Occupational History  . Not on file  Tobacco Use  . Smoking status: Former Smoker    Packs/day: 0.10    Types: Cigarettes  . Smokeless tobacco: Never Used  Substance and Sexual Activity  . Alcohol use: No    Alcohol/week: 0.0 standard drinks  . Drug use: No  . Sexual activity: Yes    Birth control/protection: None  Other Topics Concern  . Not on file  Social History Narrative  . Not on file  Social Determinants of Health   Financial Resource Strain:   . Difficulty of Paying Living Expenses:   Food Insecurity:   . Worried About Charity fundraiser in the Last Year:   . Arboriculturist in the Last Year:   Transportation Needs:   . Film/video editor (Medical):   Marland Kitchen Lack of Transportation (Non-Medical):   Physical Activity:   . Days of Exercise per Week:   . Minutes of Exercise per Session:   Stress:   . Feeling of Stress :   Social Connections:   . Frequency of Communication with Friends and Family:   . Frequency of Social Gatherings with Friends and Family:   . Attends Religious Services:   . Active Member of Clubs or Organizations:   . Attends Archivist Meetings:   Marland Kitchen Marital Status:   Intimate Partner Violence:   . Fear of Current or Ex-Partner:   . Emotionally Abused:   Marland Kitchen Physically Abused:   . Sexually Abused:       PHYSICAL EXAM  Vitals:   12/08/19 0743  BP: 110/70  Pulse: 77  Temp: (!) 97.2 F (36.2 C)  Weight: 216 lb (98 kg)  Height: 5\' 6"  (1.676 m)   Body mass index is 34.86 kg/m.  Generalized: Well developed, in no acute distress  Cardiology: normal rate and rhythm, no murmur noted Respiratory: clear to auscultation bilaterally  Neurological examination  Mentation: Alert oriented to time, place, history taking. Follows all commands speech and language  fluent Cranial nerve II-XII: Pupils were equal round reactive to light. Extraocular movements were full, visual field were full on confrontational test. Facial sensation and strength were normal. Uvula tongue midline. Head turning and shoulder shrug  were normal and symmetric. Motor: The motor testing reveals 5 over 5 strength of all 4 extremities. Good symmetric motor tone is noted throughout.  Sensory: Sensory testing is intact to soft touch on all 4 extremities. No evidence of extinction is noted.  Coordination: Cerebellar testing reveals good finger-nose-finger and heel-to-shin bilaterally.  Gait and station: Gait is normal. Tandem gait is normal. Romberg is negative. No drift is seen.  Reflexes: Deep tendon reflexes are symmetric and normal bilaterally.   DIAGNOSTIC DATA (LABS, IMAGING, TESTING) - I reviewed patient records, labs, notes, testing and imaging myself where available.  No flowsheet data found.   Lab Results  Component Value Date   WBC 5.3 05/04/2019   HGB 11.1 (L) 05/04/2019   HCT 33.5 (L) 05/04/2019   MCV 87.7 05/04/2019   PLT 370 05/04/2019      Component Value Date/Time   NA 138 05/04/2019 1900   K 3.4 (L) 05/04/2019 1900   CL 104 05/04/2019 1900   CO2 23 05/04/2019 1900   GLUCOSE 101 (H) 05/04/2019 1900   BUN 9 05/04/2019 1900   CREATININE 0.82 05/04/2019 1900   CALCIUM 9.1 05/04/2019 1900   PROT 6.3 (L) 05/04/2019 1900   PROT 6.5 04/16/2016 1119   ALBUMIN 3.8 05/04/2019 1900   ALBUMIN 3.9 04/16/2016 1119   AST 19 05/04/2019 1900   ALT 22 05/04/2019 1900   ALKPHOS 57 05/04/2019 1900   BILITOT 0.3 05/04/2019 1900   BILITOT <0.2 04/16/2016 1119   GFRNONAA >60 05/04/2019 1900   GFRAA >60 05/04/2019 1900   No results found for: CHOL, HDL, LDLCALC, LDLDIRECT, TRIG, CHOLHDL Lab Results  Component Value Date   HGBA1C 5.3 02/22/2016   No results found for: [id number] Lab Results  Component Value  Date   TSH 1.287 02/22/2016       ASSESSMENT AND  PLAN 34 y.o. year old female  has a past medical history of Depression, MS (multiple sclerosis) (HCC), and SVD (spontaneous vaginal delivery). here with   No diagnosis found.     No orders of the defined types were placed in this encounter.    No orders of the defined types were placed in this encounter.     I spent 15 minutes with the patient. 50% of this time was spent counseling and educating patient on plan of care and medications.    Shawnie Dapper, FNP-C 12/08/2019, 7:50 AM [facility] [address], [address] [place], [place] [address] [phone]

## 2019-12-08 NOTE — Telephone Encounter (Signed)
Ms,. Alicia Barker arrived to the office at 7:35 for her 7:30 appt. She was roomed and ready to be seen at 7:45. She was advised that I would see her as soon as possible as 8:00am appt was waiting. I arrived to room at 8:15 and patient had left. She was rescheduled for 01/04/2020 at 2pm. Asked to check in by 1:30 to prevent delays in visit.

## 2019-12-21 ENCOUNTER — Institutional Professional Consult (permissible substitution): Payer: Medicaid Other | Admitting: Internal Medicine

## 2019-12-22 ENCOUNTER — Other Ambulatory Visit: Payer: Self-pay | Admitting: Neurology

## 2019-12-22 MED ORDER — ACETAMINOPHEN-CODEINE #3 300-30 MG PO TABS: 1.0000 | ORAL_TABLET | Freq: Three times a day (TID) | ORAL | 0 refills | Status: DC

## 2019-12-22 MED ORDER — AMPHETAMINE-DEXTROAMPHETAMINE 10 MG PO TABS: 10.0000 mg | ORAL_TABLET | Freq: Two times a day (BID) | ORAL | 0 refills | Status: DC

## 2019-12-22 NOTE — Telephone Encounter (Signed)
Reviewed pt chart. She left without being seen by AL,NP 12/08/19 after being late to appt. R/s for 01/04/20. I checked drug registry. She last refilled Tylenol #3 11/04/19 #90 and adderall 10/21/19 #60. Last seen 05/10/2019. Will speak with MD to see if we can refill these for her until her appt.

## 2019-12-22 NOTE — Telephone Encounter (Signed)
Pt is needing a refill on her amphetamine-dextroamphetamine (ADDERALL) 10 MG tablet and her acetaminophen-codeine (TYLENOL #3) 300-30 MG tablet sent in to the [facility] on [address].

## 2019-12-23 ENCOUNTER — Other Ambulatory Visit: Payer: Self-pay | Admitting: *Deleted

## 2019-12-23 DIAGNOSIS — J302 Other seasonal allergic rhinitis: Secondary | ICD-10-CM

## 2019-12-23 MED ORDER — FLUTICASONE PROPIONATE 50 MCG/ACT NA SUSP: 2.0000 | Freq: Every day | NASAL | 6 refills | Status: AC

## 2019-12-29 ENCOUNTER — Telehealth: Payer: Self-pay | Admitting: Neurology

## 2019-12-29 NOTE — Telephone Encounter (Signed)
Pt called to inform she is needing a new letter for her eyesight and MS issues for her disability   Requested letter be sent to [employer]

## 2019-12-29 NOTE — Telephone Encounter (Signed)
Tried calling pt back, mailbox full. Sent SMS notification to pt.  Pt last seen 04/2019. Has f/u with AL,NP 01/04/20. This can be discussed at her upcoming visit as she has not been seen recently.

## 2020-01-04 ENCOUNTER — Other Ambulatory Visit: Payer: Self-pay

## 2020-01-04 ENCOUNTER — Encounter: Payer: Self-pay | Admitting: Family Medicine

## 2020-01-04 ENCOUNTER — Ambulatory Visit (INDEPENDENT_AMBULATORY_CARE_PROVIDER_SITE_OTHER): Payer: Medicaid Other | Admitting: Family Medicine

## 2020-01-04 VITALS — BP 114/73 | HR 71 | Ht 66.0 in | Wt 216.0 lb

## 2020-01-04 DIAGNOSIS — R5382 Chronic fatigue, unspecified: Secondary | ICD-10-CM

## 2020-01-04 DIAGNOSIS — F32A Depression, unspecified: Secondary | ICD-10-CM

## 2020-01-04 DIAGNOSIS — H539 Unspecified visual disturbance: Secondary | ICD-10-CM | POA: Diagnosis not present

## 2020-01-04 DIAGNOSIS — F329 Major depressive disorder, single episode, unspecified: Secondary | ICD-10-CM

## 2020-01-04 DIAGNOSIS — G35 Multiple sclerosis: Secondary | ICD-10-CM

## 2020-01-04 DIAGNOSIS — Z79899 Other long term (current) drug therapy: Secondary | ICD-10-CM | POA: Diagnosis not present

## 2020-01-04 MED ORDER — ESCITALOPRAM OXALATE 10 MG PO TABS: 10.0000 mg | ORAL_TABLET | Freq: Every day | ORAL | 11 refills | Status: DC

## 2020-01-04 NOTE — Progress Notes (Signed)
PATIENT: Alicia Barker DOB: 04-04-1986  REASON FOR VISIT: follow up HISTORY FROM: patient  Chief Complaint  Patient presents with  . Follow-up    rm 1 here for a f/u on MS. Pt is having no new sx     HISTORY OF PRESENT ILLNESS: Today 01/04/20 Alicia Barker is a 34 y.o. female here today for follow up for RRMS. She continues Ocrevus. She was last seen by Dr Epimenio Foot in 04/2019 for concerns of exacerbation as she was experiencing vision changes. MRI 05/2019 did not show any new lesions.  She reports that she is feeling well today.  Her next infusion is scheduled for Thursday, May 27.  She reports that the lady in treatment was partially related to her fear of coming out in public as well as some miscommunication with [place].  She denies any new or worsening symptoms.  Vision is fairly stable.  She does continue to have blurred vision of the left eye.  She is followed every 6 months by ophthalmology.  She does have urinary feels that this is unchanged.  No changes in her gait or new weakness or numbness.  Adderall 10mg  BID taken for ADD and MS fatigue. She typically takes it daily but will occasionally take a drug holiday on the weekends. She takes Tylenol #3 for chronic neck pain. She has sharp shooting pains in her neck. She also has a prescription for Flexeril to use as needed for neck tension.   She continues to have difficulty with depression and anxiety. She feels sad that she is not able to do things that she wants to do, physically. She worries all the time. She is not sleeping well. She stays awake worrying. Once she gets to sleep, she feels that she is restless and up and down all night. She has 6 children. She quit smoking last year. She denies SI/HI.    HISTORY: (copied from Dr note on 05/10/2019)  Alicia Barker is a 34 y.o. woman who was diagnosed with MS in March 2017.     Update 05/10/2019:  She is having headaches and visual changes.   Pain is sharp and  started on the left and now is on the right as well.   She notes more visual changes OD.  Colors are washed out.   Acuity is poor.    She is on Ocrevus.  The last infusion was in April.   She did have an exacerbation with right optic neuritis in July and had another ON (left) last year.    She never had the brain MRI ordered in July (plan was to stay on Ocrevus if unchanged or switch to a different DMT if significant changes).    In July we did one gram IV Solumedrol followed by high dose prednisone taper.    She did take Prednisone 100 mg x 2 days without benefit (still had some pills left over).    Her gait is a little more off balanced.     Update 03/04/2019: About 2 weeks ago, she began to note more visual problems out of the right eye.   She is unable to read small print.  Colors are less vibrant.     Her last Ocrevus injection was April and she has tolerated them well.     She noted vertigo initially as well.     She still has some vertigo but it is better.  She does not note any change in strength and there  is no new numbness.  Of note, in November 2019 she had an exacerbation with optic neuritis on the left.  Even being treated with plasmapheresis after steroids she did not recover significant vision.  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   She has been on Ocrevus for the past 2 years.  She has tolerated it fairly well but I discussed with her my concern that she might be having a second exacerbation while on it and exacerbations are very rare without medication.  Of note, earlier this year I checked the CD19 and CD20 and she did have 2% B cells.  IgG/IgM has been good.  She denies any in triggering event.  No infections.  Update 10/22/2018: She was admitted to [facility] 07/10/2018 due to left optic neuritis.  She received IV Solu-Medrol but had no improvement and then had plasmapheresis.  Unfortunately the improvement has been mild.   Currently, she can see some shapes and hand waving out of the left eye but cannot count fingers.  There is no color vision on the left.  She also had more difficulty with her gait and some clumsiness in the left arm and leg around the same time of the.  She feels her gait has improved close to the baseline before her heart lesion.  She has some bladder urgency.  I reviewed the MRI of the brain from 07/10/2018.  It showed a couple new lesions compared to 2017 (before she was on Ocrevus) but these were not large enough to cause her symptoms.  There was no new MRI of the spine.  She does note more difficulties with depression.  Earlier this year she has some suicidal ideation but did not have any suicidal plan.  She is on sertraline 100 mg daily.  We discussed seeing counseling.  Additionally, she has more difficulty with fatigue.  She sleeps okay most nights.  She has not noted any change in cognition.  Update 01/19/2018:   She feels her MS is stable.  She denies any exacerbations.  She is on Ocrevus.  She tolerates it well.  Her last infusion was April 2019.  At the last visit we checked IgG/IgM and they were normal.  She notes some difficulty with her gait and some mild weakness in her legs.  The weakness is worse on the right than the left.  When she walks a while she will have a right foot drop.  She also notes some right leg numbness and tingling.  She is able to walk without a cane.  Vision is fine.  She notes less urinary urgency incontinence on oxybutynin and Myrbetriq  She has some fatigue.  She sleeps well most nights.  She has mild depression, somewhat helped by Celexa.   She never did counseling.   She has had some attention deficit helped by Adderall (does not take every day).      She has fewer headaches.  When one occurs, she takes Tyl #3  Update 07/14/2017: She had her last ocrelizumab infusion at the end of July 2018. The next infusion is scheduled for 09/11/2017. She has tolerated the  infusions well and has not had any definite exacerbations. Since her large exacerbation in 2017, she continues to have weakness in her legs.   Her right leg feels weaker than her left leg.   She sometimes notes a right foot drop.    She walks without a cane.    She  is able to do some part time work.  She notes some right leg numbness and tingling.   She has some urinary incontinenc associated with urge.     Oxybutynin has helped a lot.     Mood is better recently but some days she feels hopeless.    She is still on an antidepressant and feels it helps.      From 03/12/2017 MS:  She switched from Tecfidera to [place] in December 2017/January 2018 and just had her second infusion.    She has no new exacerbation.  She was started on Tecfidera April 2017.    She was tolerating Tecfidera well but in August had more difficulty with balance and slurred speech.    MRI 03/25/2016 showed several new enhancing lesions c/w active demyelination   She received IV steroids and started Ocrevus, first dose in December.  .     Gait/strength/sensation:   Since the large exacerbation in 2017, She reports reports left > right leg weakness and clumsiness.   Gait is unsteady.   She has fallen a few times.  She uses a cane sometimes but no longer needs a walker. The right leg was weaker before the large exacerbation.   No hand numbness.    The left leg has painful dysesthesias with a stabbing quality.     Gabapentin had not helped in the past    Bladder/bowel: She has more urinary frequency and urgency with occasional urinary incontinence. Oxybutynin has not helped much.   She has rare bowel incontinence.     Vision/vertigo: She notes some visual blurring since last year.   She has some diplopia but this is better.  Fatigue/sleep: She notes a lot of fatigue, physical > mental.   Sleep is worse with more trouble staying asleep.   Her insomnia improved with nighttime cyclobenzaprine earlier this year but now she has more  again.  Mood/cognition: She is having some depression and anxiety that worsened last year and has persisted despite escitalopram.     She gets irritable easily and has a short fuse.     She also reports apathy and poor sleep.  Cognitive processing speed appears to be slower.  HA:   She has pain in the temples and the neck since Monday.   Pain worsens when she stands or moves and is best when she is still.   She denies N/V but has photophobia and phonophobia.   This is her worst HA in years but similar to ones she had many years ago.   Tylenol #3 and Ibuprofen have not helped.     MS History:   In early March, 2017, she had the onset of slurred speech and leg weakness and ataxia.    A head CT was reportedly normal.    Over the next couple weeks, she had right worse than left visual acuity issues, more numbness and more clumsiness.        Her gait became unsteady.     She also noted mid back pain.    She went to the ER and was diagnosed with an inner ear problem.   She went back to ER 11/15/15 and was found to have an abnormal MRI consistent with MS.    MRI of the spine also showed additional plaques.     She received 5 days of IV Steroids and noticed an improvement with improved gait but continued numbness.    I have reviewed the MRIs of the brain and spine  performed for 12/30/2015 and 11/17/2015. The MRI of the brain with and without contrast shows multiple T2/FLAIR hyperintense foci, many in the periventricular white matter.    Another focus is the pons (with mild enhancement, better seen on c-spine MRI).Marland Kitchen 7 foci enhanced after gadolinium administration imply more recent MS plaques.    There are at least 2 small T2 hyperintense foci within the cervical spine, one of which appears to enhance slightly.    Another enhancing focus is noted within the thoracic spine adjacent to T3-T4.   She started Tecfidera after I first saw her 11/28/2015.   She had a significant relapse and was switched over to ocrelizumab if  her first dose around January or February 2018.   REVIEW OF SYSTEMS: Out of a complete 14 system review of symptoms, the patient complains only of the following symptoms, chronic pain, blurred vision, anxiety, depression and all other reviewed systems are negative.  ALLERGIES: Allergies  Allergen Reactions  . Penicillins Anaphylaxis, Shortness Of Breath and Swelling    Has patient had a PCN reaction causing immediate rash, facial/tongue/throat swelling, SOB or lightheadedness with hypotension: YES Has patient had a PCN reaction causing severe rash involving mucus membranes or skin necrosis: NO Has patient had a PCN reaction that required hospitalization NO Has patient had a PCN reaction occurring within the last 10 years: NO If all of the above answers are "NO", then may proceed with Cephalosporin use.  Marland Kitchen Zoloft [Sertraline Hcl]     Jittery per pt    HOME MEDICATIONS: Outpatient Medications Prior to Visit  Medication Sig Dispense Refill  . acetaminophen-codeine (TYLENOL #3) 300-30 MG tablet Take 1 tablet by mouth 3 (three) times daily. 90 tablet 0  . albuterol (VENTOLIN HFA) 108 (90 Base) MCG/ACT inhaler Inhale 2 puffs into the lungs every 4 (four) hours as needed for wheezing or shortness of breath. 18 g 0  . amphetamine-dextroamphetamine (ADDERALL) 10 MG tablet Take 1 tablet (10 mg total) by mouth 2 (two) times daily. 60 tablet 0  . benzonatate (TESSALON) 100 MG capsule Take 1 capsule (100 mg total) by mouth every 8 (eight) hours. 21 capsule 0  . cyclobenzaprine (FLEXERIL) 5 MG tablet TAKE 1 TABLET BY MOUTH EVERY NIGHT AT BEDTIME. TAKE UP TO THREE TIMES DAILY (Patient taking differently: Take 5 mg by mouth as needed for muscle spasms. ) 90 tablet 11  . fluticasone (FLONASE) 50 MCG/ACT nasal spray Place 2 sprays into both nostrils daily. 16 g 6  . ibuprofen (ADVIL,MOTRIN) 800 MG tablet TAKE 1 TABLET BY MOUTH AS NEEDED (Patient taking differently: Take 800 mg by mouth every 6 (six) hours  as needed for mild pain. ) 30 tablet 0  . meclizine (ANTIVERT) 25 MG tablet Take 1 tablet (25 mg total) by mouth 3 (three) times daily as needed for dizziness. 30 tablet 0  . ocrelizumab 600 mg in sodium chloride 0.9 % 500 mL Inject 600 mg into the vein every 6 (six) months.    . ondansetron (ZOFRAN) 4 MG tablet Take 1 tablet (4 mg total) by mouth as needed for nausea or vomiting. 3 tablet 0  . predniSONE (DELTASONE) 50 MG tablet 10 po x 1 day (500 g) then 8 po x 1d then 6 po x 1d, then 4 po x 1d, then 2 po x1d, then 1 po x 1d, then 1/2 po x 1d 52 tablet 0   No facility-administered medications prior to visit.    PAST MEDICAL HISTORY: Past Medical History:  Diagnosis Date  . Depression    no meds currently  . MS (multiple sclerosis) (HCC)   . SVD (spontaneous vaginal delivery)    x 5    PAST SURGICAL HISTORY: Past Surgical History:  Procedure Laterality Date  . IR FLUORO GUIDE CV LINE RIGHT  07/16/2018  . IR REMOVAL TUN CV CATH W/O FL  08/04/2018  . IR US GUIDE VASC ACCESS RIGHT  07/16/2018  . LAPAROSCOPIC TUBAL LIGATION Bilateral 01/06/2013   Procedure: LAPAROSCOPIC TUBAL LIGATION;  Surgeon: Kathreen Cosier, MD;  Location: [facility];  Service: Gynecology;  Laterality: Bilateral;  . right hand surgery      FAMILY HISTORY: Family History  Problem Relation Age of Onset  . Diabetes Mother   . Heart disease Mother   . Diabetes Father   . Multiple sclerosis Cousin     SOCIAL HISTORY: Social History   Socioeconomic History  . Marital status: Married    Spouse name: Not on file  . Number of children: Not on file  . Years of education: Not on file  . Highest education level: Not on file  Occupational History  . Not on file  Tobacco Use  . Smoking status: Former Smoker    Packs/day: 0.10    Types: Cigarettes  . Smokeless tobacco: Never Used  Substance and Sexual Activity  . Alcohol use: No    Alcohol/week: 0.0 standard drinks  . Drug use: No  . Sexual activity: Yes     Birth control/protection: None  Other Topics Concern  . Not on file  Social History Narrative  . Not on file   Social Determinants of Health   Financial Resource Strain:   . Difficulty of Paying Living Expenses:   Food Insecurity:   . Worried About Programme researcher, broadcasting/film/video in the Last Year:   . Barista in the Last Year:   Transportation Needs:   . Freight forwarder (Medical):   Marland Kitchen Lack of Transportation (Non-Medical):   Physical Activity:   . Days of Exercise per Week:   . Minutes of Exercise per Session:   Stress:   . Feeling of Stress :   Social Connections:   . Frequency of Communication with Friends and Family:   . Frequency of Social Gatherings with Friends and Family:   . Attends Religious Services:   . Active Member of Clubs or Organizations:   . Attends Banker Meetings:   Marland Kitchen Marital Status:   Intimate Partner Violence:   . Fear of Current or Ex-Partner:   . Emotionally Abused:   Marland Kitchen Physically Abused:   . Sexually Abused:       PHYSICAL EXAM  Vitals:   01/04/20 1334  BP: 114/73  Pulse: 71  Weight: 216 lb (98 kg)  Height: 5\' 6"  (1.676 m)   Body mass index is 34.86 kg/m.  Generalized: Well developed, in no acute distress  Cardiology: normal rate and rhythm, no murmur noted Respiratory: clear to auscultation bilaterally  Neurological examination  Mentation: Alert oriented to time, place, history taking. Follows all commands speech and language fluent Cranial nerve II-XII: Pupils were equal round reactive to light. Extraocular movements were full, visual field were full on confrontational test. Facial sensation and strength were normal. Uvula tongue midline. Head turning and shoulder shrug  were normal and symmetric. Motor: The motor testing reveals 5 over 5 strength of all 4 extremities. Good symmetric motor tone is noted throughout.  Sensory: Sensory testing is intact  to soft touch on all 4 extremities. No evidence of extinction is  noted.  Coordination: Cerebellar testing reveals good finger-nose-finger and heel-to-shin bilaterally.  Gait and station: Gait is normal.    DIAGNOSTIC DATA (LABS, IMAGING, TESTING) - I reviewed patient records, labs, notes, testing and imaging myself where available.  No flowsheet data found.   Lab Results  Component Value Date   WBC 5.3 05/04/2019   HGB 11.1 (L) 05/04/2019   HCT 33.5 (L) 05/04/2019   MCV 87.7 05/04/2019   PLT 370 05/04/2019      Component Value Date/Time   NA 138 05/04/2019 1900   K 3.4 (L) 05/04/2019 1900   CL 104 05/04/2019 1900   CO2 23 05/04/2019 1900   GLUCOSE 101 (H) 05/04/2019 1900   BUN 9 05/04/2019 1900   CREATININE 0.82 05/04/2019 1900   CALCIUM 9.1 05/04/2019 1900   PROT 6.3 (L) 05/04/2019 1900   PROT 6.5 04/16/2016 1119   ALBUMIN 3.8 05/04/2019 1900   ALBUMIN 3.9 04/16/2016 1119   AST 19 05/04/2019 1900   ALT 22 05/04/2019 1900   ALKPHOS 57 05/04/2019 1900   BILITOT 0.3 05/04/2019 1900   BILITOT <0.2 04/16/2016 1119   GFRNONAA >60 05/04/2019 1900   GFRAA >60 05/04/2019 1900   No results found for: CHOL, HDL, LDLCALC, LDLDIRECT, TRIG, CHOLHDL Lab Results  Component Value Date   HGBA1C 5.3 02/22/2016   No results found for: VITAMINB12 Lab Results  Component Value Date   TSH 1.287 02/22/2016     ASSESSMENT AND PLAN 34 y.o. year old female  has a past medical history of Depression, MS (multiple sclerosis) (HCC), and SVD (spontaneous vaginal delivery). here with     ICD-10-CM   1. Relapsing remitting multiple sclerosis (HCC)  G35 CBC with Differential/Platelets    CMP  2. High risk medication use  Z79.899   3. Visual disturbance  H53.9   4. Depression, unspecified depression type  F32.9   5. Chronic fatigue  R53.82     Mrs Minchew is doing fairly well today.  She continues Ocrevus infusions.  Last infusion was in September.  She is scheduled for her next infusion in 2 days.  I have educated her on the importance of continuing  infusions every 6 months.  We will continue Adderall for MS-related fatigue and ADD.  She will continue Flexeril and Tylenol 3 as needed for neck pain.  We have discussed her concerns of depression and anxiety.  She has not tolerated sertraline in the past due to jitteriness.  We will try Escitalopram 10 mg daily.  She was educated on this medication regarding potential side effects instructed to reach out to me with any new or worsening symptoms.  We have discussed possibility of referring her to psychiatry if needed in the future.  Fortunately, she denies suicidal ideations.  She will contact me with any concerns.  We will update labs today.   She was encouraged to work on healthy lifestyle habits with well-balanced diet regular exercise.  Adequate hydration discussed. She will return in 6 months, sooner if needed.  She verbalizes understanding and agreement with this plan.   Orders Placed This Encounter  Procedures  . CBC with Differential/Platelets  . CMP     Meds ordered this encounter  Medications  . escitalopram (LEXAPRO) 10 MG tablet    Sig: Take 1 tablet (10 mg total) by mouth daily.    Dispense:  30 tablet    Refill:  11  Order Specific Question:   Supervising Provider    Answer:   Anson Fret [[id number]]      I spent 40 minutes with the patient. 50% of this time was spent counseling and educating patient on plan of care and medications.    Shawnie Dapper, FNP-C 01/04/2020, 2:30 PM [facility] [address] [place], [place] [address] [phone]

## 2020-01-04 NOTE — Patient Instructions (Addendum)
We will continue current treatment plan.   We will start escitalopram 10mg  daily. Please call with any worsening pr new symptoms after starting medication. Continue close follow up with PCP. We may consider psychiatry referral if needed.   Follow up with Dr in 6 months   Multiple Sclerosis Multiple sclerosis (MS) is a disease of the brain, spinal cord, and optic nerves (central nervous system). It causes the body's disease-fighting (immune) system to destroy the protective covering (myelin sheath) around nerves in the brain. When this happens, signals (nerve impulses) going to and from the brain and spinal cord do not get sent properly or may not get sent at all. There are several types of MS:  Relapsing-remitting MS. This is the most common type. This causes sudden attacks of symptoms. After an attack, you may recover completely until the next attack, or some symptoms may remain permanently.  Secondary progressive MS. This usually develops after the onset of relapsing-remitting MS. Similar to relapsing-remitting MS, this type also causes sudden attacks of symptoms. Attacks may be less frequent, but symptoms slowly get worse (progress) over time.  Primary progressive MS. This causes symptoms that steadily progress over time. This type of MS does not cause sudden attacks of symptoms. The age of onset of MS varies, but it often develops between 61-55 years of age. MS is a lifelong (chronic) condition. There is no cure, but treatment can help slow down the progression of the disease. What are the causes? The cause of this condition is not known. What increases the risk? You are more likely to develop this condition if:  You are a woman.  You have a relative with MS. However, the condition is not passed from parent to child (inherited).  You have a lack (deficiency) of vitamin D.  You smoke. MS is more common in the 41 than in the [place]. What are  the signs or symptoms? Relapsing-remitting and secondary progressive MS cause symptoms to occur in episodes or attacks that may last weeks to months. There may be long periods between attacks in which there are almost no symptoms. Primary progressive MS causes symptoms to steadily progress after they develop. Symptoms of MS vary because of the many different ways it affects the central nervous system. The main symptoms include:  Vision problems and eye pain.  Numbness.  Weakness.  Inability to move your arms, hands, feet, or legs (paralysis).  Balance problems.  Shaking that you cannot control (tremors).  Muscle spasms.  Problems with thinking (cognitive changes). MS can also cause symptoms that are associated with the disease, but are not always the direct result of an MS attack. They may include:  Inability to control urination or bowel movements (incontinence).  Headaches.  Fatigue.  Inability to tolerate heat.  Emotional changes.  Depression.  Pain. How is this diagnosed? This condition is diagnosed based on:  Your symptoms.  A neurological exam. This involves checking central nervous system function, such as nerve function, reflexes, and coordination.  MRIs of the brain and spinal cord.  Lab tests, including a lumbar puncture that tests the fluid that surrounds the brain and spinal cord (cerebrospinal fluid).  Tests to measure the electrical activity of the brain in response to stimulation (evoked potentials). How is this treated? There is no cure for MS, but medicines can help decrease the number and frequency of attacks and help relieve nuisance symptoms. Treatment options may include:  Medicines that reduce the frequency of attacks.  These medicines may be given by injection, by mouth (orally), or through an IV.  Medicines that reduce inflammation (steroids). These may provide short-term relief of symptoms.  Medicines to help control pain, depression,  fatigue, or incontinence.  Vitamin D, if you have a deficiency.  Using devices to help you move around (assistive devices), such as braces, a cane, or a walker.  Physical therapy to strengthen and stretch your muscles.  Occupational therapy to help you with everyday tasks.  Alternative or complementary treatments such as exercise, massage, or acupuncture. Follow these instructions at home:  Take over-the-counter and prescription medicines only as told by your health care provider.  Do not drive or use heavy machinery while taking prescription pain medicine.  Use assistive devices as recommended by your physical therapist or your health care provider.  Exercise as directed by your health care provider.  Return to your normal activities as told by your health care provider. Ask your health care provider what activities are safe for you.  Reach out for support. Share your feelings with friends, family, or a support group.  Keep all follow-up visits as told by your health care provider and therapists. This is important. Where to find more information  National Multiple Sclerosis Society: https://www.nationalmssociety.org Contact a health care provider if:  You feel depressed.  You develop new pain or numbness.  You have tremors.  You have problems with sexual function. Get help right away if:  You develop paralysis.  You develop numbness.  You have problems with your bladder or bowel function.  You develop double vision.  You lose vision in one or both eyes.  You develop suicidal thoughts.  You develop severe confusion. If you ever feel like you may hurt yourself or others, or have thoughts about taking your own life, get help right away. You can go to your nearest emergency department or call:  Your local emergency services (911 in the [place]  A suicide crisis helpline, such as the [facility] at 906-553-3456. This is open 24 hours a  day. Summary  Multiple sclerosis (MS) is a disease of the central nervous system that causes the body's immune system to destroy the protective covering (myelin sheath) around nerves in the brain.  There are 3 types of MS: relapsing-remitting, secondary progressive, and primary progressive. Relapsing-remitting and secondary progressive MS cause symptoms to occur in episodes or attacks that may last weeks to months. Primary progressive MS causes symptoms to steadily progress after they develop.  There is no cure for MS, but medicines can help decrease the number and frequency of attacks and help relieve nuisance symptoms. Treatment may also include physical or occupational therapy.  If you develop numbness, paralysis, vision problems, or other neurological symptoms, get help right away. This information is not intended to replace advice given to you by your health care provider. Make sure you discuss any questions you have with your health care provider. Document Revised: 07/11/2017 Document Reviewed: 10/07/2016 Elsevier Patient Education  2020 [employer].

## 2020-01-04 NOTE — Progress Notes (Signed)
I have read the note, and I agree with the clinical assessment and plan.  Jalaya Sarver A. Clavin Ruhlman, MD, PhD, FAAN Certified in Neurology, Clinical Neurophysiology, Sleep Medicine, Pain Medicine and Neuroimaging  [facility] [address], [address] [place], [place] [address] [phone]  

## 2020-01-05 LAB — CBC WITH DIFFERENTIAL/PLATELET
Basophils Absolute: 0 10*3/uL (ref 0.0–0.2)
Basos: 1 %
EOS (ABSOLUTE): 0.5 10*3/uL — ABNORMAL HIGH (ref 0.0–0.4)
Eos: 8 %
Hematocrit: 34 % (ref 34.0–46.6)
Hemoglobin: 11.1 g/dL (ref 11.1–15.9)
Immature Grans (Abs): 0 10*3/uL (ref 0.0–0.1)
Immature Granulocytes: 1 %
Lymphocytes Absolute: 1.5 10*3/uL (ref 0.7–3.1)
Lymphs: 25 %
MCH: 28.8 pg (ref 26.6–33.0)
MCHC: 32.6 g/dL (ref 31.5–35.7)
MCV: 88 fL (ref 79–97)
Monocytes Absolute: 0.6 10*3/uL (ref 0.1–0.9)
Monocytes: 9 %
Neutrophils Absolute: 3.4 10*3/uL (ref 1.4–7.0)
Neutrophils: 56 %
Platelets: 283 10*3/uL (ref 150–450)
RBC: 3.85 x10E6/uL (ref 3.77–5.28)
RDW: 14.1 % (ref 11.7–15.4)
WBC: 6 10*3/uL (ref 3.4–10.8)

## 2020-01-05 LAB — COMPREHENSIVE METABOLIC PANEL
ALT: 12 IU/L (ref 0–32)
AST: 14 IU/L (ref 0–40)
Albumin/Globulin Ratio: 2.3 — ABNORMAL HIGH (ref 1.2–2.2)
Albumin: 4.3 g/dL (ref 3.8–4.8)
Alkaline Phosphatase: 65 IU/L (ref 48–121)
BUN/Creatinine Ratio: 11 (ref 9–23)
BUN: 9 mg/dL (ref 6–20)
Bilirubin Total: <0.2 mg/dL (ref 0.0–1.2)
CO2: 23 mmol/L (ref 20–29)
Calcium: 9.7 mg/dL (ref 8.7–10.2)
Chloride: 106 mmol/L (ref 96–106)
Creatinine, Ser: 0.79 mg/dL (ref 0.57–1.00)
GFR calc Af Amer: 113 mL/min/{1.73_m2} (ref >59)
GFR calc non Af Amer: 98 mL/min/{1.73_m2} (ref >59)
Globulin, Total: 1.9 g/dL (ref 1.5–4.5)
Glucose: 76 mg/dL (ref 65–99)
Potassium: 4.5 mmol/L (ref 3.5–5.2)
Sodium: 141 mmol/L (ref 134–144)
Total Protein: 6.2 g/dL (ref 6.0–8.5)

## 2020-01-06 ENCOUNTER — Telehealth: Payer: Self-pay

## 2020-01-06 NOTE — Telephone Encounter (Signed)
Pt verified by name and DOB, results given per provider, pt voiced understanding all question answered. 

## 2020-02-07 ENCOUNTER — Other Ambulatory Visit: Payer: Self-pay

## 2020-02-07 ENCOUNTER — Ambulatory Visit (HOSPITAL_COMMUNITY)
Admission: EM | Admit: 2020-02-07 | Discharge: 2020-02-07 | Disposition: A | Discharge status: Home / Self Care | Payer: Medicaid Other

## 2020-02-07 DIAGNOSIS — T162XXA Foreign body in left ear, initial encounter: Secondary | ICD-10-CM | POA: Diagnosis not present

## 2020-02-07 NOTE — ED Provider Notes (Signed)
[facility]    CSN: [id number] Arrival date & time: 02/07/20  0802      History   Chief Complaint Chief Complaint  Patient presents with  . Foreign Body in Ear    HPI Alicia Barker is a 34 y.o. female.   The history is provided by the patient. No language interpreter was used.  Foreign Body in Ear This is a new problem. The problem occurs constantly. The problem has been rapidly worsening. Nothing aggravates the symptoms. She has tried nothing for the symptoms. The treatment provided no relief.  pT COMPLAINS OF A BUG IN HER EAR  Past Medical History:  Diagnosis Date  . Depression    no meds currently  . MS (multiple sclerosis) (HCC)   . SVD (spontaneous vaginal delivery)    x 5    Patient Active Problem List   Diagnosis Date Noted  . COVID-19 09/10/2019  . Seasonal allergies 06/13/2019  . Shortness of breath on exertion 06/13/2019  . 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 Attention deficit disorder   . Class 1 obesity due to excess calories without serious comorbidity with body mass index (BMI) of 30.0 to 30.9 in adult   . Depression 03/28/2016  . 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 Relapsing remitting multiple sclerosis (HCC) 11/15/2015    Past Surgical History:  Procedure Laterality Date  . IR FLUORO GUIDE CV LINE RIGHT  07/16/2018  . IR REMOVAL TUN CV CATH W/O FL  08/04/2018  . IR US GUIDE VASC ACCESS RIGHT  07/16/2018  . LAPAROSCOPIC TUBAL LIGATION Bilateral 01/06/2013   Procedure: LAPAROSCOPIC TUBAL LIGATION;  Surgeon: Kathreen Cosier, MD;  Location: [facility];  Service: Gynecology;  Laterality: Bilateral;  . right hand surgery      OB History    Gravida  5   Para  5   Term  4   Preterm  1   AB  0   Living  5     SAB  0   TAB  0   Ectopic  0   Multiple  0   Live Births  5            Home Medications    Prior to Admission medications   Medication Sig Start Date End Date Taking?  Authorizing Provider  acetaminophen-codeine (TYLENOL #3) 300-30 MG tablet Take 1 tablet by mouth 3 (three) times daily. 12/22/19  Yes Sater, Pearletha Furl, MD  albuterol (VENTOLIN HFA) 108 (90 Base) MCG/ACT inhaler Inhale 2 puffs into the lungs every 4 (four) hours as needed for wheezing or shortness of breath. 11/17/19  Yes Mickie Bail, NP  amphetamine-dextroamphetamine (ADDERALL) 10 MG tablet Take 1 tablet (10 mg total) by mouth 2 (two) times daily. 12/22/19  Yes Sater, Pearletha Furl, MD  fluticasone (FLONASE) 50 MCG/ACT nasal spray Place 2 sprays into both nostrils daily. 12/23/19  Yes Lockamy, Timothy, DO  benzonatate (TESSALON) 100 MG capsule Take 1 capsule (100 mg total) by mouth every 8 (eight) hours. 11/17/19   Mickie Bail, NP  cyclobenzaprine (FLEXERIL) 5 MG tablet TAKE 1 TABLET BY MOUTH EVERY NIGHT AT BEDTIME. TAKE UP TO THREE TIMES DAILY Patient taking differently: Take 5 mg by mouth as needed for muscle spasms.  03/20/18   Sater, Pearletha Furl, MD  escitalopram (LEXAPRO) 10 MG tablet Take 1 tablet (10 mg total) by mouth daily. 01/04/20   Lomax, Amy, NP  ibuprofen (ADVIL,MOTRIN) 800 MG tablet  TAKE 1 TABLET BY MOUTH AS NEEDED Patient taking differently: Take 800 mg by mouth every 6 (six) hours as needed for mild pain.  08/29/18   Wendee Beavers, DO  meclizine (ANTIVERT) 25 MG tablet Take 1 tablet (25 mg total) by mouth 3 (three) times daily as needed for dizziness. 03/01/16   Sater, Pearletha Furl, MD  ocrelizumab 600 mg in sodium chloride 0.9 % 500 mL Inject 600 mg into the vein every 6 (six) months.    [provider]  ondansetron (ZOFRAN) 4 MG tablet Take 1 tablet (4 mg total) by mouth as needed for nausea or vomiting. 03/28/18   Wurst, [place], PA-C    Family History Family History  Problem Relation Age of Onset  . Diabetes Mother   . Heart disease Mother   . Diabetes Father   . Multiple sclerosis Cousin     Social History Social History   Tobacco Use  . Smoking status: Former Smoker     Packs/day: 0.10    Types: Cigarettes  . Smokeless tobacco: Never Used  Substance Use Topics  . Alcohol use: No    Alcohol/week: 0.0 standard drinks  . Drug use: No     Allergies   Penicillins and Zoloft [sertraline hcl]   Review of Systems Review of Systems  All other systems reviewed and are negative.    Physical Exam Triage Vital Signs ED Triage Vitals  Enc Vitals Group     BP 02/07/20 0823 103/63     Pulse Rate 02/07/20 0823 90     Resp 02/07/20 0823 16     Temp 02/07/20 0823 98.2 F (36.8 C)     Temp Source 02/07/20 0823 Oral     SpO2 02/07/20 0823 99 %     Weight --      Height --      Head Circumference --      Peak Flow --      Pain Score 02/07/20 0824 6     Pain Loc --      Pain Edu? --      Excl. in GC? --    No data found.  Updated Vital Signs BP 103/63 (BP Location: Left Arm)   Pulse 90   Temp 98.2 F (36.8 C) (Oral)   Resp 16   LMP 01/30/2020   SpO2 99%   Visual Acuity Right Eye Distance:   Left Eye Distance:   Bilateral Distance:    Right Eye Near:   Left Eye Near:    Bilateral Near:     Physical Exam Vitals and nursing note reviewed.  Constitutional:      Appearance: She is well-developed.  HENT:     Head: Normocephalic.     Ears:     Comments: Bug in left ear  Pulmonary:     Effort: Pulmonary effort is normal.  Abdominal:     General: There is distension.  Musculoskeletal:        General: Normal range of motion.     Cervical back: Normal range of motion.  Neurological:     Mental Status: She is alert and oriented to person, place, and time.  Psychiatric:        Mood and Affect: Mood normal.      UC Treatments / Results  Labs (all labs ordered are listed, but only abnormal results are displayed) Labs Reviewed - No data to display  EKG   Radiology No results found.  Procedures Procedures (including critical care  time)  Medications Ordered in UC Medications - No data to display  Initial Impression /  Assessment and Plan / UC Course  I have reviewed the triage vital signs and the nursing notes.  Pertinent labs & imaging results that were available during my care of the patient were reviewed by me and considered in my medical decision making (see chart for details).     BUG removed by emt by irigatting Final Clinical Impressions(s) / UC Diagnoses   Final diagnoses:  Foreign body of left ear, initial encounter   Discharge Instructions   None    ED Prescriptions    None     PDMP not reviewed this encounter.   Elson Areas, [place] 02/07/20 [phone]

## 2020-02-07 NOTE — ED Triage Notes (Signed)
Pt reports that a bug crawled into her left ear while she was sleeping at a hotel last night. Pt states she put coconut oil in her ear, but bug remains.  Denies n/v, dizziness.

## 2020-03-09 ENCOUNTER — Telehealth: Payer: Self-pay | Admitting: Family Medicine

## 2020-03-09 NOTE — Telephone Encounter (Signed)
Pt called wanting to know if she is able to get her Covid Vaccine. Please advise.

## 2020-03-09 NOTE — Telephone Encounter (Signed)
Yes okay to get the vaccination on Ocrevus.  The best time to get the vaccination is about 4 months from her last Ocrevus infusion (and then she will get the second dose about 3 weeks later)

## 2020-03-09 NOTE — Telephone Encounter (Signed)
She is on ocrevus

## 2020-03-13 NOTE — Telephone Encounter (Signed)
Spoke to pt and relayed Dr. Bonnita Hollow recommendation about getting the COVD vaccine 4 months from last infusion.  She believes had last infusion last month, but not sure.  I will see with liane, RN in intrafusion.  (relayed then to get second vaccine 3 wks later).  She verbalized understanding.

## 2020-03-14 NOTE — Telephone Encounter (Signed)
Spoke with Freddi Starr, RN pts last dose 01-06-20.  Ok to get vaccine 05-08-20 (4 months from last infusion).  Pt informed. She verbalized understanding.

## 2020-05-10 DIAGNOSIS — Z20822 Contact with and (suspected) exposure to covid-19: Secondary | ICD-10-CM | POA: Diagnosis not present

## 2020-05-12 ENCOUNTER — Other Ambulatory Visit: Payer: Self-pay

## 2020-05-12 ENCOUNTER — Emergency Department (HOSPITAL_COMMUNITY): Payer: Medicaid Other

## 2020-05-12 ENCOUNTER — Emergency Department (HOSPITAL_COMMUNITY)
Admission: EM | Admit: 2020-05-12 | Discharge: 2020-05-13 | Discharge status: Against Medical Advice | Payer: Medicaid Other | Attending: Emergency Medicine | Admitting: Emergency Medicine

## 2020-05-13 NOTE — ED Notes (Signed)
P 

## 2020-05-15 ENCOUNTER — Telehealth: Payer: Self-pay | Admitting: *Deleted

## 2020-05-15 NOTE — Telephone Encounter (Signed)
Alicia Barker presented to the ED and left before being seen by the provider on 05/12/20. The patient has been enrolled in an automated general discharge outreach program and 2 attempts to contact the patient will be made to follow up on their ED visit and subsequent needs. The care management team is available to provide assistance to this patient at any time.   Jaynee Eagles, CMA [place]) [place]   [facility] Man[facility]ist

## 2020-05-25 DIAGNOSIS — Z0289 Encounter for other administrative examinations: Secondary | ICD-10-CM

## 2020-06-13 DIAGNOSIS — H468 Other optic neuritis: Secondary | ICD-10-CM | POA: Diagnosis not present

## 2020-06-13 DIAGNOSIS — H538 Other visual disturbances: Secondary | ICD-10-CM | POA: Diagnosis not present

## 2020-06-13 DIAGNOSIS — H5450 Low vision, one eye, unspecified eye: Secondary | ICD-10-CM | POA: Diagnosis not present

## 2020-06-13 DIAGNOSIS — G35 Multiple sclerosis: Secondary | ICD-10-CM | POA: Diagnosis not present

## 2020-06-14 DIAGNOSIS — H5213 Myopia, bilateral: Secondary | ICD-10-CM | POA: Diagnosis not present

## 2020-06-29 ENCOUNTER — Other Ambulatory Visit: Payer: Self-pay | Admitting: *Deleted

## 2020-06-29 DIAGNOSIS — G35 Multiple sclerosis: Secondary | ICD-10-CM

## 2020-06-29 MED ORDER — OCREVUS 300 MG/10ML IV SOLN: 600.0000 mg | INTRAVENOUS | 1 refills | Status: DC

## 2020-07-20 ENCOUNTER — Ambulatory Visit: Payer: Medicaid Other | Admitting: Neurology

## 2020-08-02 DIAGNOSIS — H5203 Hypermetropia, bilateral: Secondary | ICD-10-CM | POA: Diagnosis not present

## 2020-08-02 DIAGNOSIS — H52221 Regular astigmatism, right eye: Secondary | ICD-10-CM | POA: Diagnosis not present

## 2020-09-19 ENCOUNTER — Telehealth: Payer: Self-pay

## 2020-09-19 NOTE — Telephone Encounter (Signed)
I spoke to [place], Charity fundraiser in infusion suite.  Patient rescheduled her Ocrevus infusion in December 2021.  She is due for her appointment but has not called them back to reschedule this.  Her drug is available at the clinic  I attempted to call patient at both numbers listed on her chart.  Both phone numbers were unavailable and not in service.  I will send her a MyChart message asking her to call the infusion suite to schedule her Ocrevus infusion.

## 2020-10-09 ENCOUNTER — Other Ambulatory Visit (HOSPITAL_COMMUNITY)
Admission: RE | Admit: 2020-10-09 | Discharge: 2020-10-09 | Disposition: A | Discharge status: Home / Self Care | Payer: Medicaid Other | Source: Ambulatory Visit | Attending: Family Medicine | Admitting: Family Medicine

## 2020-10-09 ENCOUNTER — Ambulatory Visit (INDEPENDENT_AMBULATORY_CARE_PROVIDER_SITE_OTHER): Payer: Medicaid Other | Admitting: Family Medicine

## 2020-10-09 ENCOUNTER — Other Ambulatory Visit: Payer: Self-pay

## 2020-10-09 ENCOUNTER — Encounter: Payer: Self-pay | Admitting: Family Medicine

## 2020-10-09 VITALS — BP 132/65 | HR 98 | Ht 66.0 in | Wt 206.2 lb

## 2020-10-09 DIAGNOSIS — Z23 Encounter for immunization: Secondary | ICD-10-CM | POA: Diagnosis not present

## 2020-10-09 DIAGNOSIS — R300 Dysuria: Secondary | ICD-10-CM | POA: Diagnosis not present

## 2020-10-09 DIAGNOSIS — R87612 Low grade squamous intraepithelial lesion on cytologic smear of cervix (LGSIL): Secondary | ICD-10-CM | POA: Diagnosis not present

## 2020-10-09 LAB — POCT URINALYSIS DIP (MANUAL ENTRY)
Bilirubin, UA: NEGATIVE
Glucose, UA: NEGATIVE mg/dL
Nitrite, UA: NEGATIVE
Protein Ur, POC: 30 mg/dL — AB
Spec Grav, UA: >=1.03 — AB (ref 1.010–1.025)
Urobilinogen, UA: 2 E.U./dL — AB
pH, UA: 6 (ref 5.0–8.0)

## 2020-10-09 MED ORDER — CEPHALEXIN 500 MG PO CAPS: 500.0000 mg | ORAL_CAPSULE | Freq: Four times a day (QID) | ORAL | 0 refills | Status: DC

## 2020-10-09 NOTE — Assessment & Plan Note (Signed)
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 -Pap smear repeated at today's visit -We will follow-up with results and treat as needed

## 2020-10-09 NOTE — Patient Instructions (Addendum)
Thank you for coming in to see Alicia Barker today! Please see below to review our plan for today's visit:  1.  Your symptoms and lab work are concerning for urinary tract infection.  We will treat you with Keflex 500 mg 4 times daily for the next 5 days. 2.  Should you develop fevers, body aches, chills, or worsening urinary symptoms I recommend you either reach out to our team or seek emergency medical care. 3.  We will contact you with the results of your Pap smear and provide guidance for next steps.  Please call the clinic at [phone] if your symptoms worsen or you have any concerns. It was our pleasure to serve you!   Dr. Peggyann Shoals [facility]

## 2020-10-09 NOTE — Assessment & Plan Note (Signed)
Patient with several days of dysuria, urinary frequency, urgency.  UA with positive leukocytes, small hemoglobin. -symptoms and lab work are concerning for urinary tract infection.  Will treat with Keflex 500 mg 4 times daily for the next 5 days. -Patient instructed should she develop fevers, body aches, chills, or worsening urinary symptoms I recommend she reach out to our team or seek emergency medical care.

## 2020-10-09 NOTE — Progress Notes (Signed)
    SUBJECTIVE:   CHIEF COMPLAINT / HPI:   Concern for UTI: Patient reports that she recently started having lower abdominal pain with urinary frequency, strong odor to urine, urinary urgency, and some achiness in her back.  She denies any fevers, blood in her urine, body aches, chills.  History of LSIL: patient had pap in September 2020 which showed LSIL, negative for HPV. Ideally patient would have followed up for repeat pap in 1 year, however this did not occur. (Patient has MS, high risk for COVID Infection, patient had telemedicine visits instead over the last couple of years).   PERTINENT  PMH / PSH:  Patient Active Problem List   Diagnosis Date Noted  . 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 COVID-19 09/10/2019  . Seasonal allergies 06/13/2019  . Shortness of breath on exertion 06/13/2019  . 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 Attention deficit disorder   . Class 1 obesity due to excess calories without serious comorbidity with body mass index (BMI) of 30.0 to 30.9 in adult   . Dysuria 04/10/2016  . Depression 03/28/2016  . 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 Relapsing remitting multiple sclerosis (HCC) 11/15/2015    OBJECTIVE:   BP 132/65   Pulse 98   Ht 5\' 6"  (1.676 m)   Wt 206 lb 3.2 oz (93.5 kg)   LMP 10/05/2020   SpO2 99%   BMI 33.28 kg/m    Physical exam: General: Well-appearing, pleasant patient Respiratory: CTA bilaterally, comfortable work of breathing GU: Labia minora and majora without lesion, scant clear/white vaginal discharge appreciated, normal-appearing vaginal rugae, cervix without lesion, closed os, no discharge appreciated  ASSESSMENT/PLAN:   Dysuria Patient with several days of dysuria, urinary frequency, urgency.  UA with positive leukocytes, small hemoglobin. -symptoms and lab work are concerning for urinary tract infection.  Will treat with Keflex 500 mg 4 times daily for the next 5 days. -Patient  instructed should she develop fevers, body aches, chills, or worsening urinary symptoms I recommend she reach out to our team or seek emergency medical care.   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 -Pap smear repeated at today's visit -We will follow-up with results and treat as needed     October 2020, DO [facility]

## 2020-10-10 ENCOUNTER — Telehealth: Payer: Self-pay

## 2020-10-10 MED ORDER — SULFAMETHOXAZOLE-TRIMETHOPRIM 800-160 MG PO TABS: 1.0000 | ORAL_TABLET | Freq: Two times a day (BID) | ORAL | 0 refills | Status: AC

## 2020-10-10 NOTE — Telephone Encounter (Signed)
Patient returns call to nurse line to check status of previous message. Patient reports that she feels "fine" now and that most of the symptoms related to allergic reaction were yesterday.   Patient would like new antibiotic as soon as possible.   Veronda Prude, RN

## 2020-10-10 NOTE — Addendum Note (Signed)
Addended by: Dollene Cleveland on: 10/10/2020 01:17 PM   Modules accepted: Orders

## 2020-10-10 NOTE — Progress Notes (Signed)
Received call from patient that she was having a rash yesterday 02/28 with Keflex. She only took one then stopped. The rash and symptoms went away on their own.   Patient to stop Keflex, will initiate Bactrim 800 BID x5 days. Patient instructed to call us/take benadryl should another rash develop, or seek emergency medical care.   Peggyann Shoals, DO [facility], PGY-3 10/10/2020 1:17 PM

## 2020-10-10 NOTE — Telephone Encounter (Signed)
Patient calls nurse line due to allergic reaction to Keflex. Patient reports that she has red,itchy hives and is having slight difficulty breathing. Patient is able to speak in complete sentences at this time. Advised patient proceed to the ED for evaluation due to current sxms and history of anaphylaxis to penicillin.   Patient verbalizes understanding. Patient is also requesting that provider send in alternative rx.   Please advise.   Veronda Prude, RN

## 2020-10-11 LAB — URINE CULTURE

## 2020-10-16 LAB — CYTOLOGY - PAP
Chlamydia: NEGATIVE
Comment: NEGATIVE
Comment: NEGATIVE
Comment: NORMAL
Diagnosis: UNDETERMINED — AB
High risk HPV: NEGATIVE
Neisseria Gonorrhea: NEGATIVE

## 2020-10-20 ENCOUNTER — Encounter: Payer: Self-pay | Admitting: Family Medicine

## 2020-10-20 ENCOUNTER — Telehealth: Payer: Self-pay

## 2020-10-20 NOTE — Telephone Encounter (Signed)
Patient calls nurse line reporting she was contacted and advised of pap results. Patient has some questions and concerns regarding her abnormal pap. Patient would like to know why her pap smear was abnormal again, as patient has a hx of abnormal paps in the past. Patient would like to know the causes. Will forward to provider who saw patient.

## 2020-11-02 ENCOUNTER — Ambulatory Visit: Payer: Medicaid Other | Admitting: Neurology

## 2020-11-02 ENCOUNTER — Encounter: Payer: Self-pay | Admitting: Neurology

## 2020-11-02 VITALS — BP 131/90 | HR 92 | Ht 66.0 in | Wt 208.0 lb

## 2020-11-02 DIAGNOSIS — Z79899 Other long term (current) drug therapy: Secondary | ICD-10-CM | POA: Diagnosis not present

## 2020-11-02 DIAGNOSIS — G35 Multiple sclerosis: Secondary | ICD-10-CM | POA: Diagnosis not present

## 2020-11-02 DIAGNOSIS — H469 Unspecified optic neuritis: Secondary | ICD-10-CM | POA: Diagnosis not present

## 2020-11-02 DIAGNOSIS — F902 Attention-deficit hyperactivity disorder, combined type: Secondary | ICD-10-CM

## 2020-11-02 MED ORDER — AMPHETAMINE-DEXTROAMPHETAMINE 10 MG PO TABS
10.0000 mg | ORAL_TABLET | Freq: Two times a day (BID) | ORAL | 0 refills | Status: DC
Start: 2020-11-02 — End: 2022-05-23

## 2020-11-02 MED ORDER — ACETAMINOPHEN-CODEINE #3 300-30 MG PO TABS
1.0000 | ORAL_TABLET | Freq: Three times a day (TID) | ORAL | 5 refills | Status: DC
Start: 2020-11-02 — End: 2021-09-03

## 2020-11-02 NOTE — Progress Notes (Signed)
[facility]  PATIENT: Alicia Barker DOB: [birth year]  REFERRING DOCTOR OR PCP:  PCP is Francoise Ceo SOURCE: patient, records from hospital, MRI / lab reports, MRI images on PACS  _________________________________  - HISTORICAL  CHIEF COMPLAINT:  Chief Complaint  Patient presents with  . Follow-up    RM 12, alone. Last seen 01/04/2020 by AL,NP. On Ocrevus for MS.  Missed infusion 07/2020. Has not r/s. Started having panic attacks after covid-19 vaccine 12/21. Did not get second dose because of this. Spoke w/ infusion suite and got her r/s for 11/14/20 and 12/28/20 (she has to restart initial dosing). Had her sign updated start form as well.     HISTORY OF PRESENT ILLNESS:  Alicia Barker is a 35 y.o. woman who was diagnosed with MS in March 2017.     Update 11/02/2020:  She is on Ocrevus.  The last infusion was 01/04/2020. She never rescheduled a missed Ocrevus in late 2021.  Her last exacerbation with optic neuritis was in July 2020.    Her gait is unchanged.  Her left leg is mildly worse than the right.  She denies much numbness or tingling.   She reports light perception only vision out of the left eye.  She can read easily out of the right eye.  Due to urinary urge incontinence, she wears pads.    She has sleep maintenance > sleep onset insomnia.    She has fatigue.    She notes some depression.    She notes mild memory issues and diffciulty coming up with the right words at times     MS History:    In early March, 2017, she had the onset of slurred speech and leg weakness and ataxia.    A head CT was reportedly normal.    Over the next couple weeks, she had right worse than left visual acuity issues, more numbness and more clumsiness.        Her gait became unsteady.     She also noted mid back pain.    She went to the ER and was diagnosed with an inner ear problem.   She went back to ER 11/15/15 and was found to have an abnormal MRI consistent with MS.     MRI of the spine also showed additional plaques.     She received 5 days of IV Steroids and noticed an improvement with improved gait but continued numbness.    I have reviewed the MRIs of the brain and spine performed for 12/30/2015 and 11/17/2015. The MRI of the brain with and without contrast shows multiple T2/FLAIR hyperintense foci, many in the periventricular white matter.    Another focus is the pons (with mild enhancement, better seen on c-spine MRI).Marland Kitchen 7 foci enhanced after gadolinium administration imply more recent MS plaques.    There are foci at C2 and C3 on cervical spine MRI and the focus at C2 had slight enhancement.    Another enhancing focus is noted within the thoracic spine adjacent to T3-T4.   She started Tecfidera after I first saw her 11/28/2015.   She had a significant relapse and was switched over to ocrelizumab if her first dose around January or February 2018.    She had an exacerbation with severe left ON in 06/2018 and possibly mild right ON 02/2019.  Imaging: MRI brain 06/02/2021 showed T2/flair hyperintense foci in the hemispheres, right middle cerebellar peduncle and right pons in a pattern and configuration consistent with chronic demyelinating plaque associated with multiple sclerosis.  None of the foci appear to be acute and they do not enhance.  Compared to the MRI dated 07/10/2018, there do not appear to be any new lesions.    REVIEW OF SYSTEMS: Constitutional: No fevers, chills, sweats, or change in appetite.  She reports fatigue and poor sleep  Eyes: mild right visual changes, double vision, eye pain Ear, nose and throat: No hearing loss, ear pain, nasal congestion, sore throat Cardiovascular: No chest pain, palpitations Respiratory: No shortness of breath at rest or with exertion.   No wheezes GastrointestinaI: No nausea, vomiting, diarrhea, abdominal pain, fecal  incontinence Genitourinary: No dysuria, urinary retention.   She has some urgency and frequency..  She has some nocturia. Musculoskeletal: Reports neck pain, back pain Integumentary: No rash, pruritus, skin lesions Neurological: as above Psychiatric: Notes depression > anxiety.    Endocrine: No palpitations, diaphoresis, change in appetite, change in weigh or increased thirst Hematologic/Lymphatic: No anemia, purpura, petechiae. Allergic/Immunologic: No itchy/runny eyes, nasal congestion, recent allergic reactions, rashes  ALLERGIES: Allergies  Allergen Reactions  . Penicillins Anaphylaxis, Shortness Of Breath and Swelling    Has patient had a PCN reaction causing immediate rash, facial/tongue/throat swelling, SOB or lightheadedness with hypotension: YES Has patient had a PCN reaction causing severe rash involving mucus membranes or skin necrosis: NO Has patient had a PCN reaction that required hospitalization NO Has patient had a PCN reaction occurring within the last 10 years: NO If all of the above answers are "NO", then may proceed with Cephalosporin use.  . Zoloft [Sertraline Hcl]     Jittery per pt    HOME MEDICATIONS:  Current Outpatient Medications:  .  acetaminophen-codeine (TYLENOL #3) 300-30 MG tablet, Take 1 tablet by mouth 3 (three) times daily., Disp: 90 tablet, Rfl: 0 .  albuterol (VENTOLIN HFA) 108 (90 Base) MCG/ACT inhaler, Inhale 2 puffs into the lungs every 4 (four) hours as needed for wheezing or shortness of breath., Disp: 18 g, Rfl: 0 .  amphetamine-dextroamphetamine (ADDERALL) 10 MG tablet, Take 1 tablet (10 mg total) by mouth 2 (two) times daily., Disp: 60 tablet, Rfl: 0 .  benzonatate (TESSALON) 100 MG capsule, Take 1 capsule (100 mg total) by mouth every 8 (eight) hours., Disp: 21 capsule, Rfl: 0 .  cyclobenzaprine (FLEXERIL) 5 MG tablet, TAKE 1 TABLET BY MOUTH EVERY NIGHT AT BEDTIME. TAKE UP TO THREE TIMES DAILY (Patient taking differently: Take 5 mg by  mouth as needed for muscle spasms.), Disp: 90 tablet, Rfl: 11 .  escitalopram (LEXAPRO) 10 MG tablet, Take 1 tablet (10 mg total) by mouth daily., Disp: 30 tablet, Rfl: 11 .  fluticasone (FLONASE) 50 MCG/ACT nasal spray, Place 2 sprays into both nostrils daily., Disp: 16 g, Rfl: 6 .  ibuprofen (ADVIL,MOTRIN) 800 MG tablet, TAKE 1 TABLET BY MOUTH AS NEEDED (Patient taking differently: Take 800 mg by mouth every 6 (six) hours as needed for mild pain.), Disp: 30 tablet, Rfl: 0 .  meclizine (ANTIVERT) 25 MG tablet, Take 1 tablet (25 mg total) by mouth 3 (three) times daily as needed for dizziness., Disp: 30 tablet, Rfl: 0 .  ocrelizumab (OCREVUS) 300 MG/10ML injection, Inject 20 mLs (600 mg total) into the vein every 6 (six) months., Disp: 20 mL, Rfl: 1 .  ondansetron (ZOFRAN) 4 MG tablet, Take 1 tablet (4  mg total) by mouth as needed for nausea or vomiting., Disp: 3 tablet, Rfl: 0  PAST MEDICAL HISTORY: Past Medical History:  Diagnosis Date  . Depression    no meds currently  . MS (multiple sclerosis) (HCC)   . SVD (spontaneous vaginal delivery)    x 5    PAST SURGICAL HISTORY: Past Surgical History:  Procedure Laterality Date  . IR FLUORO GUIDE CV LINE RIGHT  07/16/2018  . IR REMOVAL TUN CV CATH W/O FL  08/04/2018  . IR US GUIDE VASC ACCESS RIGHT  07/16/2018  . LAPAROSCOPIC TUBAL LIGATION Bilateral 01/06/2013   Procedure: LAPAROSCOPIC TUBAL LIGATION;  Surgeon: Kathreen Cosier, MD;  Location: [facility];  Service: Gynecology;  Laterality: Bilateral;  . right hand surgery      FAMILY HISTORY: Family History  Problem Relation Age of Onset  . Diabetes Mother   . Heart disease Mother   . Diabetes Father   . Multiple sclerosis Cousin     SOCIAL HISTORY:  Social History   Socioeconomic History  . Marital status: Married    Spouse name: Not on file  . Number of children: Not on file  . Years of education: Not on file  . Highest education level: Not on file  Occupational History  .  Not on file  Tobacco Use  . Smoking status: Former Smoker    Packs/day: 0.10    Types: Cigarettes  . Smokeless tobacco: Never Used  Substance and Sexual Activity  . Alcohol use: No    Alcohol/week: 0.0 standard drinks  . Drug use: No  . Sexual activity: Yes    Birth control/protection: None  Other Topics Concern  . Not on file  Social History Narrative  . Not on file   Social Determinants of Health   Financial Resource Strain: Not on file  Food Insecurity: Not on file  Transportation Needs: Not on file  Physical Activity: Not on file  Stress: Not on file  Social Connections: Not on file  Intimate Partner Violence: Not on file     PHYSICAL EXAM  Vitals:   11/02/20 1533  BP: 131/90  Pulse: 92  Weight: 208 lb (94.3 kg)  Height: 5\' 6"  (1.676 m)    Body mass index is 33.57 kg/m.   Hearing Screening   125Hz  250Hz  500Hz  1000Hz  2000Hz  3000Hz  4000Hz  6000Hz  8000Hz   Right ear:           Left ear:             Visual Acuity Screening   Right eye Left eye Both eyes  Without correction:     With correction: 20/20 unable 20/20     General: The patient is well-developed and well-nourished and in no acute distress.   She has no rashes.   She has left optic disc pallor    Neurologic Exam  Mental status: The patient is alert and oriented x 3 at the time of the examination. The patient has apparent normal recent and remote memory, with an apparently normal attention span and concentration ability.   Speech is normal.  Cranial nerves: Extraocular movements are full.  2+ left APD.   She is hand waving only OS and 20/20 OD.  Facial strength and sensation is normal. Trapezius and sternocleidomastoid strength is normal. No dysarthria is noted.  The tongue is midline, and the patient has symmetric elevation of the soft palate. No obvious hearing deficits are noted.  Motor:  Muscle bulk is normal.   Muscle  tone is normal.  Strength was 5/5 in the arms and the right leg in the  proximal left leg but 4+/5 in the left ankle and toe extensors  Sensory:   Vibration sensation and touch sensation is normal and symmetric in the legs.  Coordination: Finger-nose-finger is normal.  Decreased coordination of the legs, worse on the left.  Gait and station: Station is normal.  She has a left foot drop and a wide gait.  She cannot do a tandem walk.  Romberg is negative.   Reflexes: Deep tendon reflexes are symmetric and normal bilaterally.           DIAGNOSTIC DATA (LABS, IMAGING, TESTING) - I reviewed patient records, labs, notes, testing and imaging myself where available.  Lab Results  Component Value Date   WBC 6.0 01/04/2020   HGB 11.1 01/04/2020   HCT 34.0 01/04/2020   MCV 88 01/04/2020   PLT 283 01/04/2020      Component Value Date/Time   NA 141 01/04/2020 1425   K 4.5 01/04/2020 1425   CL 106 01/04/2020 1425   CO2 23 01/04/2020 1425   GLUCOSE 76 01/04/2020 1425   GLUCOSE 101 (H) 05/04/2019 1900   BUN 9 01/04/2020 1425   CREATININE 0.79 01/04/2020 1425   CALCIUM 9.7 01/04/2020 1425   PROT 6.2 01/04/2020 1425   ALBUMIN 4.3 01/04/2020 1425   AST 14 01/04/2020 1425   ALT 12 01/04/2020 1425   ALKPHOS 65 01/04/2020 1425   BILITOT <0.2 01/04/2020 1425   GFRNONAA 98 01/04/2020 1425   GFRAA 113 01/04/2020 1425       ASSESSMENT AND PLAN    1. Relapsing remitting multiple sclerosis (HCC)   2. Optic neuritis due to multiple sclerosis (HCC)   3. Attention deficit hyperactivity disorder (ADHD), combined type   4. High risk medication use     1.   Resume Ocrevus 600 mg every 6 months.  We will recheck blood work. 2.   Due to difficulty with gait, vision and cognition/fatigue, she is disabled. 3.   Return in 3 months or sooner if there are new or worsening neurologic symptoms. We discussed symptoms of  possible exacerbations.   Elgar Scoggins A. Epimenio Foot, MD, PhD 11/02/2020, 4:07 PM Certified in Neurology, Clinical Neurophysiology, Sleep Medicine, Pain Medicine  and Neuroimaging  [facility] [address], [address] [place], [place] [address] [phone]

## 2020-11-06 ENCOUNTER — Telehealth: Payer: Self-pay | Admitting: *Deleted

## 2020-11-06 ENCOUNTER — Telehealth: Payer: Self-pay | Admitting: Neurology

## 2020-11-06 LAB — QUANTIFERON-TB GOLD PLUS
QuantiFERON Mitogen Value: >10 IU/mL
QuantiFERON Nil Value: 0.03 IU/mL
QuantiFERON TB1 Ag Value: 0.04 IU/mL
QuantiFERON TB2 Ag Value: 0.05 IU/mL
QuantiFERON-TB Gold Plus: NEGATIVE

## 2020-11-06 LAB — CBC WITH DIFFERENTIAL/PLATELET
Basophils Absolute: 0.1 10*3/uL (ref 0.0–0.2)
Basos: 1 %
EOS (ABSOLUTE): 0.3 10*3/uL (ref 0.0–0.4)
Eos: 6 %
Hematocrit: 33.3 % — ABNORMAL LOW (ref 34.0–46.6)
Hemoglobin: 11.6 g/dL (ref 11.1–15.9)
Immature Grans (Abs): 0.1 10*3/uL (ref 0.0–0.1)
Immature Granulocytes: 1 %
Lymphocytes Absolute: 1.8 10*3/uL (ref 0.7–3.1)
Lymphs: 29 %
MCH: 30.6 pg (ref 26.6–33.0)
MCHC: 34.8 g/dL (ref 31.5–35.7)
MCV: 88 fL (ref 79–97)
Monocytes Absolute: 0.4 10*3/uL (ref 0.1–0.9)
Monocytes: 7 %
Neutrophils Absolute: 3.4 10*3/uL (ref 1.4–7.0)
Neutrophils: 56 %
Platelets: 328 10*3/uL (ref 150–450)
RBC: 3.79 x10E6/uL (ref 3.77–5.28)
RDW: 13.2 % (ref 11.7–15.4)
WBC: 6.1 10*3/uL (ref 3.4–10.8)

## 2020-11-06 LAB — HEPATIC FUNCTION PANEL
ALT: 20 IU/L (ref 0–32)
AST: 16 IU/L (ref 0–40)
Albumin: 4.2 g/dL (ref 3.8–4.8)
Alkaline Phosphatase: 72 IU/L (ref 44–121)
Bilirubin Total: <0.2 mg/dL (ref 0.0–1.2)
Bilirubin, Direct: <0.1 mg/dL (ref 0.00–0.40)
Total Protein: 6.7 g/dL (ref 6.0–8.5)

## 2020-11-06 LAB — IGG, IGA, IGM
IgA/Immunoglobulin A, Serum: 149 mg/dL (ref 87–352)
IgG (Immunoglobin G), Serum: 1017 mg/dL (ref 586–1602)
IgM (Immunoglobulin M), Srm: 84 mg/dL (ref 26–217)

## 2020-11-06 LAB — HEPATITIS B SURFACE ANTIBODY,QUALITATIVE: Hep B Surface Ab, Qual: NONREACTIVE

## 2020-11-06 LAB — HEPATITIS B CORE ANTIBODY, TOTAL: Hep B Core Total Ab: NEGATIVE

## 2020-11-06 LAB — HEPATITIS B SURFACE ANTIGEN: Hepatitis B Surface Ag: NEGATIVE

## 2020-11-06 NOTE — Telephone Encounter (Signed)
mcd [facility] community RDEY:[id number] (exp. 11/06/20 to 12/21/20) order sent to GI. They will reach out to the patient to schedule.

## 2020-11-06 NOTE — Telephone Encounter (Signed)
LVM for pt reminding her to bring by copy of insurance cards today (we spoke last week about this). Advised we are open until 5pm. We need this in order to get her restarted on Ocrevus.

## 2020-11-07 NOTE — Telephone Encounter (Signed)
Called and spoke with pt. She will come now to bring her insurance cards to have front desk scan in. Advised her to have them let me know once it is completed. I also sent front a message.   She was able to pick up Tylenol #3 from pharmacy using goodrx. Aware we are going to try to see if insurance will cover it. Will update pharmacy once we hear from her insurance. She verbalized understanding.

## 2020-11-08 NOTE — Telephone Encounter (Signed)
Pt brought insurance info. Faxed complete/signed Ocrevus start form to [employer] at [phone]. Received fax confirmation. Gave completed start form to [employer] for them to process.

## 2020-11-08 NOTE — Telephone Encounter (Signed)
Request Reference Number: [id number]. APAP/CODEINE TAB 300-30MG  is approved through 05/11/2021. For further questions, call [employer] at [phone].

## 2020-11-08 NOTE — Telephone Encounter (Signed)
Pt did not bring insurance card info yesterday after speaking on the phone. I called pharmacy and got Rx BIN: [id number], PCN: [id number], grp: [id number]. ID: [id number] M. Submitted PA Tylenol #3 on CMM. OTL:[id number]. Waiting on determination from [employer].   We still need actual insurance cards in order for infusion suite to get approval for Ocrevus.

## 2020-11-19 ENCOUNTER — Other Ambulatory Visit: Payer: Medicaid Other

## 2020-11-26 ENCOUNTER — Ambulatory Visit
Admission: RE | Admit: 2020-11-26 | Discharge: 2020-11-26 | Disposition: A | Discharge status: Home / Self Care | Payer: Medicaid Other | Source: Ambulatory Visit | Attending: Neurology | Admitting: Neurology

## 2020-11-26 ENCOUNTER — Other Ambulatory Visit: Payer: Self-pay

## 2020-11-26 DIAGNOSIS — G35 Multiple sclerosis: Secondary | ICD-10-CM

## 2020-11-26 MED ORDER — GADOBENATE DIMEGLUMINE 529 MG/ML IV SOLN
20.0000 mL | Freq: Once | INTRAVENOUS | Status: AC | PRN
  Administered 2020-11-26: 20 mL via INTRAVENOUS

## 2020-12-18 DIAGNOSIS — G35 Multiple sclerosis: Secondary | ICD-10-CM | POA: Diagnosis not present

## 2020-12-28 DIAGNOSIS — Z20822 Contact with and (suspected) exposure to covid-19: Secondary | ICD-10-CM | POA: Diagnosis not present

## 2021-03-11 NOTE — Patient Instructions (Signed)
Alicia Barker, It was wonderful to meeting you today. Thank you for allowing me to be a part of your care. Below is a short summary of what we discussed at your visit today:  Urinary Track Infection We discussed some symptoms   Mammogram      Please bring all of your medications to every appointment!  If you have any questions or concerns, please do not hesitate to contact us via phone or MyChart message.   Jerre Simon, MD [facility]

## 2021-03-11 NOTE — Progress Notes (Deleted)
    SUBJECTIVE:   CHIEF COMPLAINT / HPI:   Alicia Barker is a 35 yo female who presented with concerns for UTI and referral for mammogram  UTI  Mammogram  PERTINENT  PMH / PSH: UTI, MS,   ROS  OBJECTIVE:  Vitals:  Physical Exam: General: Cardiovascular: Respiratory: Abdomen: Extremities:  ASSESSMENT/PLAN:   No problem-specific Assessment & Plan notes found for this encounter.     Jerre Simon, MD [facility]   {    This will disappear when note is signed, click to select method of visit    :1}

## 2021-03-12 ENCOUNTER — Encounter: Payer: Medicaid Other | Admitting: Student

## 2021-03-18 NOTE — Progress Notes (Deleted)
     SUBJECTIVE:   CHIEF COMPLAINT / HPI:   Alicia Barker is a 35 y.o. female presents for UTI sx  Urinary sx Pt endorses lower abdominal pain, dysuria, frequency, urgency or hematuria.    ***  Flowsheet Row Office Visit from 10/09/2020 in [facility]  PHQ-9 Total Score 22        Health Maintenance Due  Topic   COVID-19 Vaccine (1)   Pneumococcal Vaccine 56-28 Years old (1 - PCV)   INFLUENZA VACCINE       PERTINENT  PMH / PSH:   OBJECTIVE:   There were no vitals taken for this visit.   General: Alert, no acute distress Cardio: Normal S1 and S2, RRR, no r/m/g Pulm: CTAB, normal work of breathing Abdomen: Bowel sounds normal. Abdomen soft and non-tender.  Extremities: No peripheral edema.  Neuro: Cranial nerves grossly intact   ASSESSMENT/PLAN:   No problem-specific Assessment & Plan notes found for this encounter.     Towanda Octave, MD PGY-3 [facility]

## 2021-03-19 ENCOUNTER — Ambulatory Visit: Payer: Medicaid Other

## 2021-03-26 ENCOUNTER — Ambulatory Visit (INDEPENDENT_AMBULATORY_CARE_PROVIDER_SITE_OTHER): Payer: Medicaid Other | Admitting: Family Medicine

## 2021-03-26 ENCOUNTER — Other Ambulatory Visit: Payer: Self-pay

## 2021-03-26 VITALS — BP 114/82 | HR 95 | Ht 66.0 in | Wt 212.4 lb

## 2021-03-26 DIAGNOSIS — F32A Depression, unspecified: Secondary | ICD-10-CM | POA: Diagnosis present

## 2021-03-26 DIAGNOSIS — R0602 Shortness of breath: Secondary | ICD-10-CM | POA: Diagnosis not present

## 2021-03-26 MED ORDER — ESCITALOPRAM OXALATE 20 MG PO TABS
20.0000 mg | ORAL_TABLET | Freq: Every day | ORAL | 0 refills | Status: DC
Start: 2021-03-26 — End: 2023-09-11

## 2021-03-26 MED ORDER — ALBUTEROL SULFATE HFA 108 (90 BASE) MCG/ACT IN AERS: 2.0000 | INHALATION_SPRAY | RESPIRATORY_TRACT | 0 refills | Status: DC | PRN

## 2021-03-26 NOTE — Progress Notes (Signed)
    SUBJECTIVE:   CHIEF COMPLAINT / HPI:   Breast cancer screening Patient is here requesting breast exam.  She states that an app told her it was time for a breast exam.  She performs self breast exams and has not noted any abnormalities.  No family medical history of breast cancer.  Discussed no indication for clinical breast exam or mammogram until at least age 35 as she does not have increased risk.  Dyspnea Needs refill on albuterol. Sometimes uses it multiple times a week. Requesting referral for pulmonology. Was referred before but missed appointment.  Patient states that she had asthma as a kid which she outgrew, but asthma symptoms returned after contracting COVID. On a previous visit with Dr. Mauri Reading in October 2020, she had reported dyspnea on exertion occurring 2-3 times per year.  At that time, she was referred to pulmonology as PFTs were not being done and [facility] clinic due to COVID.  She is amenable to scheduling for PFTs in clinic.  Depression Reports occasional passive SI due to MS, but denies active SI.  She is on escitalopram 10 mg but does not feel it is doing much.  She does not see a therapist but is amenable to seeing one.  PERTINENT  PMH / PSH: MS, depression  OBJECTIVE:   BP 114/82   Pulse 95   Ht 5\' 6"  (1.676 m)   Wt 212 lb 6.4 oz (96.3 kg)   LMP 03/17/2021   SpO2 99%   BMI 34.28 kg/m   General: Obese young female, NAD CV: RRR, no murmurs Pulm: CTAB, no wheezes or rales  Depression screen PHQ 2/9 03/26/2021  Decreased Interest 3  Down, Depressed, Hopeless 3  PHQ - 2 Score 6  Altered sleeping 3  Tired, decreased energy 3  Change in appetite 2  Feeling bad or failure about yourself  3  Trouble concentrating 3  Moving slowly or fidgety/restless 3  Suicidal thoughts 1  PHQ-9 Score 24  Difficult doing work/chores Extremely dIfficult     ASSESSMENT/PLAN:   Shortness of breath on exertion Albuterol refilled.  Patient to schedule with pharmacy clinic for  PFTs.  Unclear if asthma versus long COVID versus other etiology.  Could consider controller medication if she is having frequent flares and PFTs consistent with asthma.  Depression Not well controlled.  Occasional passive SI but no active SI.  Will increase escitalopram to 20 mg.  Therapy resources given.  Follow-up in 1 month.   Discussed with patient that clinical breast exams are not necessary and we will plan to start screening for breast cancer with mammogram when she is at least 40.  Average risk for breast cancer.  03/28/2021, MD [facility]

## 2021-03-26 NOTE — Patient Instructions (Addendum)
It was nice seeing you today!  Albuterol refilled. Schedule a visit with the pharmacy clinic for pulmonary function testing.  Therapy resources below. Lexapro dose increased to 20 mg.  Mammogram screening to start when you're at least 40.  Follow-up with your PCP in 1 month.  Please arrive at least 15 minutes prior to your scheduled appointments.  Stay well, Alicia Deeds, MD [facility] [phone]    Therapy and Counseling Resources Most providers on this list will take Medicaid. Patients with commercial insurance or Medicare should contact their insurance company to get a list of in network providers.  [facility]iat[facility] and Counseling [address] [place], [place] [address] [phone]  [facility]  [address], Suite [place], [place] [address]      [phone]  [facility] [address]  [place], [place] [address] [phone]  [facility] [address], [address]  [place], [place] [address]       [phone]     [facility] (virtual only) [phone]  [facility]ov[facility]uss[facility] [address] [place], [place] [phone]  [facility]:  [address] [place] [phone]  [facility]:  [address] [place] [place]ien[place] [phone]  [facility] (under & uninsured) [address]   [place] [phone]    [email or web address]    [facility] [address]  [place]    [phone]  [facility] [place] [address]     Phone:  [phone]     [facility]  [address]  [phone]   [facility] [address]      [place], [place] [phone]  [facility]: [address] [place], [place], [place]  [phone]   [facility] (Spanish therapist) [email or web address]  [address] [place]  [address]   [place] [address]    [phone]    [facility]   [address],  [place], [place]  [phone]   [facility]  [address] [place]  [phone]  [facility]  [address] [place], [place]        [phone]  Open Access/Walk In Clinic under & uninsured  [facility]  [address] [place], [place] [phone] Crisis [phone]  [facility] [place],  (Spanish)   [address], [place]: [phone]) 8:30 - 12; 1 - 2:30  [facility] [employer],  [address] [place], [place]    [phone]):8:30 - 12; 2 - 3PM  [facility] [employer],  [address],  [place]; [phone]):   Mon - Fri 8 AM - 5 PM  [facility] [address] [place]  MWF 12:30 to 3:00 or call to schedule an appointment  [phone]  Specific Provider options Psychology Today  [email or web address] click on find a therapist  enter your zip code left side and select or tailor a therapist for your specific need.   [facility] Provider Directory [email or web address]  (Medicaid)   Follow all drop down to find a provider  Social Support program [facility] [place] [phone] or Photo[employer]pl [address], [place], [place] Recovery support and educational   24- Hour Availability:   [facility]  [address] [place], [place] [phone] Crisis [phone]  [facility] [phone]  [facility]  [phone]   [facility]  [phone] (after hours)  Therapeutic Alternative/Mobile Crisis   [phone]  [place] National Suicide Hotline  [phone] Len Childs)  Call 911 or go to emergency room  [facility]  [phone]);  [place] and  [employer]  [phone]); [place], [place], [place], [place], [place], [place], [place]

## 2021-03-26 NOTE — Assessment & Plan Note (Addendum)
Albuterol refilled.  Patient to schedule with pharmacy clinic for PFTs.  Unclear if asthma versus long COVID versus other etiology.  Could consider controller medication if she is having frequent flares and PFTs consistent with asthma.

## 2021-03-26 NOTE — Assessment & Plan Note (Signed)
Not well controlled.  Occasional passive SI but no active SI.  Will increase escitalopram to 20 mg.  Therapy resources given.  Follow-up in 1 month.

## 2021-03-30 ENCOUNTER — Ambulatory Visit: Payer: Medicaid Other | Admitting: Pharmacist

## 2021-03-30 ENCOUNTER — Other Ambulatory Visit: Payer: Self-pay

## 2021-04-10 ENCOUNTER — Ambulatory Visit: Payer: Medicaid Other

## 2021-04-19 ENCOUNTER — Ambulatory Visit: Payer: Medicaid Other | Admitting: Pharmacist

## 2021-04-22 NOTE — Progress Notes (Deleted)
    SUBJECTIVE:   CHIEF COMPLAINT / HPI:   Presents for follow up for uncontrolled depression. Seen in clinic on 08/15 and Citalopram was increased to 20 mg daily.  Since then patient reports improvement in symptoms **. Associated symptoms include **.    PERTINENT  PMH / PSH: ***  OBJECTIVE:   There were no vitals taken for this visit.   General: Alert, no acute distress Cardio: Normal S1 and S2, RRR, no r/m/g Pulm: CTAB, normal work of breathing Abdomen: Bowel sounds normal. Abdomen soft and non-tender.  Extremities: No peripheral edema.  Neuro: Cranial nerves grossly intact   ASSESSMENT/PLAN:   No problem-specific Assessment & Plan notes found for this encounter.     Dana Allan, MD [facility]

## 2021-04-24 ENCOUNTER — Ambulatory Visit: Payer: Medicaid Other | Admitting: Family Medicine

## 2021-05-10 ENCOUNTER — Other Ambulatory Visit: Payer: Self-pay | Admitting: Family Medicine

## 2021-07-04 ENCOUNTER — Telehealth: Payer: Self-pay | Admitting: *Deleted

## 2021-07-04 NOTE — Telephone Encounter (Signed)
LVM at [phone] for pt to call office.   Called [phone]. Husband picked up. Gave phone to pt. She states she has been trying to make appt but has balance to pay at office. Last spoke w/ billing a couple weeks ago. Aware Angie in billing will call her at [phone]. She did not want to be transferred d/t husband needing phone back. Asked she be called at her # (aware I tried her # first). She will discuss paying something towards balance so she can be seen. She will keep phone near.  Received fax from [facility] that they cannot determine patient's eligibility. Pt has not been seen since 11/02/20. Was supposed to f/u in three months. No pending appt. I spoke w/ infusion suite. She had first day of 1/2 dose Ocrevus 12/18/20 but then no showed twice after that for second day. Has not infused since. She will need to be seen first to discuss treatment plan moving forward.  I took call back from [facility]. Pt made payment. I scheduled work in appt for 07/12/21 at 2:00p w/ Dr. Epimenio Foot. Asked she check in at 1:30p, bring updated insurance cards and med list. She verbalized understanding and appreciation.

## 2021-07-12 ENCOUNTER — Encounter: Payer: Self-pay | Admitting: Neurology

## 2021-07-12 ENCOUNTER — Ambulatory Visit: Payer: Medicaid Other | Admitting: Neurology

## 2021-07-18 NOTE — Progress Notes (Signed)
Encounter error

## 2021-08-30 ENCOUNTER — Ambulatory Visit: Payer: Medicaid Other | Admitting: Neurology

## 2021-08-30 ENCOUNTER — Other Ambulatory Visit: Payer: Self-pay | Admitting: *Deleted

## 2021-08-30 ENCOUNTER — Encounter: Payer: Self-pay | Admitting: Neurology

## 2021-08-30 VITALS — BP 100/72 | HR 88 | Ht 66.0 in | Wt 225.5 lb

## 2021-08-30 DIAGNOSIS — G4700 Insomnia, unspecified: Secondary | ICD-10-CM | POA: Diagnosis not present

## 2021-08-30 DIAGNOSIS — F902 Attention-deficit hyperactivity disorder, combined type: Secondary | ICD-10-CM

## 2021-08-30 DIAGNOSIS — R5382 Chronic fatigue, unspecified: Secondary | ICD-10-CM

## 2021-08-30 DIAGNOSIS — G35 Multiple sclerosis: Secondary | ICD-10-CM

## 2021-08-30 DIAGNOSIS — F32A Depression, unspecified: Secondary | ICD-10-CM | POA: Diagnosis not present

## 2021-08-30 DIAGNOSIS — Z79899 Other long term (current) drug therapy: Secondary | ICD-10-CM

## 2021-08-30 DIAGNOSIS — H469 Unspecified optic neuritis: Secondary | ICD-10-CM

## 2021-08-30 MED ORDER — TRAZODONE HCL 100 MG PO TABS: ORAL_TABLET | ORAL | 11 refills | Status: DC

## 2021-08-30 MED ORDER — ETODOLAC 400 MG PO TABS: 400.0000 mg | ORAL_TABLET | Freq: Two times a day (BID) | ORAL | 5 refills | Status: DC

## 2021-08-30 MED ORDER — OCREVUS 300 MG/10ML IV SOLN: 600.0000 mg | INTRAVENOUS | 1 refills | Status: AC

## 2021-08-30 NOTE — Addendum Note (Signed)
Addended by: Despina Arias A on: 08/30/2021 10:22 AM   Modules accepted: Orders

## 2021-08-30 NOTE — Progress Notes (Addendum)
[facility]  PATIENT: Alicia T KASHMERE [birth year]  REFERRING DOCTOR OR PCP:  PCP is Francoise Ceo SOURCE: patient, records from hospital, MRI / lab reports, MRI images on PACS  _________________________________  - HISTORICAL  CHIEF COMPLAINT:  Chief Complaint  Patient presents with   Follow-up    Rm 2, alone. Here for MS f/u, on Ocrevus. Last seen 11/02/20. Last infusion: 12/18/2020-300mg , Next infusion: 01/02/2021-300mg -no show, 01/09/21-no show. Pt reports feeling exhausted. Having difficulty sleeping at night. Having trouble falling sleep. L Leg is starting to drag. C/o of neck pn.     HISTORY OF PRESENT ILLNESS:  Alicia Barker is a 36 y.o. woman who was diagnosed with MS in March 2017.     Update 08/30/2021:  She is on Ocrevus.  The last infusion was 12/18/2020 (300 mg) and she no-showed the rescheduled 5/24 and 5/31 visits. She never rescheduled a missed Ocrevus in late 2021.  Her last exacerbation with optic neuritis was in July 2020.    Her gait is mostly unchanged.  Her left leg is mildly worse than the right.  Sometimes she drags it - esp if she goes longer distance.  She has numbness and pins/needles tingling in hands and feet.   She reports light perception only vision out of the left eye.  She can read easily out of the right eye.  Due to urinary urge incontinence, she wears pads.    She has poor sleep most nights with both sleep maintenance and sleep onset insomnia.  She did not sleep last night.    She has fatigue which is worse due to poor sleep.    She notes some depression.    She notes mild memory issues and diffciulty coming up with the right words at times    She reports back and neck pain.  Hands hurt at times, esp with use.      MS History:    In early March, 2017, she had the onset of slurred speech and leg weakness and ataxia.    A head CT was reportedly normal.    Over the next couple weeks, she had right worse than left visual  acuity issues, more numbness and more clumsiness.        Her gait became unsteady.     She also noted mid back pain.    She went to the ER and was diagnosed with an inner ear problem.   She went back to ER 11/15/15 and was found to have an abnormal MRI consistent with MS.    MRI of the spine also showed additional plaques.     She received 5 days of IV Steroids and noticed an improvement with improved gait but continued numbness.    I have reviewed the MRIs of the brain and spine performed for 12/30/2015 and 11/17/2015. The MRI of the brain with and without contrast shows multiple T2/FLAIR hyperintense foci, many in the periventricular white matter.    Another focus is the pons (with mild enhancement, better seen on c-spine MRI).Marland Kitchen 7 foci enhanced after gadolinium administration imply more recent MS plaques.    There are foci at C2 and C3 on cervical spine MRI and the focus at C2 had slight enhancement.    Another enhancing focus is noted within the thoracic spine adjacent to T3-T4.   She started Tecfidera after I first saw her 11/28/2015.   She had a significant relapse and was switched over to ocrelizumab if her first dose around  January or February 2018.    She had an exacerbation with severe left ON in 06/2018 and possibly mild right ON .02/2019.                                                                                     Imaging: MRI brain 06/02/2021 showed T2/flair hyperintense foci in the hemispheres, right middle cerebellar peduncle and right pons in a pattern and configuration consistent with chronic demyelinating plaque associated with multiple sclerosis.  None of the foci appear to be acute and they do not enhance.  Compared to the MRI dated 07/10/2018, there do not appear to be any new lesions.    REVIEW OF SYSTEMS: Constitutional: No fevers, chills, sweats, or change in appetite.  She reports fatigue and poor sleep  Eyes: mild right visual changes, double vision, eye pain Ear, nose and  throat: No hearing loss, ear pain, nasal congestion, sore throat Cardiovascular: No chest pain, palpitations Respiratory:  No shortness of breath at rest or with exertion.   No wheezes GastrointestinaI: No nausea, vomiting, diarrhea, abdominal pain, fecal incontinence Genitourinary:  No dysuria, urinary retention.   She has some urgency and frequency..  She has some nocturia. Musculoskeletal:  Reports neck pain, back pain Integumentary: No rash, pruritus, skin lesions Neurological: as above Psychiatric: Notes depression > anxiety.    Endocrine: No palpitations, diaphoresis, change in appetite, change in weigh or increased thirst Hematologic/Lymphatic:  No anemia, purpura, petechiae. Allergic/Immunologic: No itchy/runny eyes, nasal congestion, recent allergic reactions, rashes  ALLERGIES: Allergies  Allergen Reactions   Penicillins Anaphylaxis, Shortness Of Breath and Swelling    Has patient had a PCN reaction causing immediate rash, facial/tongue/throat swelling, SOB or lightheadedness with hypotension: YES Has patient had a PCN reaction causing severe rash involving mucus membranes or skin necrosis: NO Has patient had a PCN reaction that required hospitalization NO Has patient had a PCN reaction occurring within the last 10 years: NO If all of the above answers are "NO", then may proceed with Cephalosporin use.   Zoloft [Sertraline Hcl]     Jittery per pt    HOME MEDICATIONS:  Current Outpatient Medications:    acetaminophen-codeine (TYLENOL #3) 300-30 MG tablet, Take 1 tablet by mouth 3 (three) times daily., Disp: 90 tablet, Rfl: 5   amphetamine-dextroamphetamine (ADDERALL) 10 MG tablet, Take 1 tablet (10 mg total) by mouth 2 (two) times daily., Disp: 60 tablet, Rfl: 0   benzonatate (TESSALON) 100 MG capsule, Take 1 capsule (100 mg total) by mouth every 8 (eight) hours., Disp: 21 capsule, Rfl: 0   cyclobenzaprine (FLEXERIL) 5 MG tablet, TAKE 1 TABLET BY MOUTH EVERY NIGHT AT BEDTIME.  TAKE UP TO THREE TIMES DAILY (Patient taking differently: Take 5 mg by mouth as needed for muscle spasms.), Disp: 90 tablet, Rfl: 11   escitalopram (LEXAPRO) 20 MG tablet, Take 1 tablet (20 mg total) by mouth daily., Disp: 90 tablet, Rfl: 0   etodolac (LODINE) 400 MG tablet, Take 1 tablet (400 mg total) by mouth 2 (two) times daily., Disp: 60 tablet, Rfl: 5   fluticasone (FLONASE) 50 MCG/ACT nasal spray, Place 2 sprays into both nostrils daily., Disp: 16 g,  Rfl: 6   meclizine (ANTIVERT) 25 MG tablet, Take 1 tablet (25 mg total) by mouth 3 (three) times daily as needed for dizziness., Disp: 30 tablet, Rfl: 0   ocrelizumab (OCREVUS) 300 MG/10ML injection, Inject 20 mLs (600 mg total) into the vein every 6 (six) months., Disp: 20 mL, Rfl: 1   ondansetron (ZOFRAN) 4 MG tablet, Take 1 tablet (4 mg total) by mouth as needed for nausea or vomiting., Disp: 3 tablet, Rfl: 0   PROAIR HFA 108 (90 Base) MCG/ACT inhaler, INHALE 2 PUFFS INTO THE LUNGS EVERY 4 HOURS AS NEEDED FOR WHEEZING OR SHORTNESS OF BREATH, Disp: 17 g, Rfl: 0   traZODone (DESYREL) 100 MG tablet, 1/2 to 1 pill qHS, Disp: 30 tablet, Rfl: 11  PAST MEDICAL HISTORY: Past Medical History:  Diagnosis Date   Depression    no meds currently   MS (multiple sclerosis) (HCC)    SVD (spontaneous vaginal delivery)    x 5    PAST SURGICAL HISTORY: Past Surgical History:  Procedure Laterality Date   IR FLUORO GUIDE CV LINE RIGHT  07/16/2018   IR REMOVAL TUN CV CATH W/O FL  08/04/2018   IR US GUIDE VASC ACCESS RIGHT  07/16/2018   LAPAROSCOPIC TUBAL LIGATION Bilateral 01/06/2013   Procedure: LAPAROSCOPIC TUBAL LIGATION;  Surgeon: Kathreen Cosier, MD;  Location: [facility];  Service: Gynecology;  Laterality: Bilateral;   right hand surgery      FAMILY HISTORY: Family History  Problem Relation Age of Onset   Diabetes Mother    Heart disease Mother    Diabetes Father    Multiple sclerosis Cousin     SOCIAL HISTORY:  Social History    Socioeconomic History   Marital status: Married    Spouse name: Not on file   Number of children: Not on file   Years of education: Not on file   Highest education level: Not on file  Occupational History   Not on file  Tobacco Use   Smoking status: Former    Packs/day: 0.10    Types: Cigarettes   Smokeless tobacco: Never  Substance and Sexual Activity   Alcohol use: No    Alcohol/week: 0.0 standard drinks   Drug use: No   Sexual activity: Yes    Birth control/protection: None  Other Topics Concern   Not on file  Social History Narrative   Not on file   Social Determinants of Health   Financial Resource Strain: Not on file  Food Insecurity: Not on file  Transportation Needs: Not on file  Physical Activity: Not on file  Stress: Not on file  Social Connections: Not on file  Intimate Partner Violence: Not on file     PHYSICAL EXAM  Vitals:   08/30/21 0951  BP: 100/72  Pulse: 88  SpO2: 98%  Weight: 225 lb 8 oz (102.3 kg)  Height: 5\' 6"  (1.676 m)    Body mass index is 36.4 kg/m.  No results found.    General: The patient is well-developed and well-nourished and in no acute distress.   She has no rashes.   She has left optic disc pallor    Neurologic Exam  Mental status: The patient is alert and oriented x 3 at the time of the examination. The patient has apparent normal recent and remote memory, with an apparently normal attention span and concentration ability.   Speech is normal.  Cranial nerves: Extraocular movements are full.  2+ left APD.   She is  hand waving only OS and 20/20 OD.  Facial strength and sensation is normal. Trapezius and sternocleidomastoid strength is normal. No dysarthria is noted.  The tongue is midline, and the patient has symmetric elevation of the soft palate. No obvious hearing deficits are noted.  Motor:  Muscle bulk is normal.   Muscle tone is normal.  Strength was 5/5 in the arms and the right leg in the proximal left leg but  4+/5 in the left ankle and toe extensors  Sensory:   Vibration sensation and touch sensation is normal and symmetric in the legs.  Coordination: Finger-nose-finger is normal.  Decreased coordination of the legs, worse on the left.  Gait and station: Station is normal. Left foot drop.   Gait is slightly wide and tendem is wide but improved form last visit. .  Romberg is negative.   Reflexes: Deep tendon reflexes are symmetric and normal bilaterally.           DIAGNOSTIC DATA (LABS, IMAGING, TESTING) - I reviewed patient records, labs, notes, testing and imaging myself where available.  Lab Results  Component Value Date   WBC 6.1 11/02/2020   HGB 11.6 11/02/2020   HCT 33.3 (L) 11/02/2020   MCV 88 11/02/2020   PLT 328 11/02/2020      Component Value Date/Time   NA 141 01/04/2020 1425   K 4.5 01/04/2020 1425   CL 106 01/04/2020 1425   CO2 23 01/04/2020 1425   GLUCOSE 76 01/04/2020 1425   GLUCOSE 101 (H) 05/04/2019 1900   BUN 9 01/04/2020 1425   CREATININE 0.79 01/04/2020 1425   CALCIUM 9.7 01/04/2020 1425   PROT 6.7 11/02/2020 1621   ALBUMIN 4.2 11/02/2020 1621   AST 16 11/02/2020 1621   ALT 20 11/02/2020 1621   ALKPHOS 72 11/02/2020 1621   BILITOT <0.2 11/02/2020 1621   GFRNONAA 98 01/04/2020 1425   GFRAA 113 01/04/2020 1425       ASSESSMENT AND PLAN    1. Relapsing remitting multiple sclerosis (HCC)   2. Optic neuritis due to multiple sclerosis (HCC)   3. Attention deficit hyperactivity disorder (ADHD), combined type   4. High risk medication use   5. Chronic fatigue      1.   Resume Ocrevus 600 mg every 6 months.  We will recheck blood work.   She no-showed so I went over the importance and goals of DMT.   2.   Due to difficulty with gait, vision and cognition/fatigue, she is disabled. 3.   Etodolac 400 mg po bid for arthrotic pain and trazodone nightly for insomnia (50-100 mg)   .  She gets a benefit from Tylenol 3 and this will be continued for her chronic  pain. Return in 6 months or sooner if there are new or worsening neurologic symptoms.    Cherith Tewell A. Epimenio Foot, MD, PhD 08/30/2021, 10:19 AM Certified in Neurology, Clinical Neurophysiology, Sleep Medicine, Pain Medicine and Neuroimaging  [facility] [address] [place], [place] [address] [phone]

## 2021-08-31 ENCOUNTER — Other Ambulatory Visit: Payer: Self-pay | Admitting: Neurology

## 2021-08-31 LAB — CBC WITH DIFFERENTIAL/PLATELET
Basophils Absolute: 0 10*3/uL (ref 0.0–0.2)
Basos: 1 %
EOS (ABSOLUTE): 0.3 10*3/uL (ref 0.0–0.4)
Eos: 6 %
Hematocrit: 34.7 % (ref 34.0–46.6)
Hemoglobin: 11.6 g/dL (ref 11.1–15.9)
Immature Grans (Abs): 0 10*3/uL (ref 0.0–0.1)
Immature Granulocytes: 1 %
Lymphocytes Absolute: 1.8 10*3/uL (ref 0.7–3.1)
Lymphs: 36 %
MCH: 28.4 pg (ref 26.6–33.0)
MCHC: 33.4 g/dL (ref 31.5–35.7)
MCV: 85 fL (ref 79–97)
Monocytes Absolute: 0.5 10*3/uL (ref 0.1–0.9)
Monocytes: 9 %
Neutrophils Absolute: 2.4 10*3/uL (ref 1.4–7.0)
Neutrophils: 47 %
Platelets: 297 10*3/uL (ref 150–450)
RBC: 4.08 x10E6/uL (ref 3.77–5.28)
RDW: 14 % (ref 11.7–15.4)
WBC: 5.1 10*3/uL (ref 3.4–10.8)

## 2021-08-31 LAB — IGG, IGA, IGM
IgA/Immunoglobulin A, Serum: 171 mg/dL (ref 87–352)
IgG (Immunoglobin G), Serum: 1141 mg/dL (ref 586–1602)
IgM (Immunoglobulin M), Srm: 63 mg/dL (ref 26–217)

## 2021-09-03 NOTE — Telephone Encounter (Signed)
Received refill request for Aceta.-codeine 300-30mg .  Last OV was on 08/30/21.  Next OV is scheduled for 03/06/22 .  Last RX was written on 04/13/21 for 90 tabs.   [place] Drug Database has been reviewed.

## 2021-09-06 ENCOUNTER — Telehealth: Payer: Self-pay | Admitting: *Deleted

## 2021-09-06 NOTE — Telephone Encounter (Signed)
Initiated PA on CMM. Key: [id number] - PA Case ID: [id number].In process of completing.

## 2021-09-06 NOTE — Telephone Encounter (Signed)
Submitted PA. Waiting on determination from [employer].

## 2021-09-10 NOTE — Telephone Encounter (Signed)
Request Reference Number: [id number]. APAP/CODEINE TAB 300-30MG  is approved through 03/06/2022. For further questions, call [employer] at [phone].

## 2021-09-19 ENCOUNTER — Telehealth: Payer: Self-pay

## 2021-09-19 NOTE — Telephone Encounter (Signed)
I spoke with Dr. Epimenio Foot. Patient's last ocrevus infusion was 12/2020 at the 300mg  dose. She did not complete the next 300mg  dose.  He would like her to resume Ocrevus at the 600mg  dose ASAP.  Patient confirmed via mychart message that she is also agreeable to this plan.  Ocrevus order given to the Infusion suite for processing.

## 2021-10-01 NOTE — Telephone Encounter (Signed)
Patient had her Ocrevus infusion on 09/26/2021.

## 2021-10-15 ENCOUNTER — Other Ambulatory Visit: Payer: Self-pay | Admitting: Neurology

## 2021-10-17 NOTE — Telephone Encounter (Signed)
Last OV was on 08/30/21.  ?Next OV is scheduled for 03/06/22 .  ?Last RX was written on 09/10/21 for 90 tabs.  ? ?[place] Drug Database has been reviewed.  ?

## 2021-10-22 ENCOUNTER — Telehealth: Payer: Self-pay | Admitting: Neurology

## 2021-10-22 NOTE — Telephone Encounter (Signed)
Kendal Hymen @ [address] is asking if an appeal will be filed for [employer], please call [phone] option [phone] ?

## 2021-10-22 NOTE — Telephone Encounter (Signed)
Sent message to [facility] asking they f/u on this ?

## 2021-11-24 ENCOUNTER — Other Ambulatory Visit: Payer: Self-pay | Admitting: Neurology

## 2021-11-26 NOTE — Telephone Encounter (Signed)
Last OV was on 08/30/21.  ?Next OV is scheduled for 03/06/22.  ?Last RX was written on 10/17/21 for 90 tabs.  ? ?[place] Drug Database has been reviewed.  ?

## 2021-12-03 ENCOUNTER — Encounter: Payer: Self-pay | Admitting: Neurology

## 2022-01-15 ENCOUNTER — Encounter: Payer: Self-pay | Admitting: *Deleted

## 2022-03-06 ENCOUNTER — Encounter: Payer: Self-pay | Admitting: Neurology

## 2022-03-06 ENCOUNTER — Ambulatory Visit: Payer: Medicare Other | Admitting: Neurology

## 2022-03-21 ENCOUNTER — Other Ambulatory Visit: Payer: Self-pay | Admitting: Neurology

## 2022-03-21 MED ORDER — ACETAMINOPHEN-CODEINE 300-30 MG PO TABS: 1.0000 | ORAL_TABLET | Freq: Three times a day (TID) | ORAL | 0 refills | Status: DC

## 2022-03-21 NOTE — Telephone Encounter (Signed)
Pt request refill for acetaminophen-codeine (TYLENOL #3) 300-30 MG tablet at [facility] #[id number]

## 2022-03-21 NOTE — Telephone Encounter (Signed)
Per drug registry, last refilled 11/26/21 #90.  Last seen 08/30/21

## 2022-03-25 ENCOUNTER — Other Ambulatory Visit: Payer: Self-pay | Admitting: Neurology

## 2022-03-25 ENCOUNTER — Telehealth: Payer: Self-pay | Admitting: Neurology

## 2022-03-25 MED ORDER — ACETAMINOPHEN-CODEINE 300-30 MG PO TABS: 1.0000 | ORAL_TABLET | Freq: Three times a day (TID) | ORAL | 0 refills | Status: DC

## 2022-03-25 MED ORDER — ACETAMINOPHEN-CODEINE 300-30 MG PO TABS: 1.0000 | ORAL_TABLET | Freq: Three times a day (TID) | ORAL | 1 refills | Status: DC

## 2022-03-25 NOTE — Addendum Note (Signed)
Addended by: Ann Maki on: 03/25/2022 04:52 PM   Modules accepted: Orders

## 2022-03-25 NOTE — Addendum Note (Signed)
Addended by: Asa Lente on: 03/25/2022 05:33 PM   Modules accepted: Orders

## 2022-03-25 NOTE — Telephone Encounter (Signed)
Pt is calling and said her prescription for acetaminophen-codeine (TYLENOL #3) 300-30 MG tablet was sent to the wrong pharmacy. Pt is requesting prescription be sent to [facility]  [address] [place] [address]. (P) [phone]

## 2022-03-27 ENCOUNTER — Telehealth: Payer: Self-pay | Admitting: Neurology

## 2022-03-27 NOTE — Telephone Encounter (Signed)
LVM informing pt to call back to schedule f/u appointment with Dr. Epimenio Foot. Per Dr. Epimenio Foot, pt is to be scheduled within the next month

## 2022-04-17 ENCOUNTER — Telehealth: Payer: Self-pay | Admitting: Student

## 2022-04-17 NOTE — Telephone Encounter (Signed)
..   Medicaid Managed Care   Unsuccessful Outreach Note  04/17/2022 Name: Alicia Barker MRN: [id number] DOB: [birth year]  Referred by: Glendale Chard, DO Reason for referral : High Risk Managed Medicaid (I called the patient today to get her scheduled with the MM Team. I left my name and number on her VM.)   An unsuccessful telephone outreach was attempted today. The patient was referred to the case management team for assistance with care management and care coordination.   Follow Up Plan: The care management team will reach out to the patient again over the next 14 days.    Weston Settle Care Guide, High Risk Medicaid Managed Care Embedded Care Coordination [facility]  [facility]    SIGNATURE

## 2022-04-18 ENCOUNTER — Telehealth: Payer: Self-pay | Admitting: Neurology

## 2022-04-18 NOTE — Telephone Encounter (Signed)
Printed this note and provided to Tony/[facility]. Advised that pt needs to have OV before next infusion for updated visit/labs. Asked they let us know if pt calls/have pt speak with [place] about getting OV scheduled.

## 2022-04-18 NOTE — Telephone Encounter (Signed)
LVM for pt to call office to schedule f/u. She is past due for appt. Also sent mychart message.   [facility] back at [phone]. Spoke w/ rep. Advised she gets Ocrevus here at our office w/ [facility]. No pre meds needed, [facility] handles this. She verbalized understanding, nothing further needed.  They will reach out to pt to get consent for shipment.  They have another number on file for pt: [phone]. I tried calling pt at this number.  Had to LVM for pt to call office.

## 2022-04-18 NOTE — Telephone Encounter (Signed)
Terah from [facility] called wanting to know if the pt will be receiving her Infusion at Home or in the Office. Please advise.

## 2022-05-15 NOTE — Progress Notes (Deleted)
    SUBJECTIVE:   CHIEF COMPLAINT / HPI:   ***  PERTINENT  PMH / PSH: MS, ADD, optic neuritis   OBJECTIVE:   There were no vitals taken for this visit.  ***  ASSESSMENT/PLAN:   No problem-specific Assessment & Plan notes found for this encounter.     Darci Current, [place]

## 2022-05-16 ENCOUNTER — Ambulatory Visit: Payer: Medicare Other | Admitting: Student

## 2022-05-22 NOTE — Patient Instructions (Signed)
It was great to see you! Thank you for allowing me to participate in your care!  I recommend that you always bring your medications to each appointment as this makes it easy to ensure we are on the correct medications and helps [place] not miss when refills are needed.  Our plans for today:  -For your left-sided back and flank pain, you gave [place] a urine sample to analyze your urine.  If it is positive for a urinary tract infection I will call you and send antibiotics to your pharmacy.  If it is more indicative of possible kidney stones I will have you follow-up in 1 week at your already scheduled appointment with Dr. Sabra Heck to possibly order CT scan. -Follow-up with Dr. Sabra Heck on October 20 for a Pap smear for cervical cancer screening.  We are checking some labs today, I will call you if they are abnormal will send you a MyChart message or a letter if they are normal.  If you do not hear about your labs in the next 2 weeks please let us know.  Take care and seek immediate care sooner if you develop any concerns.   Dr. Salvadore Oxford, MD [facility]

## 2022-05-22 NOTE — Progress Notes (Signed)
    SUBJECTIVE:   CHIEF COMPLAINT / HPI: follow-up pap smear  Flank Pain - Reports flanks pains since Sunday. Intermittent achy. States this is similar to when she previously had a UTI. Endorses some urinary frequency, denies burning. Denies blood in urine, nausea, vomiting, vaginal discharge or pain.  Depression - Takes Lexapro 20mg . States mood is decent overall. PHQ9 -16. Negative question 9.  MS - Needs neurology appointment before next infusion of Ocrevus. Patient states she had appointment on September 20th with [facility] however this is not indicated in epic. Per telephone call encounter on the 7th patient would require appointment for next infusion. Patient states she had one recently and is next scheduled for march. Denies any new neurologic symptoms.   Health Maintenance - Pap follow-up. Patient reports being told to follow-up for pap smear in 6 months around 1.5 years ago. Per chart review patient saw Dr. Ouida Sills at this time. Pap smear at that time indicated ASCUS, HPV negative. 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 Patient expresses preference for female provider for pap exam.   PERTINENT  PMH / PSH: MS, Depression  OBJECTIVE:   BP 121/77   Pulse 76   Ht 5\' 6"  (1.676 m)   Wt 227 lb 9.6 oz (103.2 kg)   LMP 05/06/2022   SpO2 100%   BMI 36.74 kg/m   General: NAD Respiratory: normal WOB on RA Abdomen: soft, NTTP, no rebound or guarding, no costovertebral angle tenderness Extremities: Moving all 4 extremities equally   ASSESSMENT/PLAN:   Acute left flank pain Vitals and exam reassuring.  Differential includes UTI, kidney stone, lower lumbar musculature strain, MS. Pain intensity and pattern not consistent with nephrolithiasis, possible lumbar strain, but given association with urinary frequency, likely UTI.  -UA ordered, will treat as indicated -Consider reevaluation for nephrolithiasis at next appointment and possible renal CT.   -Consider  [facility] pathology, discuss treatment at follow-up visit as well.  ASCUS of cervix with negative high risk HPV Pap smear result of ASCUS in February 2022, negative HPV.  Was recommended to follow-up in 6 months at that time however following up now.  After shared decision making with patient she would prefer a female provider for Pap exam and possibly STD testing.  Discussed close follow-up given it is been a year and a half and scheduled Ms. Palma for appointment next week with PCP.  Relapsing remitting multiple sclerosis ([place]) Continue Ocrevus infusions per Neurology.  Depression Stable. Continue Lexapro 20mg  daily.     Salvadore Oxford, MD [place]

## 2022-05-23 ENCOUNTER — Encounter: Payer: Self-pay | Admitting: Family Medicine

## 2022-05-23 ENCOUNTER — Telehealth: Payer: Self-pay | Admitting: Neurology

## 2022-05-23 ENCOUNTER — Ambulatory Visit (INDEPENDENT_AMBULATORY_CARE_PROVIDER_SITE_OTHER): Payer: Medicare Other | Admitting: Family Medicine

## 2022-05-23 VITALS — BP 121/77 | HR 76 | Ht 66.0 in | Wt 227.6 lb

## 2022-05-23 DIAGNOSIS — F32A Depression, unspecified: Secondary | ICD-10-CM | POA: Diagnosis not present

## 2022-05-23 DIAGNOSIS — G35 Multiple sclerosis: Secondary | ICD-10-CM | POA: Diagnosis not present

## 2022-05-23 DIAGNOSIS — R87610 Atypical squamous cells of undetermined significance on cytologic smear of cervix (ASC-US): Secondary | ICD-10-CM | POA: Diagnosis not present

## 2022-05-23 DIAGNOSIS — R109 Unspecified abdominal pain: Secondary | ICD-10-CM

## 2022-05-23 LAB — POCT UA - MICROSCOPIC ONLY
RBC, Urine, Miroscopic: NONE SEEN (ref 0–2)
WBC, Ur, HPF, POC: NONE SEEN (ref 0–5)

## 2022-05-23 LAB — POCT URINALYSIS DIP (MANUAL ENTRY)
Bilirubin, UA: NEGATIVE
Blood, UA: NEGATIVE
Glucose, UA: NEGATIVE mg/dL
Leukocytes, UA: NEGATIVE
Nitrite, UA: NEGATIVE
Protein Ur, POC: 30 mg/dL — AB
Spec Grav, UA: 1.025 (ref 1.010–1.025)
Urobilinogen, UA: 1 E.U./dL
pH, UA: 5.5 (ref 5.0–8.0)

## 2022-05-23 NOTE — Assessment & Plan Note (Signed)
Continue Ocrevus infusions per Neurology.

## 2022-05-23 NOTE — Telephone Encounter (Signed)
Pt needs an appt. Has not been since January and doesn't have an appt on the books. She needs to follow up every 6 months for control substance medication also because of her ocrevus. Please call and have the patient schedule an appt if stable can be with NP if nothing available with Dr Felecia Shelling.

## 2022-05-23 NOTE — Telephone Encounter (Signed)
Pt called wanting to know if her acetaminophen-codeine (TYLENOL #3) 300-30 MG tablet can be called in to the [facility] on [address]

## 2022-05-23 NOTE — Assessment & Plan Note (Addendum)
Vitals and exam reassuring.  Differential includes UTI, kidney stone, lower lumbar musculature strain, MS. Pain intensity and pattern not consistent with nephrolithiasis, possible lumbar strain, but given association with urinary frequency, likely UTI.  -UA ordered, will treat as indicated -Consider reevaluation for nephrolithiasis at next appointment and possible renal CT.   -Consider [facility] pathology, discuss treatment at follow-up visit as well.

## 2022-05-23 NOTE — Assessment & Plan Note (Addendum)
Pap smear result of ASCUS in February 2022, negative HPV.  Was recommended to follow-up in 6 months at that time however following up now.  After shared decision making with patient she would prefer a female provider for Pap exam and possibly STD testing.  Discussed close follow-up given it is been a year and a half and scheduled Alicia Barker for appointment next week with PCP.

## 2022-05-23 NOTE — Assessment & Plan Note (Signed)
Stable.  Continue Lexapro 20mg daily

## 2022-05-25 ENCOUNTER — Telehealth: Payer: Medicare Other | Admitting: Nurse Practitioner

## 2022-05-25 DIAGNOSIS — B029 Zoster without complications: Secondary | ICD-10-CM

## 2022-05-26 ENCOUNTER — Other Ambulatory Visit: Payer: Self-pay | Admitting: Nurse Practitioner

## 2022-05-26 ENCOUNTER — Encounter: Payer: Medicare Other | Admitting: Nurse Practitioner

## 2022-05-26 DIAGNOSIS — B029 Zoster without complications: Secondary | ICD-10-CM

## 2022-05-26 DIAGNOSIS — B027 Disseminated zoster: Secondary | ICD-10-CM

## 2022-05-26 MED ORDER — GABAPENTIN 300 MG PO CAPS: 300.0000 mg | ORAL_CAPSULE | Freq: Two times a day (BID) | ORAL | 0 refills | Status: AC

## 2022-05-26 MED ORDER — VALACYCLOVIR HCL 1 G PO TABS: 1000.0000 mg | ORAL_TABLET | Freq: Three times a day (TID) | ORAL | 0 refills | Status: AC

## 2022-05-26 MED ORDER — GABAPENTIN 300 MG PO CAPS: 300.0000 mg | ORAL_CAPSULE | Freq: Two times a day (BID) | ORAL | 0 refills | Status: DC

## 2022-05-26 MED ORDER — VALACYCLOVIR HCL 1 G PO TABS: 1000.0000 mg | ORAL_TABLET | Freq: Three times a day (TID) | ORAL | 0 refills | Status: DC

## 2022-05-26 NOTE — Progress Notes (Signed)
I have spent 5 minutes in review of e-visit questionnaire, review and updating patient chart, medical decision making and response to patient.  ° °Izzie Geers W Earland Reish, NP ° °  °

## 2022-05-26 NOTE — Progress Notes (Signed)
Ms. Nusbaum states her pharmacy did not receive her medication. I have instructed her of the confirmation receipt from the pharmacy at 8:24 this morning. She will contact them again

## 2022-05-26 NOTE — Progress Notes (Signed)
E-visit for Shingles   We are sorry that you are not feeling well. Here is how we plan to help! If your rash does not improve after 7 days you will need to be seen in person for a face to face visit to evaluate the rash as it may require an alternate form of treatment based on evaluation.  Based on what you shared with me it looks like you have shingles.  Shingles or herpes zoster, is a common infection of the nerves.  It is a painful rash caused by the herpes zoster virus.  This is the same virus that causes chickenpox.  After a person has chickenpox, the virus remains inactive in the nerve cells.  Years later, the virus can become active again and travel to the skin.  It typically will appear on one side of the face or body.  Burning or shooting pain, tingling, or itching are early signs of the infection.  Blisters typically scab over in 7 to 10 days and clear up within 2-4 weeks. Shingles is only contagious to people that have never had the chickenpox, the chickenpox vaccine, or anyone who has a compromised immune system.  You should avoid contact with these type of people until your blisters scab over.  I have prescribed Valacyclovir 1g three times daily for 7 days and also Gabapentin 300mg  twice daily as needed for pain   HOME CARE: Apply ice packs (wrapped in a thin towel), cool compresses, or soak in cool bath to help reduce pain. Use calamine lotion to calm itchy skin. Avoid scratching the rash. Avoid direct sunlight.  GET HELP RIGHT AWAY IF: Symptoms that don't away after treatment. A rash or blisters near your eye. Increased drainage, fever, or rash after treatment. Severe pain that doesn't go away.   MAKE SURE YOU   Understand these instructions. Will watch your condition. Will get help right away if you are not doing well or get worse.  Thank you for choosing an e-visit.  Your e-visit answers were reviewed by a board certified advanced clinical practitioner to complete  your personal care plan. Depending upon the condition, your plan could have included both over the counter or prescription medications.  Please review your pharmacy choice. Make sure the pharmacy is open so you can pick up prescription now. If there is a problem, you may contact your provider through [employer] and have the prescription routed to another pharmacy.  Your safety is important to [place]. If you have drug allergies check your prescription carefully.   For the next 24 hours you can use MyChart to ask questions about today's visit, request a non-urgent call back, or ask for a work or school excuse. You will get an email in the next two days asking about your experience. I hope that your e-visit has been valuable and will speed your recovery.

## 2022-05-31 ENCOUNTER — Ambulatory Visit: Payer: Medicare Other | Admitting: Student

## 2022-06-07 ENCOUNTER — Ambulatory Visit: Payer: Medicare Other | Admitting: Student

## 2022-06-07 ENCOUNTER — Telehealth: Payer: Self-pay

## 2022-06-07 ENCOUNTER — Other Ambulatory Visit: Payer: Self-pay | Admitting: Nurse Practitioner

## 2022-06-07 ENCOUNTER — Other Ambulatory Visit: Payer: Self-pay | Admitting: Student

## 2022-06-07 DIAGNOSIS — L299 Pruritus, unspecified: Secondary | ICD-10-CM

## 2022-06-07 DIAGNOSIS — B029 Zoster without complications: Secondary | ICD-10-CM

## 2022-06-07 MED ORDER — TRIAMCINOLONE ACETONIDE 0.5 % EX OINT: 1.0000 | TOPICAL_OINTMENT | Freq: Two times a day (BID) | CUTANEOUS | 0 refills | Status: AC

## 2022-06-07 NOTE — Telephone Encounter (Signed)
Called patient and informed of provider's message.   Patient will follow up for next scheduled appointment.   Talbot Grumbling, RN

## 2022-06-07 NOTE — Progress Notes (Signed)
Received call from nurse line with patient having itchiness from her scabbed over shingles infection.  She reports itchiness is severe enough to where it would wake her up at night.  She has finished her duration of Valtrex.  I will send in a low potency steroid cream, Kenalog 0.5% for her to apply to the area twice daily.  I also need her to follow-up in clinic for this problem.  Darci Current, DO [facility], PGY-1 06/07/22 4:15 PM

## 2022-06-07 NOTE — Telephone Encounter (Signed)
Patient calls nurse line regarding follow up on shingles. She reports that she was diagnosed on 05/25/22 and she has now finished the Valtrex. She states that she has an area on left side that is very itching. She states that area has healed, however, she is having itching where area has scabbed. She states that it itches to the point she is having difficulty with sleep.   She apologizes that she was not able to keep appointment today and scheduled for next available with PCP.   She is requesting medication or recommendation for what she can do to help with this. Will forward to PCP for next steps in management.   Talbot Grumbling, RN

## 2022-06-20 ENCOUNTER — Ambulatory Visit (INDEPENDENT_AMBULATORY_CARE_PROVIDER_SITE_OTHER): Payer: Medicare Other | Admitting: Student

## 2022-06-20 ENCOUNTER — Other Ambulatory Visit (HOSPITAL_COMMUNITY)
Admission: RE | Admit: 2022-06-20 | Discharge: 2022-06-20 | Disposition: A | Discharge status: Home / Self Care | Payer: Medicare Other | Source: Ambulatory Visit | Attending: Family Medicine | Admitting: Family Medicine

## 2022-06-20 ENCOUNTER — Other Ambulatory Visit: Payer: Self-pay

## 2022-06-20 ENCOUNTER — Encounter: Payer: Self-pay | Admitting: Student

## 2022-06-20 VITALS — BP 115/77 | HR 96 | Wt 222.4 lb

## 2022-06-20 DIAGNOSIS — Z1151 Encounter for screening for human papillomavirus (HPV): Secondary | ICD-10-CM | POA: Insufficient documentation

## 2022-06-20 DIAGNOSIS — Z01419 Encounter for gynecological examination (general) (routine) without abnormal findings: Secondary | ICD-10-CM | POA: Diagnosis present

## 2022-06-20 DIAGNOSIS — Z8742 Personal history of other diseases of the female genital tract: Secondary | ICD-10-CM | POA: Diagnosis not present

## 2022-06-20 DIAGNOSIS — B028 Zoster with other complications: Secondary | ICD-10-CM | POA: Diagnosis not present

## 2022-06-20 DIAGNOSIS — R87610 Atypical squamous cells of undetermined significance on cytologic smear of cervix (ASC-US): Secondary | ICD-10-CM | POA: Diagnosis not present

## 2022-06-20 DIAGNOSIS — B029 Zoster without complications: Secondary | ICD-10-CM | POA: Insufficient documentation

## 2022-06-20 MED ORDER — AMITRIPTYLINE HCL 25 MG PO TABS: 25.0000 mg | ORAL_TABLET | Freq: Every day | ORAL | 0 refills | Status: DC

## 2022-06-20 MED ORDER — LIDOCAINE 5 % EX OINT: 1.0000 | TOPICAL_OINTMENT | CUTANEOUS | 0 refills | Status: DC | PRN

## 2022-06-20 NOTE — Assessment & Plan Note (Signed)
Pap preformed today with HPV  - will follow up results

## 2022-06-20 NOTE — Progress Notes (Signed)
    SUBJECTIVE:   CHIEF COMPLAINT / HPI:   Alicia Barker is a 36 y.o. female  presenting for Pap smear and follow up for singles infection and itching.   Her shingles rash is severely itchy at night to the point where it wakes her from sleeping. She has tried Kenalog cream, benadryl cream, PO benadryl, neosporin with no relief.   PERTINENT  PMH / PSH: MS, Shingles, chronic fatigue   OBJECTIVE:   BP 115/77   Pulse 96   Wt 222 lb 6.4 oz (100.9 kg)   LMP 05/06/2022   SpO2 98%   BMI 35.90 kg/m   Well-appearing, no acute distress Cardio: Regular rate, regular rhythm, no murmurs on exam. Pulm: Clear, no wheezing, no crackles. No increased work of breathing Abdominal: bowel sounds present, soft, non-tender, non-distended Extremities: no peripheral edema   Skin: multiple herpetic lesions in different stages of healing on her left midline to her left side of her chest  Pelvic Exam: MA chaperone present  Normal external genitalia No abnormal discharge  No cervical motion tenderness  Cervix visualized with no lesions    ASSESSMENT/PLAN:   ASCUS of cervix with negative high risk HPV Pap preformed today with HPV  - will follow up results   Shingles rash With increased itching and no relief with Kenalog cream will try lidocaine cream during the day with amitriptyline 25 mg PO at night. Patient is on SSRI and trazodone therapy, noted increased risk for serotonin syndrome but gave patient short course of TCA of 21 days. Patient in agreement with plan.     Glendale Chard, DO [facility]

## 2022-06-20 NOTE — Patient Instructions (Signed)
It was great to see you today!   Today we addressed: Pap smear - I will let you know your results via MyChart  I have prescribed a medication for your itching to help you sleep at night - Amitriptyline  I have also prescribed Lidocaine cream to help during the day.   You should return to our clinic Return in about 6 months (around 12/19/2022).  Please arrive 15 minutes before your appointment to ensure smooth check in process.    Please call the clinic at [phone] if your symptoms worsen or you have any concerns.  Thank you for allowing me to participate in your care, Dr. Glendale Chard [facility]

## 2022-06-20 NOTE — Assessment & Plan Note (Addendum)
With increased itching and no relief with Kenalog cream will try lidocaine cream during the day with amitriptyline 25 mg PO at night. Patient is on SSRI and trazodone therapy, noted increased risk for serotonin syndrome but gave patient short course of TCA of 21 days. Patient in agreement with plan.

## 2022-06-24 LAB — CYTOLOGY - PAP
Adequacy: ABSENT
Comment: NEGATIVE
Diagnosis: NEGATIVE
High risk HPV: NEGATIVE

## 2022-10-01 ENCOUNTER — Telehealth: Payer: Self-pay | Admitting: *Deleted

## 2022-10-01 NOTE — Telephone Encounter (Signed)
Per Holly/intrafusion pt has infusion scheduled 10/30/22. But she will need office visit/labs first before getting infused. Pt told Earnest Bailey she has outstanding bill with our office and was told she cannot r/s until this is resolved. Earnest Bailey will call pt back and let her know to reach out to billing to make a plan for outstanding balance/make payment. Once done, she can get scheduled.

## 2022-10-12 ENCOUNTER — Other Ambulatory Visit: Payer: Self-pay | Admitting: Neurology

## 2022-10-12 DIAGNOSIS — G35 Multiple sclerosis: Secondary | ICD-10-CM

## 2022-10-21 ENCOUNTER — Telehealth: Payer: Self-pay | Admitting: Neurology

## 2022-10-21 NOTE — Telephone Encounter (Signed)
Noted  

## 2022-10-21 NOTE — Telephone Encounter (Signed)
Sharitta, I advised you could schedule with NP if pt stable/doing well. I see you scheduled with Jessica,NP on 10/23/22. Did you confirm with pt that she is stable/doing well? If so, we can have her see NP.

## 2022-10-21 NOTE — Telephone Encounter (Signed)
Called pt back and she stated she is stable and doing well. Stated sje just needs this appointment to get her infusion treatment.

## 2022-10-21 NOTE — Telephone Encounter (Signed)
Pt called and stated she needs a appointment to see Dr. Felecia Shelling before she can get her infusion treatment on 3/20. I informed pt that Dr. Felecia Shelling next appointment is 8/1 and she said that not going to work. Pt is asking can she f/u with a NP?

## 2022-10-22 NOTE — Progress Notes (Deleted)
[facility]  PATIENT: Alicia Barker DOB: 01-20-1986  REFERRING DOCTOR OR PCP:  PCP is Gracy Racer SOURCE: patient, records from hospital, MRI / lab reports, MRI images on PACS  _________________________________  - HISTORICAL  CHIEF COMPLAINT:  No chief complaint on file.   HISTORY OF PRESENT ILLNESS:  Alicia Barker is a 37 y.o. woman who was diagnosed with MS in March 2017.     Update 10/23/2022:  Prior visit with Dr. Felecia Shelling on 08/30/2021. She no showed prior scheduled visit with Dr. Felecia Shelling on 03/06/2022. She is on Ocrevus.  The last infusion was ***, next infusion 10/30/2022.   and she no-showed the rescheduled 5/24 and 5/31 visits. She never rescheduled a missed Ocrevus in late 2021.  Her last exacerbation with optic neuritis was in July 2020.    Her gait is mostly unchanged.  Her left leg is mildly worse than the right.  Sometimes she drags it - esp if she goes longer distance.  She has numbness and pins/needles tingling in hands and feet.   She reports light perception only vision out of the left eye.  She can read easily out of the right eye.  Due to urinary urge incontinence, she wears pads.    She has poor sleep most nights with both sleep maintenance and sleep onset insomnia.  She did not sleep last night.    She has fatigue which is worse due to poor sleep.    She notes some depression.    She notes mild memory issues and diffciulty coming up with the right words at times    She reports back and neck pain.  Hands hurt at times, esp with use.      MS History:    In early March, 2017, she had the onset of slurred speech and leg weakness and ataxia.    A head CT was reportedly normal.    Over the next couple weeks, she had right worse than left visual acuity issues, more numbness and more clumsiness.        Her gait became unsteady.     She also noted mid back pain.    She went to the ER and was diagnosed with an inner ear problem.   She went back to  ER 11/15/15 and was found to have an abnormal MRI consistent with MS.    MRI of the spine also showed additional plaques.     She received 5 days of IV Steroids and noticed an improvement with improved gait but continued numbness.    I have reviewed the MRIs of the brain and spine performed for 12/30/2015 and 11/17/2015. The MRI of the brain with and without contrast shows multiple T2/FLAIR hyperintense foci, many in the periventricular white matter.    Another focus is the pons (with mild enhancement, better seen on c-spine MRI).Marland Kitchen 7 foci enhanced after gadolinium administration imply more recent MS plaques.    There are foci at C2 and C3 on cervical spine MRI and the focus at C2 had slight enhancement.    Another enhancing focus is noted within the thoracic spine adjacent to T3-T4.   She started Tecfidera after I first saw her 11/28/2015.   She had a significant relapse and was switched over to ocrelizumab if her first dose around January or February 2018.    She had an exacerbation with severe left ON in 06/2018 and possibly mild right ON .02/2019.  Imaging: MRI brain 06/02/2021 showed T2/flair hyperintense foci in the hemispheres, right middle cerebellar peduncle and right pons in a pattern and configuration consistent with chronic demyelinating plaque associated with multiple sclerosis.  None of the foci appear to be acute and they do not enhance.  Compared to the MRI dated 07/10/2018, there do not appear to be any new lesions.    REVIEW OF SYSTEMS: Constitutional: No fevers, chills, sweats, or change in appetite.  She reports fatigue and poor sleep  Eyes: mild right visual changes, double vision, eye pain Ear, nose and throat: No hearing loss, ear pain, nasal congestion, sore throat Cardiovascular: No chest pain, palpitations Respiratory:  No shortness of breath at rest or with exertion.   No wheezes GastrointestinaI:  No nausea, vomiting, diarrhea, abdominal pain, fecal incontinence Genitourinary:  No dysuria, urinary retention.   She has some urgency and frequency..  She has some nocturia. Musculoskeletal:  Reports neck pain, back pain Integumentary: No rash, pruritus, skin lesions Neurological: as above Psychiatric: Notes depression > anxiety.    Endocrine: No palpitations, diaphoresis, change in appetite, change in weigh or increased thirst Hematologic/Lymphatic:  No anemia, purpura, petechiae. Allergic/Immunologic: No itchy/runny eyes, nasal congestion, recent allergic reactions, rashes  ALLERGIES: Allergies  Allergen Reactions   Penicillins Anaphylaxis, Shortness Of Breath and Swelling    Has patient had a PCN reaction causing immediate rash, facial/tongue/throat swelling, SOB or lightheadedness with hypotension: YES Has patient had a PCN reaction causing severe rash involving mucus membranes or skin necrosis: NO Has patient had a PCN reaction that required hospitalization NO Has patient had a PCN reaction occurring within the last 10 years: NO If all of the above answers are "NO", then may proceed with Cephalosporin use.   Zoloft [Sertraline Hcl]     Jittery per pt    HOME MEDICATIONS:  Current Outpatient Medications:    acetaminophen-codeine (TYLENOL #3) 300-30 MG tablet, Take 1 tablet by mouth in the morning, at noon, and at bedtime., Disp: 90 tablet, Rfl: 1   amitriptyline (ELAVIL) 25 MG tablet, Take 1 tablet (25 mg total) by mouth at bedtime for 21 days., Disp: 21 tablet, Rfl: 0   benzonatate (TESSALON) 100 MG capsule, Take 1 capsule (100 mg total) by mouth every 8 (eight) hours., Disp: 21 capsule, Rfl: 0   escitalopram (LEXAPRO) 20 MG tablet, Take 1 tablet (20 mg total) by mouth daily., Disp: 90 tablet, Rfl: 0   fluticasone (FLONASE) 50 MCG/ACT nasal spray, Place 2 sprays into both nostrils daily., Disp: 16 g, Rfl: 6   gabapentin (NEURONTIN) 300 MG capsule, Take 1 capsule (300 mg total)  by mouth 2 (two) times daily., Disp: 30 capsule, Rfl: 0   lidocaine (XYLOCAINE) 5 % ointment, Apply 1 Application topically as needed., Disp: 35.44 g, Rfl: 0   meclizine (ANTIVERT) 25 MG tablet, Take 1 tablet (25 mg total) by mouth 3 (three) times daily as needed for dizziness., Disp: 30 tablet, Rfl: 0   ocrelizumab (OCREVUS) 300 MG/10ML injection, Inject 20 mLs (600 mg total) into the vein every 6 (six) months., Disp: 20 mL, Rfl: 1   PROAIR HFA 108 (90 Base) MCG/ACT inhaler, INHALE 2 PUFFS INTO THE LUNGS EVERY 4 HOURS AS NEEDED FOR WHEEZING OR SHORTNESS OF BREATH, Disp: 17 g, Rfl: 0   traZODone (DESYREL) 100 MG tablet, 1/2 to 1 pill qHS, Disp: 30 tablet, Rfl: 11   triamcinolone ointment (KENALOG) 0.5 %, Apply 1 Application topically 2 (two) times daily., Disp: 30 g, Rfl: 0  PAST MEDICAL HISTORY: Past Medical History:  Diagnosis Date   Depression    no meds currently   MS (multiple sclerosis) ([place])    SVD (spontaneous vaginal delivery)    x 5    PAST SURGICAL HISTORY: Past Surgical History:  Procedure Laterality Date   IR FLUORO GUIDE CV LINE RIGHT  07/16/2018   IR REMOVAL TUN CV CATH W/O FL  08/04/2018   IR US GUIDE VASC ACCESS RIGHT  07/16/2018   LAPAROSCOPIC TUBAL LIGATION Bilateral 01/06/2013   Procedure: LAPAROSCOPIC TUBAL LIGATION;  Surgeon: Frederico Hamman, MD;  Location: [facility];  Service: Gynecology;  Laterality: Bilateral;   right hand surgery      FAMILY HISTORY: Family History  Problem Relation Age of Onset   Diabetes Mother    Heart disease Mother    Diabetes Father    Multiple sclerosis Cousin     SOCIAL HISTORY:  Social History   Socioeconomic History   Marital status: Married    Spouse name: Not on file   Number of children: Not on file   Years of education: Not on file   Highest education level: Not on file  Occupational History   Not on file  Tobacco Use   Smoking status: Former    Packs/day: 0.10    Types: Cigarettes   Smokeless tobacco: Never   Substance and Sexual Activity   Alcohol use: No    Alcohol/week: 0.0 standard drinks of alcohol   Drug use: No   Sexual activity: Yes    Birth control/protection: None  Other Topics Concern   Not on file  Social History Narrative   Not on file   Social Determinants of Health   Financial Resource Strain: Not on file  Food Insecurity: Not on file  Transportation Needs: Not on file  Physical Activity: Not on file  Stress: Not on file  Social Connections: Not on file  Intimate Partner Violence: Not on file     PHYSICAL EXAM  There were no vitals filed for this visit.   There is no height or weight on file to calculate BMI.  No results found.    General: The patient is well-developed and well-nourished and in no acute distress.   She has no rashes.   She has left optic disc pallor    Neurologic Exam  Mental status: The patient is alert and oriented x 3 at the time of the examination. The patient has apparent normal recent and remote memory, with an apparently normal attention span and concentration ability.   Speech is normal.  Cranial nerves: Extraocular movements are full.  2+ left APD.   She is hand waving only OS and 20/20 OD.  Facial strength and sensation is normal. Trapezius and sternocleidomastoid strength is normal. No dysarthria is noted.  The tongue is midline, and the patient has symmetric elevation of the soft palate. No obvious hearing deficits are noted.  Motor:  Muscle bulk is normal.   Muscle tone is normal.  Strength was 5/5 in the arms and the right leg in the proximal left leg but 4+/5 in the left ankle and toe extensors  Sensory:   Vibration sensation and touch sensation is normal and symmetric in the legs.  Coordination: Finger-nose-finger is normal.  Decreased coordination of the legs, worse on the left.  Gait and station: Station is normal. Left foot drop.   Gait is slightly wide and tendem is wide but improved form last visit. .  Romberg is  negative.   Reflexes: Deep tendon reflexes are symmetric and normal bilaterally.           DIAGNOSTIC DATA (LABS, IMAGING, TESTING) - I reviewed patient records, labs, notes, testing and imaging myself where available.  Lab Results  Component Value Date   WBC 5.1 08/30/2021   HGB 11.6 08/30/2021   HCT 34.7 08/30/2021   MCV 85 08/30/2021   PLT 297 08/30/2021      Component Value Date/Time   NA 141 01/04/2020 1425   K 4.5 01/04/2020 1425   CL 106 01/04/2020 1425   CO2 23 01/04/2020 1425   GLUCOSE 76 01/04/2020 1425   GLUCOSE 101 (H) 05/04/2019 1900   BUN 9 01/04/2020 1425   CREATININE 0.79 01/04/2020 1425   CALCIUM 9.7 01/04/2020 1425   PROT 6.7 11/02/2020 1621   ALBUMIN 4.2 11/02/2020 1621   AST 16 11/02/2020 1621   ALT 20 11/02/2020 1621   ALKPHOS 72 11/02/2020 1621   BILITOT <0.2 11/02/2020 1621   GFRNONAA 98 01/04/2020 1425   GFRAA 113 01/04/2020 1425       ASSESSMENT AND PLAN    No diagnosis found.    1.   Continue Ocrevus 600 mg every 6 months.  We will recheck blood work.   She no-showed so I went over the importance and goals of DMT.   2.   Due to difficulty with gait, vision and cognition/fatigue, she is disabled. 3.   Etodolac 400 mg po bid for arthrotic pain and trazodone nightly for insomnia (50-100 mg)   .  She gets a benefit from Tylenol 3 and this will be continued for her chronic pain. Return in 6 months or sooner if there are new or worsening neurologic symptoms.      I spent *** minutes of face-to-face and non-face-to-face time with patient.  This included previsit chart review, lab review, study review, order entry, electronic health record documentation, patient education  Frann Rider, [facility]  [facility] [address] [place], [place] [address]  Phone [phone] Fax [phone] Note: This document was prepared with digital dictation and possible smart phrase technology. Any transcriptional  errors that result from this process are unintentional.

## 2022-10-23 ENCOUNTER — Ambulatory Visit: Payer: 59 | Admitting: Adult Health

## 2022-10-24 ENCOUNTER — Telehealth: Payer: Self-pay | Admitting: Adult Health

## 2022-10-24 ENCOUNTER — Ambulatory Visit (INDEPENDENT_AMBULATORY_CARE_PROVIDER_SITE_OTHER): Payer: 59 | Admitting: Adult Health

## 2022-10-24 ENCOUNTER — Encounter: Payer: Self-pay | Admitting: Adult Health

## 2022-10-24 VITALS — BP 110/83 | HR 78 | Ht 66.0 in | Wt 216.6 lb

## 2022-10-24 DIAGNOSIS — G35 Multiple sclerosis: Secondary | ICD-10-CM

## 2022-10-24 DIAGNOSIS — Z79899 Other long term (current) drug therapy: Secondary | ICD-10-CM

## 2022-10-24 MED ORDER — AMPHETAMINE-DEXTROAMPHETAMINE 10 MG PO TABS: 10.0000 mg | ORAL_TABLET | Freq: Two times a day (BID) | ORAL | 0 refills | Status: DC | PRN

## 2022-10-24 MED ORDER — TRAZODONE HCL 100 MG PO TABS: ORAL_TABLET | ORAL | 11 refills | Status: DC

## 2022-10-24 MED ORDER — ACETAMINOPHEN-CODEINE 300-30 MG PO TABS: 1.0000 | ORAL_TABLET | Freq: Three times a day (TID) | ORAL | 0 refills | Status: DC

## 2022-10-24 NOTE — Telephone Encounter (Signed)
[employer] medicare/[place] medicaid NPR sent to GI [phone] 

## 2022-10-24 NOTE — Patient Instructions (Signed)
Continue current treatment plan   Will send in refills for your Adderall, Tylenol 3 and trazodone   We will check lab work today - continue Ocrevus infusion   You will be called to schedule an MRI of your brain    Follow up with Dr. Felecia Shelling in 6 months or call earlier if needed

## 2022-10-24 NOTE — Progress Notes (Signed)
[facility]  PATIENT: Alicia Barker DOB: [birth year]  REFERRING DOCTOR OR PCP:  PCP is Gracy Racer SOURCE: patient, records from hospital, MRI / lab reports, MRI images on PACS  _________________________________  - HISTORICAL  CHIEF COMPLAINT:  Chief Complaint  Patient presents with   Follow-up    Patient in room #8 and alone. Patient states she is well and stable, no new concerns.    HISTORY OF PRESENT ILLNESS:  Alicia Barker is a 37 y.o. woman who was diagnosed with MS in March 2017.     Update 10/23/2022:  Prior visit with Dr. Felecia Shelling on 08/30/2021. She no showed prior scheduled visit with Dr. Felecia Shelling on 03/06/2022. She is on Ocrevus.  The last infusion was 04/2022, next infusion scheduled 10/30/2022.  Her last exacerbation with optic neuritis was in July 2020.  Last MRI 11/2020 stable compared to prior imaging without new lesions.  Her gait is mostly unchanged.  Her left leg is mildly worse than the right.  Sometimes she drags it - esp if she goes longer distance.  She has numbness and pins/needles tingling in hands and feet which is unchanged.   She reports light perception only vision out of the left eye.  She can read easily out of the right eye.  Due to urinary urge incontinence, she wears pads.    She has poor sleep most nights with both sleep maintenance and sleep onset insomnia.  Use of trazodone with benefit.  She has fatigue which is worse due to poor sleep.  She notes some depression.    She notes mild memory issues and diffciulty coming up with the right words at times. Notes difficulty focusing, currently using Adderall 10 mg twice daily which is helpful  She reports back and neck pain.  Hands hurt at times, esp with use.  Use of Tylenol #3 with benefit.        MS History:    In early March, 2017, she had the onset of slurred speech and leg weakness and ataxia.    A head CT was reportedly normal.    Over the next couple weeks, she had right  worse than left visual acuity issues, more numbness and more clumsiness.        Her gait became unsteady.     She also noted mid back pain.    She went to the ER and was diagnosed with an inner ear problem.   She went back to ER 11/15/15 and was found to have an abnormal MRI consistent with MS.    MRI of the spine also showed additional plaques.     She received 5 days of IV Steroids and noticed an improvement with improved gait but continued numbness.    I have reviewed the MRIs of the brain and spine performed for 12/30/2015 and 11/17/2015. The MRI of the brain with and without contrast shows multiple T2/FLAIR hyperintense foci, many in the periventricular white matter.    Another focus is the pons (with mild enhancement, better seen on c-spine MRI).Marland Kitchen 7 foci enhanced after gadolinium administration imply more recent MS plaques.    There are foci at C2 and C3 on cervical spine MRI and the focus at C2 had slight enhancement.    Another enhancing focus is noted within the thoracic spine adjacent to T3-T4.   She started Tecfidera after I first saw her 11/28/2015.   She had a significant relapse and was switched over to ocrelizumab if her first dose  around January or February 2018.    She had an exacerbation with severe left ON in 06/2018 and possibly mild right ON .02/2019.                                                                                     Imaging: MRI brain 06/02/2021 showed T2/flair hyperintense foci in the hemispheres, right middle cerebellar peduncle and right pons in a pattern and configuration consistent with chronic demyelinating plaque associated with multiple sclerosis.  None of the foci appear to be acute and they do not enhance.  Compared to the MRI dated 07/10/2018, there do not appear to be any new lesions.    REVIEW OF SYSTEMS: Constitutional: No fevers, chills, sweats, or change in appetite.  She reports fatigue and poor sleep  Eyes: mild right visual changes, double vision, eye  pain Ear, nose and throat: No hearing loss, ear pain, nasal congestion, sore throat Cardiovascular: No chest pain, palpitations Respiratory:  No shortness of breath at rest or with exertion.   No wheezes GastrointestinaI: No nausea, vomiting, diarrhea, abdominal pain, fecal incontinence Genitourinary:  No dysuria, urinary retention.   She has some urgency and frequency..  She has some nocturia. Musculoskeletal:  Reports neck pain, back pain Integumentary: No rash, pruritus, skin lesions Neurological: as above Psychiatric: Notes depression > anxiety.    Endocrine: No palpitations, diaphoresis, change in appetite, change in weigh or increased thirst Hematologic/Lymphatic:  No anemia, purpura, petechiae. Allergic/Immunologic: No itchy/runny eyes, nasal congestion, recent allergic reactions, rashes  ALLERGIES: Allergies  Allergen Reactions   Penicillins Anaphylaxis, Shortness Of Breath and Swelling    Has patient had a PCN reaction causing immediate rash, facial/tongue/throat swelling, SOB or lightheadedness with hypotension: YES Has patient had a PCN reaction causing severe rash involving mucus membranes or skin necrosis: NO Has patient had a PCN reaction that required hospitalization NO Has patient had a PCN reaction occurring within the last 10 years: NO If all of the above answers are "NO", then may proceed with Cephalosporin use.   Zoloft [Sertraline Hcl]     Jittery per pt    HOME MEDICATIONS:  Current Outpatient Medications:    acetaminophen-codeine (TYLENOL #3) 300-30 MG tablet, Take 1 tablet by mouth in the morning, at noon, and at bedtime., Disp: 90 tablet, Rfl: 1   benzonatate (TESSALON) 100 MG capsule, Take 1 capsule (100 mg total) by mouth every 8 (eight) hours., Disp: 21 capsule, Rfl: 0   escitalopram (LEXAPRO) 20 MG tablet, Take 1 tablet (20 mg total) by mouth daily., Disp: 90 tablet, Rfl: 0   fluticasone (FLONASE) 50 MCG/ACT nasal spray, Place 2 sprays into both nostrils  daily., Disp: 16 g, Rfl: 6   gabapentin (NEURONTIN) 300 MG capsule, Take 1 capsule (300 mg total) by mouth 2 (two) times daily., Disp: 30 capsule, Rfl: 0   lidocaine (XYLOCAINE) 5 % ointment, Apply 1 Application topically as needed., Disp: 35.44 g, Rfl: 0   meclizine (ANTIVERT) 25 MG tablet, Take 1 tablet (25 mg total) by mouth 3 (three) times daily as needed for dizziness., Disp: 30 tablet, Rfl: 0   ocrelizumab (OCREVUS) 300 MG/10ML injection, Inject 20 mLs (600  mg total) into the vein every 6 (six) months., Disp: 20 mL, Rfl: 1   PROAIR HFA 108 (90 Base) MCG/ACT inhaler, INHALE 2 PUFFS INTO THE LUNGS EVERY 4 HOURS AS NEEDED FOR WHEEZING OR SHORTNESS OF BREATH, Disp: 17 g, Rfl: 0   triamcinolone ointment (KENALOG) 0.5 %, Apply 1 Application topically 2 (two) times daily., Disp: 30 g, Rfl: 0   amitriptyline (ELAVIL) 25 MG tablet, Take 1 tablet (25 mg total) by mouth at bedtime for 21 days., Disp: 21 tablet, Rfl: 0   traZODone (DESYREL) 100 MG tablet, 1/2 to 1 pill qHS, Disp: 30 tablet, Rfl: 11  PAST MEDICAL HISTORY: Past Medical History:  Diagnosis Date   Depression    no meds currently   MS (multiple sclerosis) ([place])    SVD (spontaneous vaginal delivery)    x 5    PAST SURGICAL HISTORY: Past Surgical History:  Procedure Laterality Date   IR FLUORO GUIDE CV LINE RIGHT  07/16/2018   IR REMOVAL TUN CV CATH W/O FL  08/04/2018   IR US GUIDE VASC ACCESS RIGHT  07/16/2018   LAPAROSCOPIC TUBAL LIGATION Bilateral 01/06/2013   Procedure: LAPAROSCOPIC TUBAL LIGATION;  Surgeon: Frederico Hamman, MD;  Location: [facility];  Service: Gynecology;  Laterality: Bilateral;   right hand surgery      FAMILY HISTORY: Family History  Problem Relation Age of Onset   Diabetes Mother    Heart disease Mother    Diabetes Father    Multiple sclerosis Cousin     SOCIAL HISTORY:  Social History   Socioeconomic History   Marital status: Married    Spouse name: Not on file   Number of children: Not on file    Years of education: Not on file   Highest education level: Not on file  Occupational History   Not on file  Tobacco Use   Smoking status: Former    Packs/day: .1    Types: Cigarettes   Smokeless tobacco: Never  Substance and Sexual Activity   Alcohol use: No    Alcohol/week: 0.0 standard drinks of alcohol   Drug use: No   Sexual activity: Yes    Birth control/protection: None  Other Topics Concern   Not on file  Social History Narrative   Not on file   Social Determinants of Health   Financial Resource Strain: Not on file  Food Insecurity: Not on file  Transportation Needs: Not on file  Physical Activity: Not on file  Stress: Not on file  Social Connections: Not on file  Intimate Partner Violence: Not on file     PHYSICAL EXAM  Vitals:   10/24/22 1320  BP: 110/83  Pulse: 78  Weight: 216 lb 9.6 oz (98.2 kg)  Height: '5\' 6"'$  (1.676 m)   Body mass index is 34.96 kg/m.  No results found.  General: The patient is well-developed and well-nourished and in no acute distress.   She has no rashes.   She has left optic disc pallor    Neurologic Exam  Mental status: The patient is alert and oriented x 3 at the time of the examination. The patient has apparent normal recent and remote memory, with an apparently normal attention span and concentration ability.   Speech is normal.  Cranial nerves: Extraocular movements are full.  Facial strength and sensation is normal. Trapezius and sternocleidomastoid strength is normal. No dysarthria is noted.  The tongue is midline, and the patient has symmetric elevation of the soft palate. No obvious  hearing deficits are noted.  Motor:  Muscle bulk is normal.   Muscle tone is normal.  Strength was 5/5 in the arms and the right leg in the proximal left leg but 4+/5 in the left ankle and toe extensors  Sensory:   Vibration sensation and touch sensation is normal and symmetric in the legs.  Coordination: Finger-nose-finger is normal.   Decreased coordination of the legs, worse on the left.  Gait and station: Station is normal. Left foot drop.   Gait is slightly wide and tendem is wide.  Romberg is negative.   Reflexes: Deep tendon reflexes are symmetric and normal bilaterally.           DIAGNOSTIC DATA (LABS, IMAGING, TESTING) - I reviewed patient records, labs, notes, testing and imaging myself where available.  Lab Results  Component Value Date   WBC 5.1 08/30/2021   HGB 11.6 08/30/2021   HCT 34.7 08/30/2021   MCV 85 08/30/2021   PLT 297 08/30/2021      Component Value Date/Time   NA 141 01/04/2020 1425   K 4.5 01/04/2020 1425   CL 106 01/04/2020 1425   CO2 23 01/04/2020 1425   GLUCOSE 76 01/04/2020 1425   GLUCOSE 101 (H) 05/04/2019 1900   BUN 9 01/04/2020 1425   CREATININE 0.79 01/04/2020 1425   CALCIUM 9.7 01/04/2020 1425   PROT 6.7 11/02/2020 1621   ALBUMIN 4.2 11/02/2020 1621   AST 16 11/02/2020 1621   ALT 20 11/02/2020 1621   ALKPHOS 72 11/02/2020 1621   BILITOT <0.2 11/02/2020 1621   GFRNONAA 98 01/04/2020 1425   GFRAA 113 01/04/2020 1425       ASSESSMENT AND PLAN    1. Multiple sclerosis ([place])   2. High risk medication use      1.   Continue Ocrevus 600 mg every 6 months.  We will recheck blood work.  Will repeat MRI brain to look for any subclinical lesions since prior imaging in 2022.  2.   Due to difficulty with gait, vision and cognition/fatigue, she is disabled. 3.  Continue Adderall 10 mg twice daily for focus - refill provided - last filled 11/06/2020 4. Continue Trazodone for sleep -refill provided 5. Continue Tylenol #3 for chronic pain -refill provided - last filled 04/11/2022   Return in 6 months or sooner if there are new or worsening neurologic symptoms.     I spent 22 minutes of face-to-face and non-face-to-face time with patient.  This included previsit chart review, lab review, study review, order entry, electronic health record documentation, patient education  and discussion regarding above diagnoses and treatment plan and answered all other questions to patients satisfaction   Frann Rider, [facility]  [facility] [address] [place], [place] [address]  Phone [phone] Fax [phone] Note: This document was prepared with digital dictation and possible smart phrase technology. Any transcriptional errors that result from this process are unintentional.

## 2022-10-25 ENCOUNTER — Other Ambulatory Visit: Payer: Self-pay | Admitting: Adult Health

## 2022-10-25 ENCOUNTER — Other Ambulatory Visit: Payer: Self-pay | Admitting: Psychiatry

## 2022-10-25 LAB — CBC WITH DIFFERENTIAL/PLATELET
Basophils Absolute: 0 10*3/uL (ref 0.0–0.2)
Basos: 1 %
EOS (ABSOLUTE): 0.3 10*3/uL (ref 0.0–0.4)
Eos: 5 %
Hematocrit: 32.7 % — ABNORMAL LOW (ref 34.0–46.6)
Hemoglobin: 10.7 g/dL — ABNORMAL LOW (ref 11.1–15.9)
Immature Grans (Abs): 0.1 10*3/uL (ref 0.0–0.1)
Immature Granulocytes: 2 %
Lymphocytes Absolute: 1.6 10*3/uL (ref 0.7–3.1)
Lymphs: 28 %
MCH: 27.6 pg (ref 26.6–33.0)
MCHC: 32.7 g/dL (ref 31.5–35.7)
MCV: 85 fL (ref 79–97)
Monocytes Absolute: 0.4 10*3/uL (ref 0.1–0.9)
Monocytes: 8 %
Neutrophils Absolute: 3.3 10*3/uL (ref 1.4–7.0)
Neutrophils: 56 %
Platelets: 342 10*3/uL (ref 150–450)
RBC: 3.87 x10E6/uL (ref 3.77–5.28)
RDW: 14.5 % (ref 11.7–15.4)
WBC: 5.7 10*3/uL (ref 3.4–10.8)

## 2022-10-25 LAB — IGG, IGA, IGM
IgA/Immunoglobulin A, Serum: 165 mg/dL (ref 87–352)
IgG (Immunoglobin G), Serum: 1078 mg/dL (ref 586–1602)
IgM (Immunoglobulin M), Srm: 33 mg/dL (ref 26–217)

## 2022-10-25 MED ORDER — AMPHETAMINE-DEXTROAMPHETAMINE 10 MG PO TABS: 10.0000 mg | ORAL_TABLET | Freq: Two times a day (BID) | ORAL | 0 refills | Status: DC | PRN

## 2022-10-25 NOTE — Telephone Encounter (Signed)
Pt said pharmacy in [place] do not have amphetamine-dextroamphetamine (ADDERALL) 10 MG tablet and  acetaminophen-codeine (TYLENOL #3) 300-30 MG tablet. Would like to have refill resent to  [place], [place] [address] Phone: [phone]

## 2022-10-28 MED ORDER — ACETAMINOPHEN-CODEINE 300-30 MG PO TABS: 1.0000 | ORAL_TABLET | Freq: Three times a day (TID) | ORAL | 0 refills | Status: DC

## 2022-10-28 NOTE — Telephone Encounter (Signed)
Dr Billey Gosling filled the Adderall on 10/25/2022 according to drug registry. I will send the refill request for the pain medication to Dr Felecia Shelling to send in for the patient

## 2022-10-29 ENCOUNTER — Telehealth: Payer: Self-pay | Admitting: Adult Health

## 2022-10-29 NOTE — Telephone Encounter (Signed)
Pt called stating that the pharmacy informed her that they are still waiting on a answer for the pt to get her  acetaminophen-codeine (TYLENOL #3) 300-30 MG tablet  Please advise.

## 2022-10-29 NOTE — Telephone Encounter (Signed)
Please advise pt Rx was sent already sent yesterday.  90 tablets sent to [place], [place] [address] AT [facility] Receipt confirmed by pharmacy (10/28/2022  3:41 PM EDT)

## 2022-10-30 NOTE — Telephone Encounter (Signed)
Pt is wanting to know if this medication has a PA already. Please advise.

## 2022-10-30 NOTE — Telephone Encounter (Signed)
Yes, the PA was approved she can contact pharmacy for filling.

## 2022-10-30 NOTE — Telephone Encounter (Signed)
Called pt and informed her. Pt verbalized appreciation.

## 2022-10-30 NOTE — Telephone Encounter (Addendum)
PA was submitted Key: [id number]  [facility] is reviewing your PA request, awaiting determination. Request Reference Number: [id number]. APAP/CODEINE TAB 300-30MG  is approved through 11/29/2022

## 2022-11-04 ENCOUNTER — Telehealth: Payer: Self-pay | Admitting: Student

## 2022-11-04 NOTE — Telephone Encounter (Signed)
Contacted Alicia Barker to schedule their annual wellness visit. Appointment made for 11/12/2022.  Thank you,  [place] Direct dial  [phone]

## 2022-11-11 NOTE — Progress Notes (Unsigned)
I connected with  Alicia Barker on 11/12/2022 by a audio enabled telemedicine application and verified that I am speaking with the correct person using two identifiers.  Patient Location: Home  Provider Location: Home Office  I discussed the limitations of evaluation and management by telemedicine. The patient expressed understanding and agreed to proceed.   Subjective:   Alicia Barker is a 37 y.o. female who presents for an Initial Medicare Annual Wellness Visit.  Review of Systems    Per HPI unless specifically indicated below.        Objective:       10/24/2022    1:20 PM 06/20/2022    2:23 PM 05/23/2022    1:52 PM  Vitals with BMI  Height 5\' 6"   5\' 6"   Weight 216 lbs 10 oz 222 lbs 6 oz 227 lbs 10 oz  BMI 34.98 [id number]  Systolic A999333 [id number]  Diastolic 83 77 77  Pulse 78 96 76    Today's Vitals   11/12/22 1108  PainSc: 8    There is no height or weight on file to calculate BMI.     11/12/2022   11:22 AM 05/23/2022    1:51 PM 03/26/2021   10:22 AM 10/09/2020    3:57 PM 05/10/2019    8:56 AM 05/04/2019    3:55 PM 07/10/2018    4:37 PM  Advanced Directives  Does Patient Have a Medical Advance Directive? No No No No No No   Would patient like information on creating a medical advance directive? No - Patient declined No - Patient declined No - Patient declined No - Patient declined No - Patient declined No - Patient declined No - Patient declined    Current Medications (verified) Outpatient Encounter Medications as of 11/12/2022  Medication Sig   acetaminophen-codeine (TYLENOL #3) 300-30 MG tablet Take 1 tablet by mouth in the morning, at noon, and at bedtime.   amphetamine-dextroamphetamine (ADDERALL) 10 MG tablet Take 1 tablet (10 mg total) by mouth 2 (two) times daily as needed.   escitalopram (LEXAPRO) 20 MG tablet Take 1 tablet (20 mg total) by mouth daily.   fluticasone (FLONASE) 50 MCG/ACT nasal spray Place 2 sprays into both nostrils daily.    gabapentin (NEURONTIN) 300 MG capsule Take 1 capsule (300 mg total) by mouth 2 (two) times daily.   meclizine (ANTIVERT) 25 MG tablet Take 1 tablet (25 mg total) by mouth 3 (three) times daily as needed for dizziness.   ocrelizumab (OCREVUS) 300 MG/10ML injection Inject 20 mLs (600 mg total) into the vein every 6 (six) months.   PROAIR HFA 108 (90 Base) MCG/ACT inhaler INHALE 2 PUFFS INTO THE LUNGS EVERY 4 HOURS AS NEEDED FOR WHEEZING OR SHORTNESS OF BREATH   traZODone (DESYREL) 100 MG tablet 1/2 to 1 pill qHS   amitriptyline (ELAVIL) 25 MG tablet Take 1 tablet (25 mg total) by mouth at bedtime for 21 days.   benzonatate (TESSALON) 100 MG capsule Take 1 capsule (100 mg total) by mouth every 8 (eight) hours. (Patient not taking: Reported on 11/12/2022)   triamcinolone ointment (KENALOG) 0.5 % Apply 1 Application topically 2 (two) times daily.   [DISCONTINUED] lidocaine (XYLOCAINE) 5 % ointment Apply 1 Application topically as needed. (Patient not taking: Reported on 11/12/2022)   No facility-administered encounter medications on file as of 11/12/2022.    Allergies (verified) Penicillins and Zoloft [sertraline hcl]   History: Past Medical History:  Diagnosis Date   Depression  no meds currently   MS (multiple sclerosis)    SVD (spontaneous vaginal delivery)    x 5   Past Surgical History:  Procedure Laterality Date   IR FLUORO GUIDE CV LINE RIGHT  07/16/2018   IR REMOVAL TUN CV CATH W/O FL  08/04/2018   IR US GUIDE VASC ACCESS RIGHT  07/16/2018   LAPAROSCOPIC TUBAL LIGATION Bilateral 01/06/2013   Procedure: LAPAROSCOPIC TUBAL LIGATION;  Surgeon: Frederico Hamman, MD;  Location: [facility];  Service: Gynecology;  Laterality: Bilateral;   right hand surgery     Family History  Problem Relation Age of Onset   Diabetes Mother    Heart disease Mother    Diabetes Father    Multiple sclerosis Cousin    Social History   Socioeconomic History   Marital status: Married    Spouse name: Not on  file   Number of children: 5   Years of education: Not on file   Highest education level: Not on file  Occupational History   Occupation: Disability  Tobacco Use   Smoking status: Former    Packs/day: .1    Types: Cigarettes   Smokeless tobacco: Never  Vaping Use   Vaping Use: Never used  Substance and Sexual Activity   Alcohol use: No    Alcohol/week: 0.0 standard drinks of alcohol   Drug use: No   Sexual activity: Yes    Birth control/protection: None  Other Topics Concern   Not on file  Social History Narrative   Not on file   Social Determinants of Health   Financial Resource Strain: High Risk (11/12/2022)   Overall Financial Resource Strain (CARDIA)    Difficulty of Paying Living Expenses: Hard  Food Insecurity: No Food Insecurity (11/12/2022)   Hunger Vital Sign    Worried About Running Out of Food in the Last Year: Never true    [place] in the Last Year: Never true  Transportation Needs: No Transportation Needs (11/12/2022)   PRAPARE - Hydrologist (Medical): No    Lack of Transportation (Non-Medical): No  Physical Activity: Inactive (11/12/2022)   Exercise Vital Sign    Days of Exercise per Week: 0 days    Minutes of Exercise per Session: 0 min  Stress: Stress Concern Present (11/12/2022)   [place]    Feeling of Stress : Very much  Social Connections: Socially Integrated (11/12/2022)   Social Connection and Isolation Panel [NHANES]    Frequency of Communication with Friends and Family: More than three times a week    Frequency of Social Gatherings with Friends and Family: More than three times a week    Attends Religious Services: More than 4 times per year    Active Member of [employer] or Organizations: Yes    Attends Archivist Meetings: Never    Marital Status: Married    Tobacco Counseling Counseling given: No   Clinical Intake:  Pre-visit  preparation completed: No  Pain : 0-10 Pain Score: 8  Pain Type: Chronic pain Pain Location: Hip Pain Orientation: Right Pain Descriptors / Indicators: Aching, Jabbing Pain Frequency: Constant     Nutritional Status: BMI > 30  Obese Nutritional Risks: Unintentional weight gain Diabetes: No  How often do you need to have someone help you when you read instructions, pamphlets, or other written materials from your doctor or pharmacy?: 1 - Never  Diabetic?No     Information  entered by :: Donnie Mesa, CMA   Activities of Daily Living    11/12/2022   11:06 AM  In your present state of health, do you have any difficulty performing the following activities:  Hearing? 1  Vision? [place]  Difficulty concentrating or making decisions? 1  Walking or climbing stairs? 1  Dressing or bathing? 0  Doing errands, shopping? 0    Patient Care Team: Darci Current, DO as PCP - General (Family Medicine)  Indicate any recent Medical Services you may have received from other than [facility] providers in the past year (date may be approximate).     Assessment:   This is a routine wellness examination for Deriana.   Hearing/Vision screen Admits to some hearing loss.  Light perception only vision in the Left eye. Scheduled for an [place].   Dietary issues and exercise activities discussed: Current Exercise Habits: The patient does not participate in regular exercise at present, Exercise limited by: orthopedic condition(s);Other - see comments (MS)   Goals Addressed   None    Depression Screen    11/12/2022   11:05 AM 05/23/2022    1:51 PM 03/26/2021   10:24 AM 10/09/2020    3:56 PM 06/11/2019    2:00 PM 05/10/2019    8:56 AM 05/04/2019    3:55 PM  PHQ 2/9 Scores  PHQ - 2 Score 1 2 6 4  0 0 0  PHQ- 9 Score  15 24 22        Fall Risk    11/12/2022   11:05 AM 05/10/2019    8:56 AM 05/04/2019    3:55 PM 07/25/2017   10:04 AM  12/23/2016   10:40 AM  Fall Risk   Falls in the past year? 1 0 0 Yes No  Number falls in past yr: 1 0 0    Injury with Fall? 1   No   Risk for fall due to : Impaired balance/gait      Follow up Falls evaluation completed        FALL RISK PREVENTION PERTAINING TO THE HOME:  Any stairs in or around the home? No  If so, are there any without handrails? Yes  Home free of loose throw rugs in walkways, pet beds, electrical cords, etc? Yes  Adequate lighting in your home to reduce risk of falls? Yes   ASSISTIVE DEVICES UTILIZED TO PREVENT FALLS:  Life alert? No  Use of a cane, walker or w/c? Yes  Grab bars in the bathroom? Yes  Shower chair or bench in shower? No  Elevated toilet seat or a handicapped toilet? Yes   TIMED UP AND GO:  Was the test performed?Unable to perform, virtual appointment   Cognitive Function:        11/12/2022   11:12 AM  6CIT Screen  What Year? 0 points  What month? 0 points  What time? 0 points  Count back from 20 0 points  Months in reverse 0 points  Repeat phrase 10 points  Total Score 10 points    Immunizations Immunization History  Administered Date(s) Administered   Influenza,inj,Quad PF,6+ Mos 07/25/2017, 05/10/2019, 10/09/2020   PFIZER Comirnaty(Gray Top)Covid-19 Tri-Sucrose Vaccine 07/25/2020   Tdap 07/25/2017    TDAP status: Up to date  Flu Vaccine status: Up to date  Pneumococcal vaccine status: Up to date  Covid-19 vaccine status: Information provided on how to obtain vaccines.   Qualifies for Shingles Vaccine:  No  Screening Tests Health Maintenance  Topic Date Due   COVID-19 Vaccine (2 - Pfizer risk series) 08/15/2020   INFLUENZA VACCINE  03/13/2023   Medicare Annual Wellness (AWV)  11/12/2023   PAP SMEAR-Modifier  06/20/2025   DTaP/Tdap/Td (2 - Td or Tdap) 07/26/2027   Hepatitis C Screening  Completed   HIV Screening  Completed   HPV VACCINES  Aged Out    Health Maintenance  Health Maintenance Due  Topic Date  Due   COVID-19 Vaccine (2 - Pfizer risk series) 08/15/2020    Colorectal Cancer Screening, Mammogram, and DEXA Scan: not applicable       Lung Cancer Screening: (Low Dose CT Chest recommended if Age 34-80 years, 30 pack-year currently smoking OR have quit w/in 15years.) does not qualify.   Lung Cancer Screening Referral: not applicable  Additional Screening:  Hepatitis C Screening: does qualify; Completed 02/16/2011  Vision Screening: Recommended annual ophthalmology exams for early detection of glaucoma and other disorders of the eye. Is the patient up to date with their annual eye exam?  No  Who is the provider or what is the name of the office in which the patient attends annual eye exams? [place] If pt is not established with a provider, would they like to be referred to a provider to establish care? No .   Dental Screening: Recommended annual dental exams for proper oral hygiene  Community Resource Referral / Chronic Care Management: CRR required this visit?  No   CCM required this visit?  No      Plan:     I have personally reviewed and noted the following in the patient's chart:   Medical and social history Use of alcohol, tobacco or illicit drugs  Current medications and supplements including opioid prescriptions. Patient is not currently taking opioid prescriptions. Functional ability and status Nutritional status Physical activity Advanced directives List of other physicians Hospitalizations, surgeries, and ER visits in previous 12 months Vitals Screenings to include cognitive, depression, and falls Referrals and appointments  In addition, I have reviewed and discussed with patient certain preventive protocols, quality metrics, and best practice recommendations. A written personalized care plan for preventive services as well as general preventive health recommendations were provided to patient.    Ms. Oglesby , Thank you for taking time to come  for your Medicare Wellness Visit. I appreciate your ongoing commitment to your health goals. Please review the following plan we discussed and let me know if I can assist you in the future.   These are the goals we discussed:  Goals   None     This is a list of the screening recommended for you and due dates:  Health Maintenance  Topic Date Due   COVID-19 Vaccine (2 - Pfizer risk series) 08/15/2020   Flu Shot  03/13/2023   Medicare Annual Wellness Visit  11/12/2023   Pap Smear  06/20/2025   DTaP/Tdap/Td vaccine (2 - Td or Tdap) 07/26/2027   Hepatitis C Screening: USPSTF Recommendation to screen - Ages 54-79 yo.  Completed   HIV Screening  Completed   HPV Vaccine  Aged [address] [place]   11/12/2022  Nurse Notes: Approximately 30 minute Non-Face -To-Face Medicare Wellness Visit

## 2022-11-11 NOTE — Patient Instructions (Signed)

## 2022-11-12 ENCOUNTER — Ambulatory Visit (INDEPENDENT_AMBULATORY_CARE_PROVIDER_SITE_OTHER): Payer: 59

## 2022-11-12 DIAGNOSIS — Z0000 Encounter for general adult medical examination without abnormal findings: Secondary | ICD-10-CM

## 2022-11-20 ENCOUNTER — Ambulatory Visit: Admission: RE | Admit: 2022-11-20 | Payer: Medicare Other | Source: Ambulatory Visit

## 2022-12-04 ENCOUNTER — Encounter: Payer: Self-pay | Admitting: *Deleted

## 2023-01-10 ENCOUNTER — Telehealth: Payer: Self-pay | Admitting: Neurology

## 2023-01-10 NOTE — Telephone Encounter (Signed)
Pt stated that prescription for acetaminophen-codeine (TYLENOL #3) 300-30 MG tablet was sent to wrong pharmacy. Pt asking if prescription can be sent to [facility] [phone]

## 2023-01-13 MED ORDER — ACETAMINOPHEN-CODEINE 300-30 MG PO TABS: 1.0000 | ORAL_TABLET | Freq: Three times a day (TID) | ORAL | 0 refills | Status: DC

## 2023-01-13 NOTE — Telephone Encounter (Signed)
I do not see where a recent refill was sent in for Tylenol #3 by our office.  Pt last seen 10/24/22 and next f/u 05/08/23. Per drug registry, last refilled 10/31/22 #90.

## 2023-02-02 ENCOUNTER — Other Ambulatory Visit: Payer: Self-pay | Admitting: Student

## 2023-02-02 DIAGNOSIS — B028 Zoster with other complications: Secondary | ICD-10-CM

## 2023-02-03 ENCOUNTER — Other Ambulatory Visit: Payer: Self-pay | Admitting: Family Medicine

## 2023-02-03 ENCOUNTER — Other Ambulatory Visit: Payer: Self-pay | Admitting: Nurse Practitioner

## 2023-02-03 DIAGNOSIS — B029 Zoster without complications: Secondary | ICD-10-CM

## 2023-02-03 DIAGNOSIS — L299 Pruritus, unspecified: Secondary | ICD-10-CM

## 2023-02-03 NOTE — Telephone Encounter (Signed)
Pharmacy refill requested for Amitriptyline. Originally prescribed for shingles rash. Pt reports her rash is gone and that she no longer requires this medication. Refused refill and removed from med list.   Glendale Chard, DO [facility], PGY-1 02/03/23 8:22 AM

## 2023-02-05 ENCOUNTER — Telehealth: Payer: Self-pay | Admitting: *Deleted

## 2023-02-05 NOTE — Telephone Encounter (Signed)
Called pt. Advised we spoke w/ infusion suite who told us she has transportation issues and hard for her to get to our office for Ocrevus infusions. Pt agreeable to see if we can get home infusion approved through [employer]. Aware we need her updated insurance card first. She will upload front/back to mychart and send message once complete.

## 2023-02-11 ENCOUNTER — Other Ambulatory Visit: Payer: 59

## 2023-03-17 ENCOUNTER — Other Ambulatory Visit: Payer: Self-pay | Admitting: Neurology

## 2023-03-18 NOTE — Telephone Encounter (Signed)
Last seen on 10/24/22 Follow up scheduled on 05/08/23 Last filled on 02/26/23 #90 tablets (30 day supply) Rx pending to be signed

## 2023-03-19 ENCOUNTER — Other Ambulatory Visit: Payer: Self-pay | Admitting: Adult Health

## 2023-03-19 NOTE — Telephone Encounter (Signed)
Pt called and stated that her pharmacy informed her that she is needing a new Rx for her acetaminophen-codeine (TYLENOL #3) 300-30 MG tablet sent in to them . Please advise.

## 2023-03-22 ENCOUNTER — Other Ambulatory Visit: Payer: 59

## 2023-05-08 ENCOUNTER — Encounter: Payer: Self-pay | Admitting: Neurology

## 2023-05-08 ENCOUNTER — Ambulatory Visit: Payer: 59 | Admitting: Neurology

## 2023-08-20 ENCOUNTER — Other Ambulatory Visit: Payer: Self-pay | Admitting: Student

## 2023-08-20 DIAGNOSIS — B028 Zoster with other complications: Secondary | ICD-10-CM

## 2023-09-03 ENCOUNTER — Encounter: Payer: Self-pay | Admitting: Adult Health

## 2023-09-03 NOTE — Telephone Encounter (Signed)
Appointment scheduled.

## 2023-09-11 ENCOUNTER — Telehealth: Payer: Self-pay | Admitting: *Deleted

## 2023-09-11 ENCOUNTER — Encounter: Payer: Self-pay | Admitting: Neurology

## 2023-09-11 ENCOUNTER — Ambulatory Visit (INDEPENDENT_AMBULATORY_CARE_PROVIDER_SITE_OTHER): Payer: 59 | Admitting: Neurology

## 2023-09-11 VITALS — BP 113/80 | HR 80 | Ht 67.0 in | Wt 220.5 lb

## 2023-09-11 DIAGNOSIS — G4700 Insomnia, unspecified: Secondary | ICD-10-CM

## 2023-09-11 DIAGNOSIS — Z79899 Other long term (current) drug therapy: Secondary | ICD-10-CM | POA: Diagnosis not present

## 2023-09-11 DIAGNOSIS — G35 Multiple sclerosis: Secondary | ICD-10-CM

## 2023-09-11 DIAGNOSIS — R269 Unspecified abnormalities of gait and mobility: Secondary | ICD-10-CM

## 2023-09-11 DIAGNOSIS — F32A Depression, unspecified: Secondary | ICD-10-CM | POA: Diagnosis not present

## 2023-09-11 DIAGNOSIS — H469 Unspecified optic neuritis: Secondary | ICD-10-CM | POA: Diagnosis not present

## 2023-09-11 MED ORDER — ACETAMINOPHEN-CODEINE 300-30 MG PO TABS: 1.0000 | ORAL_TABLET | Freq: Three times a day (TID) | ORAL | 5 refills | Status: DC

## 2023-09-11 MED ORDER — AMPHETAMINE-DEXTROAMPHETAMINE 10 MG PO TABS: 10.0000 mg | ORAL_TABLET | Freq: Two times a day (BID) | ORAL | 0 refills | Status: DC | PRN

## 2023-09-11 MED ORDER — ALBUTEROL SULFATE HFA 108 (90 BASE) MCG/ACT IN AERS: 2.0000 | INHALATION_SPRAY | RESPIRATORY_TRACT | 0 refills | Status: AC | PRN

## 2023-09-11 MED ORDER — ESCITALOPRAM OXALATE 20 MG PO TABS: 20.0000 mg | ORAL_TABLET | Freq: Every day | ORAL | 0 refills | Status: DC

## 2023-09-11 NOTE — Progress Notes (Signed)
[facility]  PATIENT: Alicia Barker DOB: 1986/02/18  REFERRING DOCTOR OR PCP:  PCP is Francoise Ceo SOURCE: patient, records from hospital, MRI / lab reports, MRI images on PACS  _________________________________  - HISTORICAL  CHIEF COMPLAINT:  Chief Complaint  Patient presents with   Room 10    Pt is here Alone. Pt states she doesn't have vision in her left eye. Pt states that her balance is off. Pt states that she is still having pins and needle feeling. Pt states that she has a lot of fatigue. Pt states that she would like to discuss about getting a cane for balance issues. Pt is requesting refill on Adderall and Meclizine.     HISTORY OF PRESENT ILLNESS:  Alicia Barker is a 38 y.o. woman who was diagnosed with MS in March 2017.     Update 09/11/2023  She is on Ocrevus and her last infusion was in April.  The last infusion was 12/18/2020 (300 mg) and she no-showed the rescheduled 5/24 and 5/31 visits.     Her last exacerbation with optic neuritis was in July 2020.    Her gait is stable but her left leg gives her problems.  Sometimes she drags it - esp if she goes longer distance.  She has numbness and pins/needles tingling in hands and feet.   She uses the bannister on stairs.      She reports light perception/large moving shape only vision out of the left eye.  No color vision OS.  She can read easily out of the right eye.  Due to urinary urge incontinence, she wears pads.    She has poor sleep most nights with both sleep maintenance and sleep onset insomnia.  Trazodone help sleep maintenance more than onset.    She has fatigue.    She notes some depression.    She notes mild memory issues and diffciulty coming up with the right words at times    Lexapr ohas helped anxiety > depression  She reports back and neck pain.  Hands hurt at times, esp with use.      MS History:    In early March, 2017, she had the onset of slurred speech and leg weakness  and ataxia.    A head CT was reportedly normal.    Over the next couple weeks, she had right worse than left visual acuity issues, more numbness and more clumsiness.        Her gait became unsteady.     She also noted mid back pain.    She went to the ER and was diagnosed with an inner ear problem.   She went back to ER 11/15/15 and was found to have an abnormal MRI consistent with MS.    MRI of the spine also showed additional plaques.     She received 5 days of IV Steroids and noticed an improvement with improved gait but continued numbness.    I have reviewed the MRIs of the brain and spine performed for 12/30/2015 and 11/17/2015. The MRI of the brain with and without contrast shows multiple T2/FLAIR hyperintense foci, many in the periventricular white matter.    Another focus is the pons (with mild enhancement, better seen on c-spine MRI).Marland Kitchen 7 foci enhanced after gadolinium administration imply more recent MS plaques.    There are foci at C2 and C3 on cervical spine MRI and the focus at C2 had slight enhancement.    Another enhancing focus is  noted within the thoracic spine adjacent to T3-T4.   She started Tecfidera after I first saw her 11/28/2015.   She had a significant relapse and was switched over to ocrelizumab if her first dose around January or February 2018.    She had an exacerbation with severe left ON in 06/2018 and possibly mild right ON .02/2019.                                                                                     Imaging: MRI brain 06/02/2021 showed T2/flair hyperintense foci in the hemispheres, right middle cerebellar peduncle and right pons in a pattern and configuration consistent with chronic demyelinating plaque associated with multiple sclerosis.  None of the foci appear to be acute and they do not enhance.  Compared to the MRI dated 07/10/2018, there do not appear to be any new lesions.    REVIEW OF SYSTEMS: Constitutional: No fevers, chills, sweats, or change in  appetite.  She reports fatigue and poor sleep  Eyes: mild right visual changes, double vision, eye pain Ear, nose and throat: No hearing loss, ear pain, nasal congestion, sore throat Cardiovascular: No chest pain, palpitations Respiratory:  No shortness of breath at rest or with exertion.   No wheezes GastrointestinaI: No nausea, vomiting, diarrhea, abdominal pain, fecal incontinence Genitourinary:  No dysuria, urinary retention.   She has some urgency and frequency..  She has some nocturia. Musculoskeletal:  Reports neck pain, back pain Integumentary: No rash, pruritus, skin lesions Neurological: as above Psychiatric: Notes depression > anxiety.    Endocrine: No palpitations, diaphoresis, change in appetite, change in weigh or increased thirst Hematologic/Lymphatic:  No anemia, purpura, petechiae. Allergic/Immunologic: No itchy/runny eyes, nasal congestion, recent allergic reactions, rashes  ALLERGIES: Allergies  Allergen Reactions   Penicillins Anaphylaxis, Shortness Of Breath and Swelling    Has patient had a PCN reaction causing immediate rash, facial/tongue/throat swelling, SOB or lightheadedness with hypotension: YES Has patient had a PCN reaction causing severe rash involving mucus membranes or skin necrosis: NO Has patient had a PCN reaction that required hospitalization NO Has patient had a PCN reaction occurring within the last 10 years: NO If all of the above answers are "NO", then may proceed with Cephalosporin use.   Zoloft [Sertraline Hcl]     Jittery per pt    HOME MEDICATIONS:  Current Outpatient Medications:    fluticasone (FLONASE) 50 MCG/ACT nasal spray, Place 2 sprays into both nostrils daily., Disp: 16 g, Rfl: 6   gabapentin (NEURONTIN) 300 MG capsule, Take 1 capsule (300 mg total) by mouth 2 (two) times daily., Disp: 30 capsule, Rfl: 0   meclizine (ANTIVERT) 25 MG tablet, Take 1 tablet (25 mg total) by mouth 3 (three) times daily as needed for dizziness., Disp:  30 tablet, Rfl: 0   ocrelizumab (OCREVUS) 300 MG/10ML injection, Inject 20 mLs (600 mg total) into the vein every 6 (six) months., Disp: 20 mL, Rfl: 1   traZODone (DESYREL) 100 MG tablet, 1/2 to 1 pill qHS, Disp: 30 tablet, Rfl: 11   acetaminophen-codeine (TYLENOL #3) 300-30 MG tablet, Take 1 tablet by mouth in the morning, at noon, and at bedtime., Disp:  90 tablet, Rfl: 5   albuterol (PROAIR HFA) 108 (90 Base) MCG/ACT inhaler, Inhale 2 puffs into the lungs every 4 (four) hours as needed for wheezing or shortness of breath., Disp: 17 g, Rfl: 0   amphetamine-dextroamphetamine (ADDERALL) 10 MG tablet, Take 1 tablet (10 mg total) by mouth 2 (two) times daily as needed., Disp: 60 tablet, Rfl: 0   escitalopram (LEXAPRO) 20 MG tablet, Take 1 tablet (20 mg total) by mouth daily., Disp: 90 tablet, Rfl: 0   triamcinolone ointment (KENALOG) 0.5 %, Apply 1 Application topically 2 (two) times daily. (Patient not taking: Reported on 09/11/2023), Disp: 30 g, Rfl: 0  PAST MEDICAL HISTORY: Past Medical History:  Diagnosis Date   Depression    no meds currently   MS (multiple sclerosis) (HCC)    SVD (spontaneous vaginal delivery)    x 5    PAST SURGICAL HISTORY: Past Surgical History:  Procedure Laterality Date   IR FLUORO GUIDE CV LINE RIGHT  07/16/2018   IR REMOVAL TUN CV CATH W/O FL  08/04/2018   IR US GUIDE VASC ACCESS RIGHT  07/16/2018   LAPAROSCOPIC TUBAL LIGATION Bilateral 01/06/2013   Procedure: LAPAROSCOPIC TUBAL LIGATION;  Surgeon: Kathreen Cosier, MD;  Location: [facility];  Service: Gynecology;  Laterality: Bilateral;   right hand surgery      FAMILY HISTORY: Family History  Problem Relation Age of Onset   Diabetes Mother    Heart disease Mother    Diabetes Father    Multiple sclerosis Cousin     SOCIAL HISTORY:  Social History   Socioeconomic History   Marital status: Married    Spouse name: Not on file   Number of children: 5   Years of education: Not on file   Highest  education level: Not on file  Occupational History   Occupation: Disability  Tobacco Use   Smoking status: Former    Current packs/day: 0.10    Types: Cigarettes   Smokeless tobacco: Never  Vaping Use   Vaping status: Never Used  Substance and Sexual Activity   Alcohol use: No    Alcohol/week: 0.0 standard drinks of alcohol   Drug use: No   Sexual activity: Yes    Birth control/protection: None  Other Topics Concern   Not on file  Social History Narrative   Not on file   Social Drivers of Health   Financial Resource Strain: High Risk (11/12/2022)   Overall Financial Resource Strain (CARDIA)    Difficulty of Paying Living Expenses: Hard  Food Insecurity: No Food Insecurity (11/12/2022)   Hunger Vital Sign    Worried About Running Out of Food in the Last Year: Never true    Ran Out of Food in the Last Year: Never true  Transportation Needs: No Transportation Needs (11/12/2022)   PRAPARE - Administrator, Civil Service (Medical): No    Lack of Transportation (Non-Medical): No  Physical Activity: Inactive (11/12/2022)   Exercise Vital Sign    Days of Exercise per Week: 0 days    Minutes of Exercise per Session: 0 min  Stress: Stress Concern Present (11/12/2022)   [employer] of Occupational Health - Occupational Stress Questionnaire    Feeling of Stress : Very much  Social Connections: Socially Integrated (11/12/2022)   Social Connection and Isolation Panel [NHANES]    Frequency of Communication with Friends and Family: More than three times a week    Frequency of Social Gatherings with Friends and Family: More than  three times a week    Attends Religious Services: More than 4 times per year    Active Member of Clubs or Organizations: Yes    Attends Banker Meetings: Never    Marital Status: Married  Catering manager Violence: Not At Risk (11/12/2022)   Humiliation, Afraid, Rape, and Kick questionnaire    Fear of Current or Ex-Partner: No    Emotionally  Abused: No    Physically Abused: No    Sexually Abused: No     PHYSICAL EXAM  Vitals:   09/11/23 0939  BP: 113/80  Pulse: 80  Weight: 220 lb 8 oz (100 kg)  Height: 5\' 7"  (1.702 m)    Body mass index is 34.54 kg/m.  No results found.    General: The patient is well-developed and well-nourished and in no acute distress.   She has no rashes.   She has left optic disc pallor    Neurologic Exam  Mental status: The patient is alert and oriented x 3 at the time of the examination. The patient has apparent normal recent and remote memory, with an apparently normal attention span and concentration ability.   Speech is normal.  Cranial nerves: Extraocular movements are full.  2+ left APD.   She is hand waving only OS and 20/20 OD.  Facial strength and sensation is normal. Trapezius and sternocleidomastoid strength is normal. No dysarthria is noted.  The tongue is midline, and the patient has symmetric elevation of the soft palate. No obvious hearing deficits are noted.  Motor:  Muscle bulk is normal.   Muscle tone is mildly increased on left leg.  Strength was 5/5 in the arms and the right leg in the proximal left leg but 4/5 in the left ankle and toe extensors  Sensory:   Vibration sensation and touch sensation is normal and symmetric in the arms bu mildly reduced vibration sensation in left leg. .  Coordination: Finger-nose-finger is normal.  Decreased coordination of the legs, worse on the left.  Gait and station: Station is normal. Gait shows mild left foot drop.   Gait is slightly wide and tendem is wide but improved form last visit. .  Romberg is negative.   Reflexes: Deep tendon reflexes are symmetric and normal bilaterally.           DIAGNOSTIC DATA (LABS, IMAGING, TESTING) - I reviewed patient records, labs, notes, testing and imaging myself where available.  Lab Results  Component Value Date   WBC 5.7 10/24/2022   HGB 10.7 (L) 10/24/2022   HCT 32.7 (L) 10/24/2022    MCV 85 10/24/2022   PLT 342 10/24/2022      Component Value Date/Time   NA 141 01/04/2020 1425   K 4.5 01/04/2020 1425   CL 106 01/04/2020 1425   CO2 23 01/04/2020 1425   GLUCOSE 76 01/04/2020 1425   GLUCOSE 101 (H) 05/04/2019 1900   BUN 9 01/04/2020 1425   CREATININE 0.79 01/04/2020 1425   CALCIUM 9.7 01/04/2020 1425   PROT 6.7 11/02/2020 1621   ALBUMIN 4.2 11/02/2020 1621   AST 16 11/02/2020 1621   ALT 20 11/02/2020 1621   ALKPHOS 72 11/02/2020 1621   BILITOT <0.2 11/02/2020 1621   GFRNONAA 98 01/04/2020 1425   GFRAA 113 01/04/2020 1425       ASSESSMENT AND PLAN    1. Relapsing remitting multiple sclerosis (HCC)   2. High risk medication use   3. Optic neuritis due to multiple sclerosis (HCC)  4. Depression, unspecified depression type   5. Insomnia, unspecified type   6. Gait disturbance       1.   Resume Ocrevus 600 mg every 6 months.  We will recheck blood work.   If we have difficulty with insurance close to try Briumvi 2.   Due to difficulty with gait, vision and cognition/fatigue, she is disabled. 3.   Trazodone nightly for insomnia (50-100 mg).  She gets a benefit from Tylenol 3 and this will be continued for her chronic pain. Return in 6 months or sooner if there are new or worsening neurologic symptoms.   This visit is part of a comprehensive longitudinal care medical relationship regarding the patients primary diagnosis of MS and related concerns.  Selden Noteboom A. Epimenio Foot, MD, PhD 09/11/2023, 11:20 AM Certified in Neurology, Clinical Neurophysiology, Sleep Medicine, Pain Medicine and Neuroimaging  [facility] [address], [address] [place], [place] [address] [phone]

## 2023-09-11 NOTE — Telephone Encounter (Signed)
Called pt. She reports household size 7, annual household income: 13,000. I added this to Ocrevus start form. Faxed complete/signed Ocrevus start form to [employer] at [phone]. Received fax confirmation.   Pt aware once lab results are back, we will fax referral/order to [facility] to try and get her set up with home infusion.

## 2023-09-17 LAB — CBC WITH DIFFERENTIAL/PLATELET
Basophils Absolute: 0 10*3/uL (ref 0.0–0.2)
Basos: 1 %
EOS (ABSOLUTE): 0.2 10*3/uL (ref 0.0–0.4)
Eos: 5 %
Hematocrit: 34.7 % (ref 34.0–46.6)
Hemoglobin: 11.1 g/dL (ref 11.1–15.9)
Immature Grans (Abs): 0 10*3/uL (ref 0.0–0.1)
Immature Granulocytes: 0 %
Lymphocytes Absolute: 1.3 10*3/uL (ref 0.7–3.1)
Lymphs: 29 %
MCH: 27.9 pg (ref 26.6–33.0)
MCHC: 32 g/dL (ref 31.5–35.7)
MCV: 87 fL (ref 79–97)
Monocytes Absolute: 0.4 10*3/uL (ref 0.1–0.9)
Monocytes: 8 %
Neutrophils Absolute: 2.6 10*3/uL (ref 1.4–7.0)
Neutrophils: 57 %
Platelets: 305 10*3/uL (ref 150–450)
RBC: 3.98 x10E6/uL (ref 3.77–5.28)
RDW: 14.6 % (ref 11.7–15.4)
WBC: 4.6 10*3/uL (ref 3.4–10.8)

## 2023-09-17 LAB — QUANTIFERON-TB GOLD PLUS
QuantiFERON Nil Value: 0.02 [IU]/mL
QuantiFERON TB1 Ag Value: 0.03 [IU]/mL
QuantiFERON TB2 Ag Value: 0.03 [IU]/mL

## 2023-09-17 LAB — IGG, IGA, IGM
IgA/Immunoglobulin A, Serum: 151 mg/dL (ref 87–352)
IgG (Immunoglobin G), Serum: 1046 mg/dL (ref 586–1602)
IgM (Immunoglobulin M), Srm: 57 mg/dL (ref 26–217)

## 2023-09-17 LAB — HEPATITIS B SURFACE ANTIGEN: Hepatitis B Surface Ag: NEGATIVE

## 2023-09-17 LAB — HEPATITIS B CORE ANTIBODY, TOTAL: Hep B Core Total Ab: NEGATIVE

## 2023-09-17 NOTE — Telephone Encounter (Addendum)
 Per Dr. Vear: Alicia Barker is fine to resume Briumvi or OCrevus .  I think I had her sign forms I called pt and updated her about lab results. She verbalized understanding.   I faxed Ocrevus  orders below to [facility] at [phone]. Received fax confirmation. Per Dr. Vear, she does not need loading dose.

## 2023-09-17 NOTE — Telephone Encounter (Signed)
 [facility] sent fax back wanting to confirm pt has no hx cancer. I called pt. She confirmed she has no history of cancer. I faxed this back to [facility] at [phone]. Received fax confirmation.

## 2023-09-24 NOTE — Telephone Encounter (Signed)
[facility] faxed denial letter from [facility]. Spoke w/ MD. We will appeal. Appeal letter faxed to [facility] appeals department at [phone]. Marked urgent, waiting on determination.   Faxed update to [facility] that we faxed appeal letter to insurance, waiting on determination.   If appeal denies, he would discuss switching patient to [facility] at that point.

## 2023-09-30 NOTE — Telephone Encounter (Signed)
Called [facility] at [phone] and spoke w/ Gerilyn Pilgrim. He transferred me to [facility] specialist. I relayed approval date info. He verbalized understanding and will proceed with getting pt set up.

## 2023-09-30 NOTE — Telephone Encounter (Signed)
Called and spoke to [employer] and they stated that the ocrevus was approved from2/10/25-12-31-25

## 2023-10-23 NOTE — Telephone Encounter (Signed)
 Called [facility] at [phone]. Spoke w/ Alicia Barker. Confirmed she received Ocrevus 10/14/23. Scheduled for next infusion 04/13/24.

## 2023-10-24 ENCOUNTER — Other Ambulatory Visit: Payer: Self-pay | Admitting: Neurology

## 2023-11-06 ENCOUNTER — Other Ambulatory Visit: Payer: Self-pay

## 2023-11-06 MED ORDER — TRAZODONE HCL 100 MG PO TABS: ORAL_TABLET | ORAL | 11 refills | Status: AC

## 2023-12-20 ENCOUNTER — Other Ambulatory Visit: Payer: Self-pay | Admitting: Neurology

## 2023-12-22 NOTE — Telephone Encounter (Signed)
 Last seen on  09/11/23 No 6 month follow up scheduled

## 2023-12-25 ENCOUNTER — Ambulatory Visit: Payer: 59

## 2023-12-25 VITALS — Wt 210.0 lb

## 2023-12-25 DIAGNOSIS — Z0000 Encounter for general adult medical examination without abnormal findings: Secondary | ICD-10-CM

## 2023-12-25 NOTE — Patient Instructions (Signed)
 Alicia Barker , Thank you for taking time out of your busy schedule to complete your Annual Wellness Visit with me. I enjoyed our conversation and look forward to speaking with you again next year. I, as well as your care team,  appreciate your ongoing commitment to your health goals. Please review the following plan we discussed and let me know if I can assist you in the future. Your Game plan/ To Do List    Referrals: If you haven't heard from the office you've been referred to, please reach out to them at the phone provided.   Follow up Visits: Next Medicare AWV with our clinical staff: 12/30/2024 at 11:10 a.m. PHONE VISIT with Nurse   Have you seen your provider in the last 6 months (3 months if uncontrolled diabetes)? No Next Office Visit with your provider: 01/27/2024 at 3:30 p.m. with DR. Clem Currier.  Clinician Recommendations:  Aim for 30 minutes of exercise or brisk walking, 6-8 glasses of water, and 5 servings of fruits and vegetables each day.       This is a list of the screening recommended for you and due dates:  Health Maintenance  Topic Date Due   COVID-19 Vaccine (2 - Pfizer risk series) 08/15/2020   Flu Shot  03/12/2024   Medicare Annual Wellness Visit  12/24/2024   Pap with HPV screening  06/21/2027   DTaP/Tdap/Td vaccine (2 - Td or Tdap) 07/26/2027   Hepatitis C Screening  Completed   HIV Screening  Completed   HPV Vaccine  Aged Out   Meningitis B Vaccine  Aged Out    Advanced directives: (Declined) Advance directive discussed with you today. Even though you declined this today, please call our office should you change your mind, and we can give you the proper paperwork for you to fill out. Advance Care Planning is important because it:  [x]  Makes sure you receive the medical care that is consistent with your values, goals, and preferences  [x]  It provides guidance to your family and loved ones and reduces their decisional burden about whether or not they are making  the right decisions based on your wishes.  Follow the link provided in your after visit summary or read over the paperwork we have mailed to you to help you started getting your Advance Directives in place. If you need assistance in completing these, please reach out to us  so that we can help you!  See attachments for Preventive Care and Fall Prevention Tips.

## 2023-12-25 NOTE — Progress Notes (Signed)
 Because this visit was a virtual/telehealth visit,  certain criteria was not obtained, such a blood pressure, CBG if applicable, and timed get up and go. Any medications not marked as "taking" were not mentioned during the medication reconciliation part of the visit. Any vitals not documented were not able to be obtained due to this being a telehealth visit or patient was unable to self-report a recent blood pressure reading due to a lack of equipment at home via telehealth. Vitals that have been documented are verbally provided by the patient.   Subjective:   Alicia Barker is a 38 y.o. who presents for a Medicare Wellness preventive visit.  As a reminder, Annual Wellness Visits don't include a physical exam, and some assessments may be limited, especially if this visit is performed virtually. We may recommend an in-person visit if needed.  Visit Complete: Virtual I connected with  Alicia Barker on 12/25/23 by a audio enabled telemedicine application and verified that I am speaking with the correct person using two identifiers.  Patient Location: Home  Provider Location: Office/Clinic  I discussed the limitations of evaluation and management by telemedicine. The patient expressed understanding and agreed to proceed.  Vital Signs: Because this visit was a virtual/telehealth visit, some criteria may be missing or patient reported. Any vitals not documented were not able to be obtained and vitals that have been documented are patient reported.  VideoDeclined- This patient declined Librarian, academic. Therefore the visit was completed with audio only.  Persons Participating in Visit: Patient.  AWV Questionnaire: No: Patient Medicare AWV questionnaire was not completed prior to this visit.  Cardiac Risk Factors include: sedentary lifestyle;family history of premature cardiovascular disease     Objective:     Today's Vitals   12/25/23 1116  Weight: 210 lb  (95.3 kg)  PainSc: 7   PainLoc: Generalized   Body mass index is 32.89 kg/m.     12/25/2023   11:20 AM 11/12/2022   11:22 AM 05/23/2022    1:51 PM 03/26/2021   10:22 AM 10/09/2020    3:57 PM 05/10/2019    8:56 AM 05/04/2019    3:55 PM  Advanced Directives  Does Patient Have a Medical Advance Directive? No No No No No No No  Would patient like information on creating a medical advance directive? No - Patient declined No - Patient declined No - Patient declined No - Patient declined No - Patient declined No - Patient declined No - Patient declined    Current Medications (verified) Outpatient Encounter Medications as of 12/25/2023  Medication Sig   acetaminophen -codeine  (TYLENOL  #3) 300-30 MG tablet Take 1 tablet by mouth in the morning, at noon, and at bedtime.   albuterol  (PROAIR  HFA) 108 (90 Base) MCG/ACT inhaler Inhale 2 puffs into the lungs every 4 (four) hours as needed for wheezing or shortness of breath.   amphetamine -dextroamphetamine  (ADDERALL) 10 MG tablet Take 1 tablet (10 mg total) by mouth 2 (two) times daily as needed.   escitalopram  (LEXAPRO ) 20 MG tablet TAKE 1 TABLET(20 MG) BY MOUTH DAILY   fluticasone  (FLONASE ) 50 MCG/ACT nasal spray Place 2 sprays into both nostrils daily.   gabapentin  (NEURONTIN ) 300 MG capsule Take 1 capsule (300 mg total) by mouth 2 (two) times daily.   meclizine  (ANTIVERT ) 25 MG tablet Take 1 tablet (25 mg total) by mouth 3 (three) times daily as needed for dizziness.   ocrelizumab  (OCREVUS ) 300 MG/10ML injection Inject 20 mLs (600 mg total) into the  vein every 6 (six) months.   traZODone  (DESYREL ) 100 MG tablet 1/2 to 1 pill qHS   triamcinolone  ointment (KENALOG ) 0.5 % Apply 1 Application topically 2 (two) times daily. (Patient not taking: Reported on 09/11/2023)   No facility-administered encounter medications on file as of 12/25/2023.    Allergies (verified) Penicillins and Zoloft  [sertraline  hcl]   History: Past Medical History:  Diagnosis  Date   Depression    no meds currently   MS (multiple sclerosis) (HCC)    SVD (spontaneous vaginal delivery)    x 5   Past Surgical History:  Procedure Laterality Date   IR FLUORO GUIDE CV LINE RIGHT  07/16/2018   IR REMOVAL TUN CV CATH W/O FL  08/04/2018   IR US  GUIDE VASC ACCESS RIGHT  07/16/2018   LAPAROSCOPIC TUBAL LIGATION Bilateral 01/06/2013   Procedure: LAPAROSCOPIC TUBAL LIGATION;  Surgeon: Heide Livings, MD;  Location: [facility];  Service: Gynecology;  Laterality: Bilateral;   right hand surgery     Family History  Problem Relation Age of Onset   Diabetes Mother    Heart disease Mother    Diabetes Father    Multiple sclerosis Cousin    Social History   Socioeconomic History   Marital status: Married    Spouse name: Not on file   Number of children: 5   Years of education: Not on file   Highest education level: Not on file  Occupational History   Occupation: Disability  Tobacco Use   Smoking status: Former    Current packs/day: 0.10    Types: Cigarettes   Smokeless tobacco: Never  Vaping Use   Vaping status: Never Used  Substance and Sexual Activity   Alcohol use: No    Alcohol/week: 0.0 standard drinks of alcohol   Drug use: No   Sexual activity: Yes    Birth control/protection: None  Other Topics Concern   Not on file  Social History Narrative   Not on file   Social Drivers of Health   Financial Resource Strain: Low Risk  (12/25/2023)   Overall Financial Resource Strain (CARDIA)    Difficulty of Paying Living Expenses: Not very hard  Food Insecurity: No Food Insecurity (12/25/2023)   Hunger Vital Sign    Worried About Running Out of Food in the Last Year: Never true    Ran Out of Food in the Last Year: Never true  Transportation Needs: No Transportation Needs (12/25/2023)   PRAPARE - Administrator, Civil Service (Medical): No    Lack of Transportation (Non-Medical): No  Physical Activity: Inactive (12/25/2023)   Exercise Vital Sign     Days of Exercise per Week: 0 days    Minutes of Exercise per Session: 0 min  Stress: Stress Concern Present (12/25/2023)   [employer] of Occupational Health - Occupational Stress Questionnaire    Feeling of Stress : Very much  Social Connections: Socially Integrated (12/25/2023)   Social Connection and Isolation Panel [NHANES]    Frequency of Communication with Friends and Family: More than three times a week    Frequency of Social Gatherings with Friends and Family: More than three times a week    Attends Religious Services: More than 4 times per year    Active Member of [employer] or Organizations: Yes    Attends Banker Meetings: Never    Marital Status: Married    Tobacco Counseling Counseling given: Not Answered    Clinical Intake:  Pre-visit preparation completed:  Yes  Pain : 0-10 Pain Score: 7  Pain Type: Chronic pain Pain Location: Generalized     BMI - recorded: 32.89 Nutritional Status: BMI > 30  Obese Nutritional Risks: None Diabetes: No  Lab Results  Component Value Date   HGBA1C 5.3 02/22/2016     How often do you need to have someone help you when you read instructions, pamphlets, or other written materials from your doctor or pharmacy?: 1 - Never What is the last grade level you completed in school?: HSG  Interpreter Needed?: No  Information entered by :: Alicia Gerhard, Alicia Barker.   Activities of Daily Living     12/25/2023   11:24 AM  In your present state of health, do you have any difficulty performing the following activities:  Hearing? 1  Vision? 1  Difficulty concentrating or making decisions? 1  Walking or climbing stairs? 1  Dressing or bathing? 0  Doing errands, shopping? 0  Preparing Food and eating ? N  Using the Toilet? N  In the past six months, have you accidently leaked urine? Y  Do you have problems with loss of bowel control? Y  Managing your Medications? N  Managing your Finances? N  Housekeeping or managing  your Housekeeping? Y    Patient Care Team: Clem Currier, DO as PCP - General (Family Medicine) Lorena Rolling, MD as Consulting Physician (Ophthalmology)  Indicate any recent Medical Services you may have received from other than [facility] providers in the past year (date may be approximate).     Assessment:    This is a routine wellness examination for Alicia Barker.  Hearing/Vision screen Hearing Screening - Comments:: Patient stated that she has some hearing difficulties with the left ear.  No hearing aids at this time.  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  Patient wears eyeglasses and up to date with eye exams with Dr. Ethyl Hering.   Goals Addressed               This Visit's Progress     Patient Stated (pt-stated)        12/25/23: To get back in shape and lose weight.       Depression Screen     12/25/2023   11:21 AM 11/12/2022   11:05 AM 05/23/2022    1:51 PM 03/26/2021   10:24 AM 10/09/2020    3:56 PM 06/11/2019    2:00 PM 05/10/2019    8:56 AM  PHQ 2/9 Scores  PHQ - 2 Score 1 1 2 6 4  0 0  PHQ- 9 Score 10  15 24 22       Fall Risk     12/25/2023   11:20 AM 11/12/2022   11:05 AM 05/10/2019    8:56 AM 05/04/2019    3:55 PM 07/25/2017   10:04 AM  Fall Risk   Falls in the past year? 1 1 0 0 Yes  Number falls in past yr: 1 1 0 0   Injury with Fall? 0 1   No  Risk for fall due to : History of fall(s);Impaired balance/gait;Orthopedic patient Impaired balance/gait     Follow up Falls prevention discussed;Falls evaluation completed Falls evaluation completed       MEDICARE RISK AT HOME:  Medicare Risk at Home Any stairs in or around the home?: No If so, are there any without handrails?: No Home free of loose throw rugs in walkways, pet beds, electrical cords, etc?: Yes Adequate  lighting in your home to reduce risk of falls?: Yes Life alert?: No Use of a cane, walker or w/c?: Yes Grab bars in the bathroom?:  Yes Shower chair or bench in shower?: Yes Elevated toilet seat or a handicapped toilet?: Yes  TIMED UP AND GO:  Was the test performed?  No  Cognitive Function: 6CIT completed    12/25/2023   11:21 AM  MMSE - Mini Mental State Exam  Not completed: Unable to complete        12/25/2023   11:28 AM 11/12/2022   11:12 AM  6CIT Screen  What Year? 0 points 0 points  What month? 0 points 0 points  What time? 3 points 0 points  Count back from 20 0 points 0 points  Months in reverse 0 points 0 points  Repeat phrase 0 points 10 points  Total Score 3 points 10 points    Immunizations Immunization History  Administered Date(s) Administered   Influenza,inj,Quad PF,6+ Mos 07/25/2017, 05/10/2019, 10/09/2020   PFIZER Comirnaty(Gray Top)Covid-19 Tri-Sucrose Vaccine 07/25/2020   Tdap 07/25/2017    Screening Tests Health Maintenance  Topic Date Due   COVID-19 Vaccine (2 - Pfizer risk series) 08/15/2020   INFLUENZA VACCINE  03/12/2024   Medicare Annual Wellness (AWV)  12/24/2024   Cervical Cancer Screening (HPV/Pap Cotest)  06/21/2027   DTaP/Tdap/Td (2 - Td or Tdap) 07/26/2027   Hepatitis C Screening  Completed   HIV Screening  Completed   HPV VACCINES  Aged Out   Meningococcal B Vaccine  Aged Out    Health Maintenance  Health Maintenance Due  Topic Date Due   COVID-19 Vaccine (2 - Pfizer risk series) 08/15/2020   Health Maintenance Items Addressed: Yes    Additional Screening:  Vision Screening: Recommended annual ophthalmology exams for early detection of glaucoma and other disorders of the eye.  Dental Screening: Recommended annual dental exams for proper oral hygiene  Community Resource Referral / Chronic Care Management: CRR required this visit?  No   CCM required this visit?  No   Plan:    I have personally reviewed and noted the following in the patient's chart:   Medical and social history Use of alcohol, tobacco or illicit drugs  Current medications  and supplements including opioid prescriptions. Patient is not currently taking opioid prescriptions. Functional ability and status Nutritional status Physical activity Advanced directives List of other physicians Hospitalizations, surgeries, and ER visits in previous 12 months Vitals Screenings to include cognitive, depression, and falls Referrals and appointments  In addition, I have reviewed and discussed with patient certain preventive protocols, quality metrics, and best practice recommendations. A written personalized care plan for preventive services as well as general preventive health recommendations were provided to patient.   Alicia Sheldon, Alicia Barker   [id number]/1191   After Visit Summary: (MyChart) Due to this being a telephonic visit, the after visit summary with patients personalized plan was offered to patient via MyChart   Notes: Patient is scheduled to see Clem Currier, DO on 01/27/2024 to be evaluated for depression and follow up.  Patient stated that she is no having any suicide ideations.

## 2024-01-20 ENCOUNTER — Other Ambulatory Visit: Payer: Self-pay | Admitting: Neurology

## 2024-01-20 NOTE — Telephone Encounter (Signed)
 Last seen on 09/11/23 No 6 month follow up scheduled ( my chart message sent to schedule an updated visit)  I don't see you mentioned pt was to continue medication.

## 2024-01-27 ENCOUNTER — Ambulatory Visit: Admitting: Student

## 2024-03-15 ENCOUNTER — Other Ambulatory Visit: Payer: Self-pay | Admitting: Neurology

## 2024-03-15 NOTE — Telephone Encounter (Signed)
 Last seen on 09/11/23 Follow up scheduled on 06/25/24  Dispensed Days Supply Quantity Provider Pharmacy  ACETAMINOPHEN /COD #3 (300/30MG ) TAB 02/10/2024 30 90 each Sater, Charlie LABOR, MD [facility]   Rx pending to be signed

## 2024-03-30 ENCOUNTER — Other Ambulatory Visit: Payer: Self-pay | Admitting: Neurology

## 2024-03-30 MED ORDER — AMPHETAMINE-DEXTROAMPHETAMINE 10 MG PO TABS: 10.0000 mg | ORAL_TABLET | Freq: Two times a day (BID) | ORAL | 0 refills | Status: AC | PRN

## 2024-03-30 NOTE — Telephone Encounter (Signed)
 Requested Prescriptions   Pending Prescriptions Disp Refills   amphetamine -dextroamphetamine  (ADDERALL) 10 MG tablet 60 tablet 0    Sig: Take 1 tablet (10 mg total) by mouth 2 (two) times daily as needed.   Pt Last Seen 09/11/2023 (Dr. Vear) Upcoming Appointment 06/25/2024 (NP, Harlene Bogaert)  Dispenses   Dispensed Days Supply Quantity Provider Pharmacy  D-AMPHETAMINE  SALT COMBO 10MG  TAB 09/23/2023 30 60 each Sater, Charlie LABOR, MD [facility].[id number]

## 2024-03-30 NOTE — Telephone Encounter (Signed)
 Pt is needing a refill request for her amphetamine -dextroamphetamine  (ADDERALL) 10 MG tablet sent in to the [facility] in [place]

## 2024-03-30 NOTE — Telephone Encounter (Signed)
 Pt Last Seen 09/11/2023 (Dr. Vear) Upcoming Appointment 06/25/2024 (NP, Harlene Bogaert)   Adderall Last filled 09/23/2023

## 2024-05-03 ENCOUNTER — Encounter: Payer: Self-pay | Admitting: Adult Health

## 2024-05-03 ENCOUNTER — Telehealth: Payer: Self-pay | Admitting: Adult Health

## 2024-05-03 ENCOUNTER — Ambulatory Visit: Admitting: Adult Health

## 2024-05-03 NOTE — Progress Notes (Deleted)
 [facility]  PATIENT: Alicia Barker DOB: 1986-05-31  REFERRING DOCTOR OR PCP:  PCP is Aida Na SOURCE: patient, records from hospital, MRI / lab reports, MRI images on PACS  _________________________________  - HISTORICAL  CHIEF COMPLAINT:  No chief complaint on file.   HISTORY OF PRESENT ILLNESS:  Alicia Barker is a 38 y.o. woman who was diagnosed with MS in March 2017.     Update 05/03/2024 JM: Previously seen by Dr. Vear 09/11/2023. she is on Ocrevus  and her last infusion was in ***. The last infusion was 12/18/2020 (300 mg) and she no-showed the rescheduled 5/24 and 5/31 visits.     Her last exacerbation with optic neuritis was in July 2020.    She was seen at OSH back in February for concern of MS flare with left-sided numbness after becoming overheated.  CT head no acute abnormality.  She was prescribed 10-day course of prednisone  and was referred to local neurology clinic in [place], [place].     Her gait is stable but her left leg gives her problems.  Sometimes she drags it - esp if she goes longer distance.  She has numbness and pins/needles tingling in hands and feet.   She uses the bannister on stairs.      She reports light perception/large moving shape only vision out of the left eye.  No color vision OS.  She can read easily out of the right eye.  Due to urinary urge incontinence, she wears pads.    She has poor sleep most nights with both sleep maintenance and sleep onset insomnia.  Trazodone  help sleep maintenance more than onset.    She has fatigue.    She notes some depression.    She notes mild memory issues and diffciulty coming up with the right words at times    Lexapr ohas helped anxiety > depression  She reports back and neck pain.  Hands hurt at times, esp with use.      MS History:    In early March, 2017, she had the onset of slurred speech and leg weakness and ataxia.    A head CT was reportedly normal.    Over the next  couple weeks, she had right worse than left visual acuity issues, more numbness and more clumsiness.        Her gait became unsteady.     She also noted mid back pain.    She went to the ER and was diagnosed with an inner ear problem.   She went back to ER 11/15/15 and was found to have an abnormal MRI consistent with MS.    MRI of the spine also showed additional plaques.     She received 5 days of IV Steroids and noticed an improvement with improved gait but continued numbness.    I have reviewed the MRIs of the brain and spine performed for 12/30/2015 and 11/17/2015. The MRI of the brain with and without contrast shows multiple T2/FLAIR hyperintense foci, many in the periventricular white matter.    Another focus is the pons (with mild enhancement, better seen on c-spine MRI).SABRA 7 foci enhanced after gadolinium administration imply more recent MS plaques.    There are foci at C2 and C3 on cervical spine MRI and the focus at C2 had slight enhancement.    Another enhancing focus is noted within the thoracic spine adjacent to T3-T4.   She started Tecfidera  after I first saw her 11/28/2015.   She  had a significant relapse and was switched over to ocrelizumab  if her first dose around January or February 2018.    She had an exacerbation with severe left ON in 06/2018 and possibly mild right ON .02/2019.                                                                                     Imaging: MRI brain 06/02/2021 showed T2/flair hyperintense foci in the hemispheres, right middle cerebellar peduncle and right pons in a pattern and configuration consistent with chronic demyelinating plaque associated with multiple sclerosis.  None of the foci appear to be acute and they do not enhance.  Compared to the MRI dated 07/10/2018, there do not appear to be any new lesions.    REVIEW OF SYSTEMS: Constitutional: No fevers, chills, sweats, or change in appetite.  She reports fatigue and poor sleep  Eyes: mild right visual  changes, double vision, eye pain Ear, nose and throat: No hearing loss, ear pain, nasal congestion, sore throat Cardiovascular: No chest pain, palpitations Respiratory:  No shortness of breath at rest or with exertion.   No wheezes GastrointestinaI: No nausea, vomiting, diarrhea, abdominal pain, fecal incontinence Genitourinary:  No dysuria, urinary retention.   She has some urgency and frequency..  She has some nocturia. Musculoskeletal:  Reports neck pain, back pain Integumentary: No rash, pruritus, skin lesions Neurological: as above Psychiatric: Notes depression > anxiety.    Endocrine: No palpitations, diaphoresis, change in appetite, change in weigh or increased thirst Hematologic/Lymphatic:  No anemia, purpura, petechiae. Allergic/Immunologic: No itchy/runny eyes, nasal congestion, recent allergic reactions, rashes  ALLERGIES: Allergies  Allergen Reactions   Penicillins Anaphylaxis, Shortness Of Breath and Swelling    Has patient had a PCN reaction causing immediate rash, facial/tongue/throat swelling, SOB or lightheadedness with hypotension: YES Has patient had a PCN reaction causing severe rash involving mucus membranes or skin necrosis: NO Has patient had a PCN reaction that required hospitalization NO Has patient had a PCN reaction occurring within the last 10 years: NO If all of the above answers are NO, then may proceed with Cephalosporin use.   Zoloft  [Sertraline  Hcl]     Jittery per pt    HOME MEDICATIONS:  Current Outpatient Medications:    acetaminophen -codeine  (TYLENOL  #3) 300-30 MG tablet, Take 1 tablet by mouth every 8 (eight) hours as needed for moderate pain (pain score 4-6)., Disp: 90 tablet, Rfl: 5   albuterol  (PROAIR  HFA) 108 (90 Base) MCG/ACT inhaler, Inhale 2 puffs into the lungs every 4 (four) hours as needed for wheezing or shortness of breath., Disp: 17 g, Rfl: 0   amphetamine -dextroamphetamine  (ADDERALL) 10 MG tablet, Take 1 tablet (10 mg total) by  mouth 2 (two) times daily as needed., Disp: 60 tablet, Rfl: 0   escitalopram  (LEXAPRO ) 20 MG tablet, TAKE 1 TABLET(20 MG) BY MOUTH DAILY, Disp: 30 tablet, Rfl: 0   fluticasone  (FLONASE ) 50 MCG/ACT nasal spray, Place 2 sprays into both nostrils daily., Disp: 16 g, Rfl: 6   gabapentin  (NEURONTIN ) 300 MG capsule, Take 1 capsule (300 mg total) by mouth 2 (two) times daily., Disp: 30 capsule, Rfl: 0   meclizine  (ANTIVERT ) 25 MG  tablet, Take 1 tablet (25 mg total) by mouth 3 (three) times daily as needed for dizziness., Disp: 30 tablet, Rfl: 0   ocrelizumab  (OCREVUS ) 300 MG/10ML injection, Inject 20 mLs (600 mg total) into the vein every 6 (six) months., Disp: 20 mL, Rfl: 1   traZODone  (DESYREL ) 100 MG tablet, 1/2 to 1 pill qHS, Disp: 30 tablet, Rfl: 11   triamcinolone  ointment (KENALOG ) 0.5 %, Apply 1 Application topically 2 (two) times daily. (Patient not taking: Reported on 09/11/2023), Disp: 30 g, Rfl: 0  PAST MEDICAL HISTORY: Past Medical History:  Diagnosis Date   Depression    no meds currently   MS (multiple sclerosis) (HCC)    SVD (spontaneous vaginal delivery)    x 5    PAST SURGICAL HISTORY: Past Surgical History:  Procedure Laterality Date   IR FLUORO GUIDE CV LINE RIGHT  07/16/2018   IR REMOVAL TUN CV CATH W/O FL  08/04/2018   IR US  GUIDE VASC ACCESS RIGHT  07/16/2018   LAPAROSCOPIC TUBAL LIGATION Bilateral 01/06/2013   Procedure: LAPAROSCOPIC TUBAL LIGATION;  Surgeon: Aida DELENA Na, MD;  Location: [facility];  Service: Gynecology;  Laterality: Bilateral;   right hand surgery      FAMILY HISTORY: Family History  Problem Relation Age of Onset   Diabetes Mother    Heart disease Mother    Diabetes Father    Multiple sclerosis Cousin     SOCIAL HISTORY:  Social History   Socioeconomic History   Marital status: Married    Spouse name: Not on file   Number of children: 5   Years of education: Not on file   Highest education level: Not on file  Occupational History    Occupation: Disability  Tobacco Use   Smoking status: Former    Current packs/day: 0.10    Types: Cigarettes   Smokeless tobacco: Never  Vaping Use   Vaping status: Never Used  Substance and Sexual Activity   Alcohol use: No    Alcohol/week: 0.0 standard drinks of alcohol   Drug use: No   Sexual activity: Yes    Birth control/protection: None  Other Topics Concern   Not on file  Social History Narrative   Not on file   Social Drivers of Health   Financial Resource Strain: Low Risk  (12/25/2023)   Overall Financial Resource Strain (CARDIA)    Difficulty of Paying Living Expenses: Not very hard  Food Insecurity: No Food Insecurity (12/25/2023)   Hunger Vital Sign    Worried About Running Out of Food in the Last Year: Never true    Ran Out of Food in the Last Year: Never true  Transportation Needs: No Transportation Needs (12/25/2023)   PRAPARE - Administrator, Civil Service (Medical): No    Lack of Transportation (Non-Medical): No  Physical Activity: Inactive (12/25/2023)   Exercise Vital Sign    Days of Exercise per Week: 0 days    Minutes of Exercise per Session: 0 min  Stress: Stress Concern Present (12/25/2023)   [employer] of Occupational Health - Occupational Stress Questionnaire    Feeling of Stress : Very much  Social Connections: Socially Integrated (12/25/2023)   Social Connection and Isolation Panel    Frequency of Communication with Friends and Family: More than three times a week    Frequency of Social Gatherings with Friends and Family: More than three times a week    Attends Religious Services: More than 4 times per year  Active Member of Clubs or Organizations: Yes    Attends Banker Meetings: Never    Marital Status: Married  Catering manager Violence: Not At Risk (12/25/2023)   Humiliation, Afraid, Rape, and Kick questionnaire    Fear of Current or Ex-Partner: No    Emotionally Abused: No    Physically Abused: No     Sexually Abused: No     PHYSICAL EXAM  There were no vitals filed for this visit.   There is no height or weight on file to calculate BMI.  No results found.    General: The patient is well-developed and well-nourished and in no acute distress.   She has no rashes.   She has left optic disc pallor    Neurologic Exam  Mental status: The patient is alert and oriented x 3 at the time of the examination. The patient has apparent normal recent and remote memory, with an apparently normal attention span and concentration ability.   Speech is normal.  Cranial nerves: Extraocular movements are full.  2+ left APD.   She is hand waving only OS and 20/20 OD.  Facial strength and sensation is normal. Trapezius and sternocleidomastoid strength is normal. No dysarthria is noted.  The tongue is midline, and the patient has symmetric elevation of the soft palate. No obvious hearing deficits are noted.  Motor:  Muscle bulk is normal.   Muscle tone is mildly increased on left leg.  Strength was 5/5 in the arms and the right leg in the proximal left leg but 4/5 in the left ankle and toe extensors  Sensory:   Vibration sensation and touch sensation is normal and symmetric in the arms bu mildly reduced vibration sensation in left leg. .  Coordination: Finger-nose-finger is normal.  Decreased coordination of the legs, worse on the left.  Gait and station: Station is normal. Gait shows mild left foot drop.   Gait is slightly wide and tendem is wide but improved form last visit. .  Romberg is negative.   Reflexes: Deep tendon reflexes are symmetric and normal bilaterally.           DIAGNOSTIC DATA (LABS, IMAGING, TESTING) - I reviewed patient records, labs, notes, testing and imaging myself where available.  Lab Results  Component Value Date   WBC 4.6 09/11/2023   HGB 11.1 09/11/2023   HCT 34.7 09/11/2023   MCV 87 09/11/2023   PLT 305 09/11/2023      Component Value Date/Time   NA 141  01/04/2020 1425   K 4.5 01/04/2020 1425   CL 106 01/04/2020 1425   CO2 23 01/04/2020 1425   GLUCOSE 76 01/04/2020 1425   GLUCOSE 101 (H) 05/04/2019 1900   BUN 9 01/04/2020 1425   CREATININE 0.79 01/04/2020 1425   CALCIUM  9.7 01/04/2020 1425   PROT 6.7 11/02/2020 1621   ALBUMIN  4.2 11/02/2020 1621   AST 16 11/02/2020 1621   ALT 20 11/02/2020 1621   ALKPHOS 72 11/02/2020 1621   BILITOT <0.2 11/02/2020 1621   GFRNONAA 98 01/04/2020 1425   GFRAA 113 01/04/2020 1425       ASSESSMENT AND PLAN    No diagnosis found.     1.   Resume Ocrevus  600 mg every 6 months.  We will recheck blood work.   If we have difficulty with insurance close to try Briumvi 2.   Due to difficulty with gait, vision and cognition/fatigue, she is disabled. 3.   Trazodone  nightly for insomnia (50-100 mg).  She gets a benefit from Tylenol  3 and this will be continued for her chronic pain. Return in 6 months or sooner if there are new or worsening neurologic symptoms.      I personally spent a total of *** minutes in the care of the patient today including {Time Based Coding:210964241}.  Harlene Bogaert, AGNP-BC  [facility] [address] [place], [place] [address]  Phone [phone] Fax [phone] Note: This document was prepared with digital dictation and possible smart phrase technology. Any transcriptional errors that result from this process are unintentional.

## 2024-05-03 NOTE — Telephone Encounter (Signed)
 Patient reschedule appointment due to having car trouble.

## 2024-05-11 ENCOUNTER — Telehealth: Payer: Self-pay | Admitting: *Deleted

## 2024-05-11 NOTE — Telephone Encounter (Signed)
 Received fax from [facility] requesting updated orders for pt Ocrevus  infusion. Pt last seen 09/11/23. No showed appt w/ Jessica M,NP 05/03/24. Next appt scheduled for 11/24/24.   I spoke w/ Dr. Vear who stated this was too far out. Needs to be seen for updated visit/labs to make sure it is safe for her to continue on infusions before signing order. OK to work in on a Monday afternoon per Dr. Vear.   Called pt at [phone]. LVM. Called [phone]. LVM. Called [phone] on DPR, got busy signal.  [facility] [place] (on [place]) at [phone]. LVM.  Called [phone].  Received automated message that call could not be completed.

## 2024-05-12 NOTE — Telephone Encounter (Signed)
 Phone room: please try to call pt and offer appt on hold 05/28/24 at 2:30pm with Dr. Vear

## 2024-05-13 NOTE — Telephone Encounter (Signed)
 Noted

## 2024-06-03 ENCOUNTER — Telehealth: Payer: Self-pay | Admitting: Neurology

## 2024-06-03 NOTE — Telephone Encounter (Signed)
 MYC conf

## 2024-06-07 ENCOUNTER — Ambulatory Visit: Admitting: Neurology

## 2024-06-07 ENCOUNTER — Encounter: Payer: Self-pay | Admitting: Neurology

## 2024-06-25 ENCOUNTER — Ambulatory Visit: Admitting: Adult Health

## 2024-09-15 ENCOUNTER — Other Ambulatory Visit: Payer: Self-pay | Admitting: Neurology

## 2024-09-15 NOTE — Telephone Encounter (Signed)
 ROUTING TO PHONE ROOM: pt needs to schedule appt for refill Last seen 09/11/23 Next office visit not scheduled Dispenses   Dispensed Days Supply Quantity Provider Pharmacy  ACETAMINOPHEN /COD #3 (300/30MG ) TAB 08/16/2024 30 90 each Sater, Charlie LABOR, MD [facility]...  ACETAMINOPHEN /COD #3 (300/30MG ) TAB 07/15/2024 30 90 each Sater, Charlie LABOR, MD [facility] #...  ACETAMINOPHEN /COD #3 (300/30MG ) TAB

## 2024-11-24 ENCOUNTER — Ambulatory Visit: Admitting: Adult Health

## 2024-12-30 ENCOUNTER — Encounter
